# Patient Record
Sex: Female | Born: 1990 | Race: Black or African American | Hispanic: No | Marital: Single | Smoking: Never smoker
Health system: Southern US, Community
[De-identification: ages and names within clinical notes are randomized; demographics above are authoritative.]

## PROBLEM LIST (undated history)

## (undated) ENCOUNTER — Inpatient Hospital Stay (HOSPITAL_COMMUNITY): Payer: Self-pay

## (undated) DIAGNOSIS — D509 Iron deficiency anemia, unspecified: Secondary | ICD-10-CM

## (undated) DIAGNOSIS — R519 Headache, unspecified: Secondary | ICD-10-CM

## (undated) DIAGNOSIS — Z789 Other specified health status: Secondary | ICD-10-CM

## (undated) HISTORY — PX: NO PAST SURGERIES: SHX2092

---

## 2011-08-20 NOTE — L&D Delivery Note (Signed)
Delivery Note At 7:21 AM a viable and healthy female was delivered via Vaginal, Spontaneous Delivery (Presentation: ROA ).  APGAR: 9, 9; weight pending .   Placenta status: Intact, Spontaneous.  Cord:  with the following complications: .  Cord pH: n/a  Anesthesia: None  Episiotomy: None Lacerations: None Suture Repair: n/a Est. Blood Loss (mL): 250  Mom to postpartum.  Baby to nursery-stable.  [facility] 06/01/2012, 7:35 AM

## 2011-11-22 ENCOUNTER — Emergency Department (HOSPITAL_COMMUNITY)
Admission: EM | Admit: 2011-11-22 | Discharge: 2011-11-22 | Disposition: A | Discharge status: Home / Self Care | Payer: Medicaid Other | Attending: Emergency Medicine | Admitting: Emergency Medicine

## 2011-11-22 ENCOUNTER — Encounter (HOSPITAL_COMMUNITY): Payer: Self-pay | Admitting: *Deleted

## 2011-11-22 DIAGNOSIS — O2390 Unspecified genitourinary tract infection in pregnancy, unspecified trimester: Secondary | ICD-10-CM | POA: Insufficient documentation

## 2011-11-22 DIAGNOSIS — N390 Urinary tract infection, site not specified: Secondary | ICD-10-CM | POA: Insufficient documentation

## 2011-11-22 DIAGNOSIS — R112 Nausea with vomiting, unspecified: Secondary | ICD-10-CM | POA: Insufficient documentation

## 2011-11-22 LAB — URINE MICROSCOPIC-ADD ON

## 2011-11-22 LAB — URINALYSIS, ROUTINE W REFLEX MICROSCOPIC
Bilirubin Urine: NEGATIVE
Glucose, UA: NEGATIVE mg/dL
Hgb urine dipstick: NEGATIVE
Ketones, ur: NEGATIVE mg/dL
Nitrite: NEGATIVE
Protein, ur: NEGATIVE mg/dL
Specific Gravity, Urine: 1.014 (ref 1.005–1.030)
Urobilinogen, UA: 0.2 mg/dL (ref 0.0–1.0)
pH: 7.5 (ref 5.0–8.0)

## 2011-11-22 LAB — POCT PREGNANCY, URINE: Preg Test, Ur: POSITIVE — AB

## 2011-11-22 MED ORDER — NITROFURANTOIN MONOHYD MACRO 100 MG PO CAPS: 100.0000 mg | ORAL_CAPSULE | Freq: Two times a day (BID) | ORAL | Status: AC

## 2011-11-22 MED ORDER — ONDANSETRON 8 MG PO TBDP: 8.0000 mg | ORAL_TABLET | Freq: Three times a day (TID) | ORAL | Status: AC | PRN

## 2011-11-22 MED ORDER — SODIUM CHLORIDE 0.9 % IV SOLN
Freq: Once | INTRAVENOUS | Status: AC
  Administered 2011-11-22: 19:00:00 via INTRAVENOUS

## 2011-11-22 MED ORDER — ONDANSETRON HCL 4 MG/2ML IJ SOLN
4.0000 mg | Freq: Once | INTRAMUSCULAR | Status: AC
  Administered 2011-11-22: 4 mg via INTRAVENOUS
  Filled 2011-11-22: qty 2

## 2011-11-22 NOTE — ED Notes (Signed)
Nausea/vomiting since last night. No diarrhea

## 2011-11-22 NOTE — ED Notes (Signed)
Ns bolus started.

## 2011-11-22 NOTE — ED Notes (Signed)
Pt tolerated ginger ale with no nausea.  States she is ready to  Go home.

## 2011-11-22 NOTE — ED Provider Notes (Signed)
History     CSN: [id number]  Arrival date & time 11/22/11  1623   None     Chief Complaint  Patient presents with  . Emesis    (Consider location/radiation/quality/duration/timing/severity/associated sxs/prior treatment) HPI Comments: Patient reports that she is currently [redacted] weeks pregnant.  She comes in today with nausea and vomiting that began last evening. She denies diarrhea.  Denies any blood in her emesis or stool.  Her pregnancy has been uncomplicated.  She has an appointment scheduled with Gynecology on 11/25/11.  She is G2P1.  Her last pregnancy was uncomplicated.  She denies any abdominal pain, vaginal bleeding, leakage of fluid, or passage of vaginal tissue.  The history is provided by the patient.    Past Medical History  Diagnosis Date  . Pregnant state, incidental     History reviewed. No pertinent past surgical history.  History reviewed. No pertinent family history.  History  Substance Use Topics  . Smoking status: Never Smoker   . Smokeless tobacco: Not on file  . Alcohol Use: No    OB History    Grav Para Term Preterm Abortions TAB SAB Ect Mult Living                  Review of Systems  Constitutional: Negative for fever and chills.  Gastrointestinal: Positive for nausea and vomiting. Negative for abdominal pain, diarrhea and constipation.  Genitourinary: Negative for dysuria, frequency, hematuria, decreased urine volume, vaginal bleeding, vaginal discharge, vaginal pain and pelvic pain.    Allergies  Review of patient's allergies indicates no known allergies.  Home Medications   Current Outpatient Rx  Name Route Sig Dispense Refill  . PRENATAL VITAMIN PO Oral Take 1 tablet by mouth daily.      BP 129/72  Pulse 98  Temp(Src) 98.3 F (36.8 C) (Oral)  Resp 14  SpO2 100%  LMP 09/07/2011  Physical Exam  Nursing note and vitals reviewed. Constitutional: She appears well-developed and well-nourished. No distress.  HENT:  Head:  Normocephalic and atraumatic.  Cardiovascular: Normal rate, regular rhythm and normal heart sounds.   Pulmonary/Chest: Effort normal and breath sounds normal.  Abdominal: Soft. Bowel sounds are normal. She exhibits no distension and no mass. There is no tenderness. There is no rebound, no guarding and no CVA tenderness.  Neurological: She is alert.  Skin: Skin is warm and dry. She is not diaphoretic.    ED Course  Procedures (including critical care time)  Labs Reviewed  URINALYSIS, ROUTINE W REFLEX MICROSCOPIC - Abnormal; Notable for the following:    APPearance CLOUDY (*)    Leukocytes, UA LARGE (*)    All other components within normal limits  POCT PREGNANCY, URINE - Abnormal; Notable for the following:    Preg Test, Ur POSITIVE (*)    All other components within normal limits  URINE MICROSCOPIC-ADD ON - Abnormal; Notable for the following:    Squamous Epithelial / LPF MANY (*)    Bacteria, UA MANY (*)    All other components within normal limits   No results found.   No diagnosis found.  7:55 PM Patient able to tolerate po liquids after zofran administration.  MDM  Patient eleven weeks pregnant as evidenced by LMP comes in today with nausea and vomiting.  Patient able to tolerate po liquids after given Zofran.  No vaginal bleeding, vaginal discharge, abdominal pain, passage of tissue, or leakage of fluid. UA shows UTI.  Patient given Rx for antibiotics.   Patient afebrile.  No CVA tenderness.   Patient has an appt with OB/GYN scheduled in 3 days.          Pascal Lux [place], PA-C 11/23/11 [phone]

## 2011-11-25 NOTE — ED Provider Notes (Signed)
History/physical exam/procedure(s) were performed by non-physician practitioner and as supervising physician I was immediately available for consultation/collaboration. I have reviewed all notes and am in agreement with care and plan.   Hilario Quarry, MD 11/25/11 1325

## 2011-11-26 LAB — OB RESULTS CONSOLE HIV ANTIBODY (ROUTINE TESTING): HIV: NONREACTIVE

## 2011-11-26 LAB — OB RESULTS CONSOLE ABO/RH: RH Type: POSITIVE

## 2011-11-26 LAB — OB RESULTS CONSOLE ANTIBODY SCREEN: Antibody Screen: NEGATIVE

## 2011-11-26 LAB — OB RESULTS CONSOLE GC/CHLAMYDIA
Chlamydia: NEGATIVE
Gonorrhea: NEGATIVE

## 2011-11-26 LAB — OB RESULTS CONSOLE RPR: RPR: NONREACTIVE

## 2011-11-26 LAB — OB RESULTS CONSOLE RUBELLA ANTIBODY, IGM: Rubella: IMMUNE

## 2011-11-26 LAB — OB RESULTS CONSOLE HEPATITIS B SURFACE ANTIGEN: Hepatitis B Surface Ag: NEGATIVE

## 2012-01-21 LAB — OB RESULTS CONSOLE GC/CHLAMYDIA
Chlamydia: NEGATIVE
Gonorrhea: NEGATIVE

## 2012-02-04 ENCOUNTER — Encounter (HOSPITAL_COMMUNITY): Payer: Self-pay

## 2012-02-04 ENCOUNTER — Inpatient Hospital Stay (HOSPITAL_COMMUNITY): Payer: Medicaid Other

## 2012-02-04 ENCOUNTER — Inpatient Hospital Stay (HOSPITAL_COMMUNITY)
Admission: AD | Admit: 2012-02-04 | Discharge: 2012-02-04 | Disposition: A | Discharge status: Home / Self Care | Payer: Medicaid Other | Source: Ambulatory Visit | Attending: Obstetrics | Admitting: Obstetrics

## 2012-02-04 DIAGNOSIS — M545 Low back pain, unspecified: Secondary | ICD-10-CM | POA: Insufficient documentation

## 2012-02-04 DIAGNOSIS — N949 Unspecified condition associated with female genital organs and menstrual cycle: Secondary | ICD-10-CM

## 2012-02-04 DIAGNOSIS — O99891 Other specified diseases and conditions complicating pregnancy: Secondary | ICD-10-CM | POA: Insufficient documentation

## 2012-02-04 DIAGNOSIS — R109 Unspecified abdominal pain: Secondary | ICD-10-CM | POA: Insufficient documentation

## 2012-02-04 LAB — URINALYSIS, ROUTINE W REFLEX MICROSCOPIC
Bilirubin Urine: NEGATIVE
Glucose, UA: NEGATIVE mg/dL
Hgb urine dipstick: NEGATIVE
Ketones, ur: NEGATIVE mg/dL
Leukocytes, UA: NEGATIVE
Nitrite: NEGATIVE
Protein, ur: NEGATIVE mg/dL
Specific Gravity, Urine: 1.02 (ref 1.005–1.030)
Urobilinogen, UA: 0.2 mg/dL (ref 0.0–1.0)
pH: 7 (ref 5.0–8.0)

## 2012-02-04 MED ORDER — ACETAMINOPHEN 500 MG PO TABS
1000.0000 mg | ORAL_TABLET | Freq: Once | ORAL | Status: AC
  Administered 2012-02-04: 1000 mg via ORAL
  Filled 2012-02-04: qty 2

## 2012-02-04 NOTE — MAU Note (Signed)
Patient states she woke up with right lower abdominal cramping and low back pain. Denies any bleeding or discharge. Has felt fetal movement.

## 2012-02-04 NOTE — MAU Provider Note (Signed)
  History     CSN: [id number]  Arrival date and time: 02/04/12 1031   First Provider Initiated Contact with Patient 02/04/12 1129      Chief Complaint  Patient presents with  . Abdominal Pain  . Back Pain   HPI  Pt woke up this morning to a sharp pain in groin that radiates to lower back.  Denies vaginal bleeding, leaking of fluid, or contractions.  No nausea, vomiting, or diarrhea.  Pain is described as intermittent and rated 6/10.  Last appointment with Dr. Clearance Coots was on 01/21/2012.  Past Medical History  Diagnosis Date  . Pregnant state, incidental   . Herpes     History reviewed. No pertinent past surgical history.  History reviewed. No pertinent family history.  History  Substance Use Topics  . Smoking status: Never Smoker   . Smokeless tobacco: Never Used  . Alcohol Use: No    Allergies: No Known Allergies  Prescriptions prior to admission  Medication Sig Dispense Refill  . acetaminophen (TYLENOL) 325 MG tablet Take 650 mg by mouth daily as needed. For  pain      . cholecalciferol (VITAMIN D) 1000 UNITS tablet Take 1,000 Units by mouth daily.      . Prenatal Vit-Fe Fumarate-FA (PRENATAL MULTIVITAMIN) TABS Take 1 tablet by mouth daily.        Review of Systems  Constitutional: Negative for fever and chills.  Gastrointestinal: Positive for abdominal pain. Negative for nausea, vomiting, diarrhea and constipation.  Musculoskeletal: Positive for back pain.  All other systems reviewed and are negative.   Physical Exam   Blood pressure 106/62, pulse 83, temperature 98.5 F (36.9 C), temperature source Oral, resp. rate 16, height 5\' 3"  (1.6 m), weight 49.896 kg (110 lb), last menstrual period 09/07/2011, SpO2 100.00%.  Physical Exam  Constitutional: She is oriented to person, place, and time. She appears well-developed and well-nourished. No distress.  HENT:  Head: Normocephalic.  Neck: Normal range of motion. Neck supple.  Cardiovascular: Normal rate, regular  rhythm and normal heart sounds.   Respiratory: Effort normal and breath sounds normal.  GI: Soft. She exhibits no mass. There is tenderness (right lower outer quad). There is no rebound and no guarding.  Genitourinary: No bleeding around the vagina.       Cervix - closed  Neurological: She is alert and oriented to person, place, and time.  Skin: Skin is warm and dry.    MAU Course  Procedures    Results for orders placed during the hospital encounter of 02/04/12 (from the past 24 hour(s))  URINALYSIS, ROUTINE W REFLEX MICROSCOPIC     Status: Normal   Collection Time   02/04/12 11:00 AM      Component Value Range   Color, Urine YELLOW  YELLOW   APPearance CLEAR  CLEAR   Specific Gravity, Urine 1.020  1.005 - 1.030   pH 7.0  5.0 - 8.0   Glucose, UA NEGATIVE  NEGATIVE mg/dL   Hgb urine dipstick NEGATIVE  NEGATIVE   Bilirubin Urine NEGATIVE  NEGATIVE   Ketones, ur NEGATIVE  NEGATIVE mg/dL   Protein, ur NEGATIVE  NEGATIVE mg/dL   Urobilinogen, UA 0.2  0.0 - 1.0 mg/dL   Nitrite NEGATIVE  NEGATIVE   Leukocytes, UA NEGATIVE  NEGATIVE    Assessment and Plan  Round Ligament Pain  Plan: DC to home Reviewed labor precautions Call office for appt if symptoms return  [facility] 02/04/2012, 11:31 AM

## 2012-02-27 ENCOUNTER — Encounter (HOSPITAL_COMMUNITY): Payer: Self-pay

## 2012-02-27 ENCOUNTER — Inpatient Hospital Stay (HOSPITAL_COMMUNITY)
Admission: AD | Admit: 2012-02-27 | Discharge: 2012-02-27 | Disposition: A | Discharge status: Home / Self Care | Payer: Medicaid Other | Source: Ambulatory Visit | Attending: Obstetrics | Admitting: Obstetrics

## 2012-02-27 DIAGNOSIS — R109 Unspecified abdominal pain: Secondary | ICD-10-CM | POA: Insufficient documentation

## 2012-02-27 DIAGNOSIS — N949 Unspecified condition associated with female genital organs and menstrual cycle: Secondary | ICD-10-CM

## 2012-02-27 DIAGNOSIS — O99891 Other specified diseases and conditions complicating pregnancy: Secondary | ICD-10-CM | POA: Insufficient documentation

## 2012-02-27 LAB — URINALYSIS, ROUTINE W REFLEX MICROSCOPIC
Bilirubin Urine: NEGATIVE
Glucose, UA: NEGATIVE mg/dL
Hgb urine dipstick: NEGATIVE
Ketones, ur: NEGATIVE mg/dL
Nitrite: NEGATIVE
Protein, ur: NEGATIVE mg/dL
Specific Gravity, Urine: 1.015 (ref 1.005–1.030)
Urobilinogen, UA: 0.2 mg/dL (ref 0.0–1.0)
pH: 8 (ref 5.0–8.0)

## 2012-02-27 LAB — URINE MICROSCOPIC-ADD ON

## 2012-02-27 NOTE — MAU Note (Signed)
Pt states notes severe pain when she stands up to walk, and when walking or lying down. Denies pain when sitting upright. Denies uti s/s as well as bleeding or vaginal d/c changes.

## 2012-02-27 NOTE — Discharge Instructions (Signed)
Keep the scheduled appointment you have in the office.  Call your doctor if you have questions or concerns.  Return here as needed.  You may try warm baths and resting in a position of comfort and continue tylenol as needed.

## 2012-02-27 NOTE — MAU Provider Note (Signed)
History     CSN: [id number]  Arrival date and time: 02/27/12 5784   First Provider Initiated Contact with Patient 02/27/12 0818      Chief Complaint  Patient presents with  . Abdominal Pain   HPI  Nicole Howard 21 y.o. [redacted]w[redacted]d patient of Dr. Verdell Carmine presenting today for suprapubic abdominal pain onset last night.  States pain radiates from her groin and is worse with movement or lying down.  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  Took tylenol around 9-10 last night with minimal relief.  Did not call the office before coming in.     OB History    Grav Para Term Preterm Abortions TAB SAB Ect Mult Living   2 1 1       1       Past Medical History  Diagnosis Date  . Pregnant state, incidental   . Herpes     Past Surgical History  Procedure Date  . No past surgeries     Family History  Problem Relation Age of Onset  . Other Neg Hx     History  Substance Use Topics  . Smoking status: Never Smoker   . Smokeless tobacco: Never Used  . Alcohol Use: No    Allergies: No Known Allergies  Prescriptions prior to admission  Medication Sig Dispense Refill  . acetaminophen (TYLENOL) 325 MG tablet Take 650 mg by mouth daily as needed. For  pain      . cholecalciferol (VITAMIN D) 1000 UNITS tablet Take 1,000 Units by mouth daily.      . Prenatal Vit-Fe Fumarate-FA (PRENATAL MULTIVITAMIN) TABS Take 1 tablet by mouth daily.        Review of Systems  Constitutional: Negative.   HENT: Negative.   Eyes: Negative.   Respiratory: Negative.   Cardiovascular: Negative.   Gastrointestinal: Negative.   Genitourinary: Negative.        Denies bleeding, LOF, cramping or lower back pain.  No unusual vaginal discharge, odor or itching.  Musculoskeletal: Negative.   Skin: Negative.   Neurological: Negative.   Endo/Heme/Allergies: Negative.   Psychiatric/Behavioral: Negative.    Physical Exam   Blood pressure 107/59, pulse 80,  temperature 97.4 F (36.3 C), temperature source Oral, resp. rate 16, height 5\' 4"  (1.626 m), weight 51.891 kg (114 lb 6.4 oz), last menstrual period 09/07/2011.  Physical Exam  Constitutional: She is oriented to person, place, and time. She appears well-developed and well-nourished.  HENT:  Head: Normocephalic and atraumatic.  Neck: Normal range of motion. Neck supple.  Cardiovascular: Normal rate, regular rhythm, normal heart sounds and intact distal pulses.   Respiratory: Effort normal and breath sounds normal. No respiratory distress.  GI: Soft. Bowel sounds are normal.  Genitourinary: Vagina normal and uterus normal.       Fundus size appropriate for dates.  No abdominal rigidity or contractions noted.  Patient reports tenderness central over bladder with palpation.  Pain is better with bending of knees when recumbent.  Neg CVAT.    Musculoskeletal: Normal range of motion. She exhibits no edema and no tenderness.  Neurological: She is alert and oriented to person, place, and time.  Skin: Skin is warm and dry.  Psychiatric: She has a normal mood and affect. Her behavior is normal.    Bimanual exam: Cervix fingertip, but closed.   Uterus non tender, normal size Adnexa non tender, no masses bilaterally Chaperone present for exam.  MAU Course  Procedures  MDM  Results for orders placed during the hospital encounter of 02/27/12 (from the past 24 hour(s))  URINALYSIS, ROUTINE W REFLEX MICROSCOPIC     Status: Abnormal   Collection Time   02/27/12  7:35 AM      Component Value Range   Color, Urine YELLOW  YELLOW   APPearance CLOUDY (*) CLEAR   Specific Gravity, Urine 1.015  1.005 - 1.030   pH 8.0  5.0 - 8.0   Glucose, UA NEGATIVE  NEGATIVE mg/dL   Hgb urine dipstick NEGATIVE  NEGATIVE   Bilirubin Urine NEGATIVE  NEGATIVE   Ketones, ur NEGATIVE  NEGATIVE mg/dL   Protein, ur NEGATIVE  NEGATIVE mg/dL   Urobilinogen, UA 0.2  0.0 - 1.0 mg/dL   Nitrite NEGATIVE  NEGATIVE    Leukocytes, UA TRACE (*) NEGATIVE  URINE MICROSCOPIC-ADD ON     Status: Abnormal   Collection Time   02/27/12  7:35 AM      Component Value Range   Squamous Epithelial / LPF MANY (*) RARE   WBC, UA 3-6  <3 WBC/hpf   Bacteria, UA MANY (*) RARE    Discussed plan of care with Wynelle Bourgeois CNM.  Assessment and Plan   Assessment: Round Ligament Pain  Plan:  Continue tylenol as needed for discomfort. Can try warm baths for relief and rest as often as possible in a position of comfort.   Servando Salina 02/27/2012, 8:47 AM   Seen also by me. Agree with note.  Pt to followup with office as needed and as scheduled.

## 2012-02-27 NOTE — MAU Note (Signed)
Sharp pain in lower abd, started last night.  Happens when stands/walks or is laying down.  Had to sleep sitting up

## 2012-02-27 NOTE — MAU Note (Addendum)
Note entered on wrong pt.

## 2012-05-11 LAB — OB RESULTS CONSOLE GBS: GBS: NEGATIVE

## 2012-05-13 ENCOUNTER — Encounter (HOSPITAL_COMMUNITY): Payer: Self-pay | Admitting: *Deleted

## 2012-05-13 ENCOUNTER — Inpatient Hospital Stay (HOSPITAL_COMMUNITY)
Admission: AD | Admit: 2012-05-13 | Discharge: 2012-05-13 | Disposition: A | Discharge status: Home / Self Care | Payer: Medicaid Other | Source: Ambulatory Visit | Attending: Obstetrics & Gynecology | Admitting: Obstetrics & Gynecology

## 2012-05-13 DIAGNOSIS — O479 False labor, unspecified: Secondary | ICD-10-CM

## 2012-05-13 DIAGNOSIS — O4700 False labor before 37 completed weeks of gestation, unspecified trimester: Secondary | ICD-10-CM | POA: Insufficient documentation

## 2012-05-13 DIAGNOSIS — O26899 Other specified pregnancy related conditions, unspecified trimester: Secondary | ICD-10-CM

## 2012-05-13 DIAGNOSIS — R0602 Shortness of breath: Secondary | ICD-10-CM | POA: Insufficient documentation

## 2012-05-13 DIAGNOSIS — O99891 Other specified diseases and conditions complicating pregnancy: Secondary | ICD-10-CM | POA: Insufficient documentation

## 2012-05-13 LAB — URINALYSIS, ROUTINE W REFLEX MICROSCOPIC
Bilirubin Urine: NEGATIVE
Glucose, UA: 100 mg/dL — AB
Hgb urine dipstick: NEGATIVE
Ketones, ur: NEGATIVE mg/dL
Nitrite: NEGATIVE
Protein, ur: NEGATIVE mg/dL
Specific Gravity, Urine: 1.02 (ref 1.005–1.030)
Urobilinogen, UA: 0.2 mg/dL (ref 0.0–1.0)
pH: 6.5 (ref 5.0–8.0)

## 2012-05-13 LAB — URINE MICROSCOPIC-ADD ON

## 2012-05-13 NOTE — MAU Provider Note (Signed)
  History     CSN: [id number]  Arrival date and time: 05/13/12 1836   First Provider Initiated Contact with Patient 05/13/12 1946      Chief Complaint  Patient presents with  . Shortness of Breath  . Contractions   HPI Nicole Howard is 21 y.o. G2P1001 [redacted]w[redacted]d weeks presenting with shortness of breath that began at 2:30 when she got to work.  She works in home care.  Denies vaginal bleeding, discharge or loss of fluid.  Reports contractions that are irregular "around every 10 minutes".  Denies recent upper respiratory sxs or exposure to illness.  Denies hx of asthmaPatient appears comfortable, respirations are not labored.    Past Medical History  Diagnosis Date  . Pregnant state, incidental   . Herpes     Past Surgical History  Procedure Date  . No past surgeries     Family History  Problem Relation Age of Onset  . Other Neg Hx     History  Substance Use Topics  . Smoking status: Never Smoker   . Smokeless tobacco: Never Used  . Alcohol Use: No    Allergies: No Known Allergies  Prescriptions prior to admission  Medication Sig Dispense Refill  . acetaminophen (TYLENOL) 325 MG tablet Take 650 mg by mouth daily as needed. For  pain      . cholecalciferol (VITAMIN D) 1000 UNITS tablet Take 1,000 Units by mouth daily.      . Prenatal Vit-Fe Fumarate-FA (PRENATAL MULTIVITAMIN) TABS Take 1 tablet by mouth daily.        Review of Systems  Constitutional: Negative.  Negative for fever and chills.  HENT: Negative.   Respiratory: Positive for shortness of breath. Negative for cough, sputum production and wheezing.   Cardiovascular: Negative for chest pain.  Gastrointestinal: Positive for abdominal pain (contractions irregular). Negative for nausea and vomiting.  Genitourinary:       Neg for bleeding, loss of fluid or discharge.   Physical Exam   Blood pressure 110/64, pulse 79, temperature 98.3 F (36.8 C), temperature source Oral, resp. rate 22, height 5' 4.5"  (1.638 m), last menstrual period 09/07/2011.  Physical Exam  Constitutional: She is oriented to person, place, and time. She appears well-developed and well-nourished. No distress.  Cardiovascular: Normal rate.   Respiratory: Effort normal and breath sounds normal. No respiratory distress. She has no wheezes. She has no rales. She exhibits no tenderness.  GI: There is no tenderness.  Genitourinary:       Cervical exam by Leotis Shames, RN  -closed,50% and -2 station.  Neurological: She is alert and oriented to person, place, and time.  Skin: Skin is warm and dry.  Psychiatric: She has a normal mood and affect. Her behavior is normal.   FETAL MONITOR STRIP--  Baseline 130, moderate variables, reactive.  Contractions irregular 8-12 apart. MAU Course  Procedures  MDM  20:00  Reported HPI, MSE FMS findings and Cervical exam to Dr. Tamela Oddi.  May discharge to home.  Keep scheduled appointment Assessment and Plan  A:  Shortness of breath with normal exam and O2Sats       False labor      [redacted]w[redacted]d gestation  P:  Encouraged  Patient to keep scheduled appointment      Stay well hydrated.  Nicole Howard,EVE M 05/13/2012, 7:48 PM

## 2012-05-13 NOTE — Discharge Instructions (Signed)
Braxton Hicks Contractions Pregnancy is commonly associated with contractions of the uterus throughout the pregnancy. Towards the end of pregnancy (32 to 34 weeks), these contractions [facility]) can develop more often and may become more forceful. This is not true labor because these contractions do not result in opening (dilatation) and thinning of the cervix. They are sometimes difficult to tell apart from true labor because these contractions can be forceful and people have different pain tolerances. You should not feel embarrassed if you go to the hospital with false labor. Sometimes, the only way to tell if you are in true labor is for your caregiver to follow the changes in the cervix. How to tell the difference between true and false labor:  False labor.   The contractions of false labor are usually shorter, irregular and not as hard as those of true labor.   They are often felt in the front of the lower abdomen and in the groin.   They may leave with walking around or changing positions while lying down.   They get weaker and are shorter lasting as time goes on.   These contractions are usually irregular.   They do not usually become progressively stronger, regular and closer together as with true labor.   True labor.   Contractions in true labor last 30 to 70 seconds, become very regular, usually become more intense, and increase in frequency.   They do not go away with walking.   The discomfort is usually felt in the top of the uterus and spreads to the lower abdomen and low back.   True labor can be determined by your caregiver with an exam. This will show that the cervix is dilating and getting thinner.  If there are no prenatal problems or other health problems associated with the pregnancy, it is completely safe to be sent home with false labor and await the onset of true labor. HOME CARE INSTRUCTIONS   Keep up with your usual exercises and instructions.   Take  medications as directed.   Keep your regular prenatal appointment.   Eat and drink lightly if you think you are going into labor.   If BH contractions are making you uncomfortable:   Change your activity position from lying down or resting to walking/walking to resting.   Sit and rest in a tub of warm water.   Drink 2 to 3 glasses of water. Dehydration may cause B-H contractions.   Do slow and deep breathing several times an hour.  SEEK IMMEDIATE MEDICAL CARE IF:   Your contractions continue to become stronger, more regular, and closer together.   You have a gushing, burst or leaking of fluid from the vagina.   An oral temperature above 102 F (38.9 C) develops.   You have passage of blood-tinged mucus.   You develop vaginal bleeding.   You develop continuous belly (abdominal) pain.   You have low back pain that you never had before.   You feel the baby's head pushing down causing pelvic pressure.   The baby is not moving as much as it used to.  Document Released: 08/05/2005 Document Revised: 07/25/2011 Document Reviewed: 01/27/2009 [facility] Patient Information 2012 [place], [place].Braxton Hicks Contractions Pregnancy is commonly associated with contractions of the uterus throughout the pregnancy. Towards the end of pregnancy (32 to 34 weeks), these contractions [facility]) can develop more often and may become more forceful. This is not true labor because these contractions do not result in opening (dilatation)  and thinning of the cervix. They are sometimes difficult to tell apart from true labor because these contractions can be forceful and people have different pain tolerances. You should not feel embarrassed if you go to the hospital with false labor. Sometimes, the only way to tell if you are in true labor is for your caregiver to follow the changes in the cervix. How to tell the difference between true and false labor:  False labor.   The contractions of false  labor are usually shorter, irregular and not as hard as those of true labor.   They are often felt in the front of the lower abdomen and in the groin.   They may leave with walking around or changing positions while lying down.   They get weaker and are shorter lasting as time goes on.   These contractions are usually irregular.   They do not usually become progressively stronger, regular and closer together as with true labor.   True labor.   Contractions in true labor last 30 to 70 seconds, become very regular, usually become more intense, and increase in frequency.   They do not go away with walking.   The discomfort is usually felt in the top of the uterus and spreads to the lower abdomen and low back.   True labor can be determined by your caregiver with an exam. This will show that the cervix is dilating and getting thinner.  If there are no prenatal problems or other health problems associated with the pregnancy, it is completely safe to be sent home with false labor and await the onset of true labor. HOME CARE INSTRUCTIONS   Keep up with your usual exercises and instructions.   Take medications as directed.   Keep your regular prenatal appointment.   Eat and drink lightly if you think you are going into labor.   If BH contractions are making you uncomfortable:   Change your activity position from lying down or resting to walking/walking to resting.   Sit and rest in a tub of warm water.   Drink 2 to 3 glasses of water. Dehydration may cause B-H contractions.   Do slow and deep breathing several times an hour.  SEEK IMMEDIATE MEDICAL CARE IF:   Your contractions continue to become stronger, more regular, and closer together.   You have a gushing, burst or leaking of fluid from the vagina.   An oral temperature above 102 F (38.9 C) develops.   You have passage of blood-tinged mucus.   You develop vaginal bleeding.   You develop continuous belly (abdominal)  pain.   You have low back pain that you never had before.   You feel the baby's head pushing down causing pelvic pressure.   The baby is not moving as much as it used to.  Document Released: 08/05/2005 Document Revised: 07/25/2011 Document Reviewed: 01/27/2009 [facility] Patient Information 2012 [place], [place].Shortness of Breath Shortness of breath (dyspnea) is the feeling of uneasy breathing. Shortness of breath needs care right away. HOME CARE   Do not smoke.   Avoid being around chemicals that may bother your breathing (such as paint fumes or dust).   Rest as needed. Slowly begin your usual activities.   Only take medicine as told by your doctor. Inhaled medicines or oxygen might be part of your treatment.   Follow up with your doctor as told. Waiting to do so or failure to follow up could result in worsening your condition and possible disability  or death.   Understand what to do or who to call if your shortness of breath gets worse.  GET HELP RIGHT AWAY IF:   You get pain in your chest, shoulders, belly (abdomen), or jaw.   You cannot stop coughing or you start wheezing.   You cough up blood or thick mucus.   You can only speak with short words.   You have a fever.   You feel your heart racing or skipping beats.   You are not breathing better when you stop and rest.   Your condition does not improve in the time expected.   You have problems with medicines.  MAKE SURE YOU:  Understand these instructions.   Will watch your condition.   Will get help right away if you are not doing well or get worse.  Document Released: 01/22/2008 Document Revised: 07/25/2011 Document Reviewed: 06/21/2009 [facility] Patient Information 2012 [place], [place].

## 2012-05-13 NOTE — MAU Note (Signed)
Shortness of breath especially when moving and walking. Irregular contractions today. No vaginal bleeding.

## 2012-05-25 ENCOUNTER — Encounter (HOSPITAL_COMMUNITY): Payer: Self-pay | Admitting: *Deleted

## 2012-05-25 ENCOUNTER — Inpatient Hospital Stay (HOSPITAL_COMMUNITY)
Admission: AD | Admit: 2012-05-25 | Discharge: 2012-05-25 | Disposition: A | Discharge status: Home / Self Care | Payer: Medicaid Other | Source: Ambulatory Visit | Attending: Obstetrics | Admitting: Obstetrics

## 2012-05-25 DIAGNOSIS — O479 False labor, unspecified: Secondary | ICD-10-CM | POA: Insufficient documentation

## 2012-05-25 NOTE — MAU Note (Signed)
Pt G2 P1 at 37.2wks having contractions every 3-57min x 3 hrs.  Denies bleeding or leaking.

## 2012-06-01 ENCOUNTER — Inpatient Hospital Stay (HOSPITAL_COMMUNITY)
Admission: AD | Admit: 2012-06-01 | Discharge: 2012-06-03 | DRG: 775 | Disposition: A | Discharge status: Home / Self Care | Payer: Medicaid Other | Source: Ambulatory Visit | Attending: Obstetrics | Admitting: Obstetrics

## 2012-06-01 ENCOUNTER — Encounter (HOSPITAL_COMMUNITY): Payer: Self-pay | Admitting: *Deleted

## 2012-06-01 DIAGNOSIS — IMO0001 Reserved for inherently not codable concepts without codable children: Secondary | ICD-10-CM

## 2012-06-01 LAB — CBC
HCT: 33.2 % — ABNORMAL LOW (ref 36.0–46.0)
Hemoglobin: 10.9 g/dL — ABNORMAL LOW (ref 12.0–15.0)
MCH: 29.1 pg (ref 26.0–34.0)
MCHC: 32.8 g/dL (ref 30.0–36.0)
MCV: 88.8 fL (ref 78.0–100.0)
Platelets: 279 10*3/uL (ref 150–400)
RBC: 3.74 MIL/uL — ABNORMAL LOW (ref 3.87–5.11)
RDW: 13.5 % (ref 11.5–15.5)
WBC: 17.2 10*3/uL — ABNORMAL HIGH (ref 4.0–10.5)

## 2012-06-01 LAB — RPR: RPR Ser Ql: NONREACTIVE

## 2012-06-01 MED ORDER — SENNOSIDES-DOCUSATE SODIUM 8.6-50 MG PO TABS
2.0000 | ORAL_TABLET | Freq: Every day | ORAL | Status: DC
  Administered 2012-06-01 – 2012-06-02 (×2): 2 via ORAL

## 2012-06-01 MED ORDER — ZOLPIDEM TARTRATE 5 MG PO TABS: 5.0000 mg | ORAL_TABLET | Freq: Every evening | ORAL | Status: DC | PRN

## 2012-06-01 MED ORDER — LACTATED RINGERS IV SOLN
INTRAVENOUS | Status: DC
  Administered 2012-06-01: 07:00:00 via INTRAVENOUS

## 2012-06-01 MED ORDER — ACETAMINOPHEN 325 MG PO TABS: 650.0000 mg | ORAL_TABLET | ORAL | Status: DC | PRN

## 2012-06-01 MED ORDER — IBUPROFEN 600 MG PO TABS
600.0000 mg | ORAL_TABLET | Freq: Four times a day (QID) | ORAL | Status: DC
  Administered 2012-06-01 – 2012-06-03 (×9): 600 mg via ORAL
  Filled 2012-06-01 (×7): qty 1

## 2012-06-01 MED ORDER — OXYTOCIN 40 UNITS IN LACTATED RINGERS INFUSION - SIMPLE MED
INTRAVENOUS | Status: AC
  Administered 2012-06-01: 40 [IU] via INTRAVENOUS
  Filled 2012-06-01: qty 1000

## 2012-06-01 MED ORDER — OXYCODONE-ACETAMINOPHEN 5-325 MG PO TABS
1.0000 | ORAL_TABLET | ORAL | Status: DC | PRN
  Administered 2012-06-01: 1 via ORAL
  Filled 2012-06-01: qty 2

## 2012-06-01 MED ORDER — IBUPROFEN 600 MG PO TABS
600.0000 mg | ORAL_TABLET | Freq: Four times a day (QID) | ORAL | Status: DC | PRN
  Administered 2012-06-01: 600 mg via ORAL
  Filled 2012-06-01: qty 1

## 2012-06-01 MED ORDER — LANOLIN HYDROUS EX OINT: TOPICAL_OINTMENT | CUTANEOUS | Status: DC | PRN

## 2012-06-01 MED ORDER — LACTATED RINGERS IV SOLN: 500.0000 mL | INTRAVENOUS | Status: DC | PRN

## 2012-06-01 MED ORDER — FERROUS SULFATE 325 (65 FE) MG PO TABS
325.0000 mg | ORAL_TABLET | Freq: Two times a day (BID) | ORAL | Status: DC
  Administered 2012-06-01 – 2012-06-03 (×4): 325 mg via ORAL
  Filled 2012-06-01 (×4): qty 1

## 2012-06-01 MED ORDER — DIPHENHYDRAMINE HCL 25 MG PO CAPS: 25.0000 mg | ORAL_CAPSULE | Freq: Four times a day (QID) | ORAL | Status: DC | PRN

## 2012-06-01 MED ORDER — MAGNESIUM HYDROXIDE 400 MG/5ML PO SUSP: 30.0000 mL | ORAL | Status: DC | PRN

## 2012-06-01 MED ORDER — ONDANSETRON HCL 4 MG PO TABS: 4.0000 mg | ORAL_TABLET | ORAL | Status: DC | PRN

## 2012-06-01 MED ORDER — BENZOCAINE-MENTHOL 20-0.5 % EX AERO
1.0000 "application " | INHALATION_SPRAY | CUTANEOUS | Status: DC | PRN
  Filled 2012-06-01: qty 56

## 2012-06-01 MED ORDER — ONDANSETRON HCL 4 MG/2ML IJ SOLN: 4.0000 mg | Freq: Four times a day (QID) | INTRAMUSCULAR | Status: DC | PRN

## 2012-06-01 MED ORDER — BUTORPHANOL TARTRATE 1 MG/ML IJ SOLN: 1.0000 mg | INTRAMUSCULAR | Status: DC | PRN

## 2012-06-01 MED ORDER — CITRIC ACID-SODIUM CITRATE 334-500 MG/5ML PO SOLN: 30.0000 mL | ORAL | Status: DC | PRN

## 2012-06-01 MED ORDER — TETANUS-DIPHTH-ACELL PERTUSSIS 5-2.5-18.5 LF-MCG/0.5 IM SUSP
0.5000 mL | Freq: Once | INTRAMUSCULAR | Status: AC
  Administered 2012-06-02: 0.5 mL via INTRAMUSCULAR
  Filled 2012-06-01: qty 0.5

## 2012-06-01 MED ORDER — LIDOCAINE HCL (PF) 1 % IJ SOLN
INTRAMUSCULAR | Status: AC
  Filled 2012-06-01: qty 30

## 2012-06-01 MED ORDER — PRENATAL MULTIVITAMIN CH
1.0000 | ORAL_TABLET | Freq: Every day | ORAL | Status: DC
  Administered 2012-06-01 – 2012-06-03 (×3): 1 via ORAL
  Filled 2012-06-01 (×2): qty 1

## 2012-06-01 MED ORDER — DIBUCAINE 1 % RE OINT: 1.0000 "application " | TOPICAL_OINTMENT | RECTAL | Status: DC | PRN

## 2012-06-01 MED ORDER — ONDANSETRON HCL 4 MG/2ML IJ SOLN: 4.0000 mg | INTRAMUSCULAR | Status: DC | PRN

## 2012-06-01 MED ORDER — MEASLES, MUMPS & RUBELLA VAC ~~LOC~~ INJ: 0.5000 mL | INJECTION | Freq: Once | SUBCUTANEOUS | Status: DC

## 2012-06-01 MED ORDER — LIDOCAINE HCL (PF) 1 % IJ SOLN
30.0000 mL | INTRAMUSCULAR | Status: DC | PRN
  Filled 2012-06-01: qty 30

## 2012-06-01 MED ORDER — WITCH HAZEL-GLYCERIN EX PADS: 1.0000 "application " | MEDICATED_PAD | CUTANEOUS | Status: DC | PRN

## 2012-06-01 MED ORDER — OXYCODONE-ACETAMINOPHEN 5-325 MG PO TABS
1.0000 | ORAL_TABLET | ORAL | Status: DC | PRN
  Administered 2012-06-01 – 2012-06-02 (×4): 1 via ORAL
  Filled 2012-06-01 (×4): qty 1

## 2012-06-01 MED ORDER — OXYTOCIN 40 UNITS IN LACTATED RINGERS INFUSION - SIMPLE MED
62.5000 mL/h | Freq: Once | INTRAVENOUS | Status: AC
  Administered 2012-06-01: 40 [IU] via INTRAVENOUS

## 2012-06-01 MED ORDER — OXYTOCIN BOLUS FROM INFUSION
500.0000 mL | Freq: Once | INTRAVENOUS | Status: DC
  Filled 2012-06-01: qty 500

## 2012-06-01 NOTE — Progress Notes (Signed)
UR chart review completed.  

## 2012-06-01 NOTE — H&P (Signed)
Milaun Beams is a 21 y.o. female presenting for contractions. Maternal Medical History:  Reason for admission: Reason for admission: contractions.  Contractions: Frequency: regular.   Perceived severity is strong.    Fetal activity: Perceived fetal activity is normal.    Prenatal complications: no prenatal complications   OB History    Grav Para Term Preterm Abortions TAB SAB Ect Mult Living   2 1 1       1      Past Medical History  Diagnosis Date  . Pregnant state, incidental   . Herpes    Past Surgical History  Procedure Date  . No past surgeries    Family History: family history is negative for Other. Social History:  reports that she has never smoked. She has never used smokeless tobacco. She reports that she does not drink alcohol or use illicit drugs.    Review of Systems  Constitutional: Negative for fever.  Eyes: Negative for blurred vision.  Respiratory: Negative for shortness of breath.   Gastrointestinal: Negative for vomiting.  Skin: Negative for rash.  Neurological: Negative for headaches.    Dilation: 6 Effacement (%): 80 Station: -1 Exam by:: B Mosca Blood pressure 119/71, pulse 70, temperature 97.5 F (36.4 C), temperature source Oral, resp. rate 16, height 5\' 4"  (1.626 m), weight 57.335 kg (126 lb 6.4 oz), last menstrual period 09/07/2011. Maternal Exam:  Uterine Assessment: Contraction frequency is regular.   Abdomen: Fetal presentation: vertex  Introitus: not evaluated.   Cervix: Cervix evaluated by digital exam.     Fetal Exam Fetal Monitor Review: Variability: moderate (6-25 bpm).   Pattern: no decelerations.    Fetal State Assessment: Category I - tracings are normal.     Physical Exam  Constitutional: She appears well-developed.  HENT:  Head: Normocephalic.  Neck: Neck supple. No thyromegaly present.  Cardiovascular: Normal rate and regular rhythm.   Respiratory: Breath sounds normal.  GI: Soft. Bowel sounds are normal.    Skin: No rash noted.    Prenatal labs: ABO, Rh: O/Positive/-- (04/09 0000) Antibody: Negative (04/09 0000) Rubella: Immune (04/09 0000) RPR: Nonreactive (04/09 0000)  HBsAg: Negative (04/09 0000)  HIV: Non-reactive (04/09 0000)  GBS: Negative (09/23 0000)   Assessment/Plan: Multipara at term, active labor, Category 1 FHT Admit, anticipate an NSVD   JACKSON-MOORE,Nastasia Kage A 06/01/2012, 7:57 AM

## 2012-06-02 NOTE — Progress Notes (Signed)
Post Partum Day 1 Subjective: no complaints  Objective: Blood pressure 99/65, pulse 73, temperature 98.2 F (36.8 C), temperature source Oral, resp. rate 18, height 5\' 4"  (1.626 m), weight 57.335 kg (126 lb 6.4 oz), last menstrual period 09/07/2011, SpO2 97.00%, unknown if currently breastfeeding.  Physical Exam:  General: alert and no distress Lochia: appropriate Uterine Fundus: firm Incision: none DVT Evaluation: No evidence of DVT seen on physical exam.   Basename 06/01/12 0704  HGB 10.9*  HCT 33.2*    Assessment/Plan: Plan for discharge tomorrow   LOS: 1 day   Lyan Moyano A 06/02/2012, 8:32 AM

## 2012-06-03 MED ORDER — IBUPROFEN 600 MG PO TABS: 600.0000 mg | ORAL_TABLET | Freq: Four times a day (QID) | ORAL | Status: DC

## 2012-06-03 MED ORDER — OXYCODONE-ACETAMINOPHEN 5-325 MG PO TABS: 1.0000 | ORAL_TABLET | ORAL | Status: DC | PRN

## 2012-06-03 NOTE — Discharge Summary (Signed)
Obstetric Discharge Summary Reason for Admission: onset of labor Prenatal Procedures: ultrasound Intrapartum Procedures: spontaneous vaginal delivery Postpartum Procedures: none Complications-Operative and Postpartum: none Hemoglobin  Date Value Range Status  06/01/2012 10.9* 12.0 - 15.0 g/dL Final     HCT  Date Value Range Status  06/01/2012 33.2* 36.0 - 46.0 % Final    Physical Exam:  General: alert and no distress Lochia: appropriate Uterine Fundus: firm Incision: none DVT Evaluation: No evidence of DVT seen on physical exam.  Discharge Diagnoses: Term Pregnancy-delivered  Discharge Information: Date: 06/03/2012 Activity: pelvic rest Diet: routine Medications: PNV, Ibuprofen, Colace and Percocet Condition: stable Instructions: refer to practice specific booklet Discharge to: home Follow-up Information    Follow up with HARPER,CHARLES A, MD. Schedule an appointment as soon as possible for a visit in 6 weeks. ([facility] list calling)    Contact information:   [address] [place] [address] [phone]          Newborn Data: Live born female  Birth Weight: 6 lb 4 oz (2835 g) APGAR: 9, 9  Home with mother.  HARPER,CHARLES A 06/03/2012, 6:23 AM

## 2012-06-03 NOTE — Progress Notes (Signed)
Post Partum Day 2 Subjective: no complaints  Objective: Blood pressure 104/65, pulse 106, temperature 97.3 F (36.3 C), temperature source Oral, resp. rate 20, height 5\' 4"  (1.626 m), weight 57.335 kg (126 lb 6.4 oz), last menstrual period 09/07/2011, SpO2 97.00%, unknown if currently breastfeeding.  Physical Exam:  General: alert and no distress Lochia: appropriate Uterine Fundus: firm Incision: none DVT Evaluation: No evidence of DVT seen on physical exam.   Basename 06/01/12 0704  HGB 10.9*  HCT 33.2*    Assessment/Plan: Discharge home   LOS: 2 days   Kayra Crowell A 06/03/2012, 6:18 AM

## 2013-05-11 ENCOUNTER — Emergency Department (HOSPITAL_COMMUNITY)
Admission: EM | Admit: 2013-05-11 | Discharge: 2013-05-11 | Disposition: A | Discharge status: Home / Self Care | Payer: Managed Care, Other (non HMO) | Attending: Emergency Medicine | Admitting: Emergency Medicine

## 2013-05-11 ENCOUNTER — Encounter (HOSPITAL_COMMUNITY): Payer: Self-pay | Admitting: *Deleted

## 2013-05-11 DIAGNOSIS — J020 Streptococcal pharyngitis: Secondary | ICD-10-CM

## 2013-05-11 DIAGNOSIS — H9209 Otalgia, unspecified ear: Secondary | ICD-10-CM | POA: Insufficient documentation

## 2013-05-11 DIAGNOSIS — Z8619 Personal history of other infectious and parasitic diseases: Secondary | ICD-10-CM | POA: Insufficient documentation

## 2013-05-11 LAB — RAPID STREP SCREEN (MED CTR MEBANE ONLY): Streptococcus, Group A Screen (Direct): POSITIVE — AB

## 2013-05-11 MED ORDER — PENICILLIN G BENZATHINE 1200000 UNIT/2ML IM SUSP
1.2000 10*6.[IU] | Freq: Once | INTRAMUSCULAR | Status: AC
  Administered 2013-05-11: 1.2 10*6.[IU] via INTRAMUSCULAR
  Filled 2013-05-11: qty 2

## 2013-05-11 NOTE — ED Provider Notes (Signed)
CSN: [id number]     Arrival date & time 05/11/13  1653 History  This chart was scribed for Felicie Morn, NP, working with Juliet Rude. Rubin Payor, MD, by Allene Dillon, ED Scribe. This patient was seen in room TR09C/TR09C and the patient's care was started at 5:55 PM.    Chief Complaint  Patient presents with  . Cough  . Sore Throat  . Otalgia    Patient is a 22 y.o. female presenting with cough and pharyngitis. The history is provided by the patient. No language interpreter was used.  Cough Cough characteristics:  Unable to specify Severity:  Moderate Onset quality:  Gradual Duration:  2 days Timing:  Constant Progression:  Unchanged Chronicity:  New Smoker: no   Relieved by:  Nothing Worsened by:  Nothing tried Associated symptoms: ear pain and sore throat   Associated symptoms: no chills   Sore Throat This is a new problem. The current episode started 2 days ago. The problem occurs constantly. The problem has been gradually worsening. Nothing aggravates the symptoms. Nothing relieves the symptoms. She has tried acetaminophen for the symptoms. The treatment provided no relief.   HPI Comments: Nicole Howard is a 22 y.o. female who presents to the Emergency Department complaining of constant, gradually worsening sore throat which began 2 days ago. Pt has associated cough.  Pt has taken tylenol with no relief. Pt denies nausea, emesis, abdominal pain or any other symptoms.   PCP- None.  Insurance- None  Past Medical History  Diagnosis Date  . Pregnant state, incidental   . Herpes    Past Surgical History  Procedure Laterality Date  . No past surgeries     Family History  Problem Relation Age of Onset  . Other Neg Hx    History  Substance Use Topics  . Smoking status: Never Smoker   . Smokeless tobacco: Never Used  . Alcohol Use: No   OB History   Grav Para Term Preterm Abortions TAB SAB Ect Mult Living   2 2 2       2      Review of Systems  Constitutional:  Negative for chills.  HENT: Positive for ear pain and sore throat.   Respiratory: Positive for cough.   Gastrointestinal: Negative for nausea and vomiting.  All other systems reviewed and are negative.    Allergies  Review of patient's allergies indicates no known allergies.  Home Medications   Current Outpatient Rx  Name  Route  Sig  Dispense  Refill  . acetaminophen (TYLENOL) 500 MG tablet   Oral   Take 1,000 mg by mouth every 6 (six) hours as needed for pain.          Triage Vitals: BP 106/52  Pulse 118  Temp(Src) 99.2 F (37.3 C) (Oral)  Resp 18  SpO2 100%  LMP 04/24/2013 Physical Exam  Nursing note and vitals reviewed. Constitutional: She is oriented to person, place, and time. She appears well-developed and well-nourished.  HENT:  Head: Normocephalic.  Right Ear: Tympanic membrane normal.  Left Ear: Tympanic membrane normal.  Pharyngeal erythema and edema.  Tonsillar exudate.  Eyes: EOM are normal.  Neck: Normal range of motion.  Cardiovascular: Normal rate, regular rhythm and normal heart sounds.   Pulmonary/Chest: Effort normal and breath sounds normal.  Abdominal: Soft. She exhibits no distension.  Musculoskeletal: Normal range of motion.  Lymphadenopathy:    She has cervical adenopathy.  Neurological: She is alert and oriented to person, place, and time.  Skin:  Skin is warm and dry.  Psychiatric: She has a normal mood and affect.    ED Course  Procedures (including critical care time)  DIAGNOSTIC STUDIES: Oxygen Saturation is 100% on RA, normal by my interpretation.    COORDINATION OF CARE: 5:59 PM- Discussed positive strep findings. Pt given options for oral antibiotics or IM injection of penicillin. Patient chooses IM medication. Pt advised of plan for treatment and pt agrees.  Medications  penicillin g benzathine (BICILLIN LA) 1200000 UNIT/2ML injection 1.2 Million Units (not administered)     Labs Review Labs Reviewed  RAPID STREP SCREEN  - Abnormal; Notable for the following:    Streptococcus, Group A Screen (Direct) POSITIVE (*)    All other components within normal limits   Imaging Review No results found.  MDM   1. Strep pharyngitis    I personally performed the services described in this documentation, which was scribed in my presence. The recorded information has been reviewed and is accurate.       Jimmye Norman, NP 05/11/13 [phone]

## 2013-05-11 NOTE — Discharge Instructions (Signed)
Strep Throat  Strep throat is an infection of the throat caused by a bacteria named Streptococcus pyogenes. Your caregiver may call the infection streptococcal "tonsillitis" or "pharyngitis" depending on whether there are signs of inflammation in the tonsils or back of the throat. Strep throat is most common in children aged 24 15 years during the cold months of the year, but it can occur in people of any age during any season. This infection is spread from person to person (contagious) through coughing, sneezing, or other close contact.  SYMPTOMS   · Fever or chills.  · Painful, swollen, red tonsils or throat.  · Pain or difficulty when swallowing.  · White or yellow spots on the tonsils or throat.  · Swollen, tender lymph nodes or "glands" of the neck or under the jaw.  · Red rash all over the body (rare).  DIAGNOSIS   Many different infections can cause the same symptoms. A test must be done to confirm the diagnosis so the right treatment can be given. A "rapid strep test" can help your caregiver make the diagnosis in a few minutes. If this test is not available, a light swab of the infected area can be used for a throat culture test. If a throat culture test is done, results are usually available in a day or two.  TREATMENT   Strep throat is treated with antibiotic medicine.  HOME CARE INSTRUCTIONS   · Gargle with 1 tsp of salt in 1 cup of warm water, 3 4 times per day or as needed for comfort.  · Family members who also have a sore throat or fever should be tested for strep throat and treated with antibiotics if they have the strep infection.  · Make sure everyone in your household washes their hands well.  · Do not share food, drinking cups, or personal items that could cause the infection to spread to others.  · You may need to eat a soft food diet until your sore throat gets better.  · Drink enough water and fluids to keep your urine clear or pale yellow. This will help prevent dehydration.  · Get plenty of  rest.  · Stay home from school, daycare, or work until you have been on antibiotics for 24 hours.  · Only take over-the-counter or prescription medicines for pain, discomfort, or fever as directed by your caregiver.  · If antibiotics are prescribed, take them as directed. Finish them even if you start to feel better.  SEEK MEDICAL CARE IF:   · The glands in your neck continue to enlarge.  · You develop a rash, cough, or earache.  · You cough up green, yellow-brown, or bloody sputum.  · You have pain or discomfort not controlled by medicines.  · Your problems seem to be getting worse rather than better.  SEEK IMMEDIATE MEDICAL CARE IF:   · You develop any new symptoms such as vomiting, severe headache, stiff or painful neck, chest pain, shortness of breath, or trouble swallowing.  · You develop severe throat pain, drooling, or changes in your voice.  · You develop swelling of the neck, or the skin on the neck becomes red and tender.  · You have a fever.  · You develop signs of dehydration, such as fatigue, dry mouth, and decreased urination.  · You become increasingly sleepy, or you cannot wake up completely.  Document Released: 08/02/2000 Document Revised: 07/22/2012 Document Reviewed: 10/04/2010  [employer]® Patient Information ©2014 [employer].

## 2013-05-11 NOTE — ED Notes (Signed)
Pt is here with cough, sore throat, and right ear pain since yesterday.  No fever

## 2013-05-11 NOTE — ED Notes (Signed)
Pt c/o sore throat, back and bilateral leg pain starting last night. Pt also reports her Right ear feels "stopped up" after washing her hair on Sunday

## 2013-05-12 NOTE — ED Provider Notes (Signed)
Medical screening examination/treatment/procedure(s) were performed by non-physician practitioner and as supervising physician I was immediately available for consultation/collaboration.  Eliannah Hinde R. Ibtisam Benge, MD 05/12/13 0017 

## 2013-08-05 ENCOUNTER — Emergency Department (HOSPITAL_COMMUNITY)
Admission: EM | Admit: 2013-08-05 | Discharge: 2013-08-05 | Disposition: A | Discharge status: Home / Self Care | Payer: Managed Care, Other (non HMO) | Attending: Emergency Medicine | Admitting: Emergency Medicine

## 2013-08-05 ENCOUNTER — Encounter (HOSPITAL_COMMUNITY): Payer: Self-pay | Admitting: Emergency Medicine

## 2013-08-05 DIAGNOSIS — Z8619 Personal history of other infectious and parasitic diseases: Secondary | ICD-10-CM | POA: Insufficient documentation

## 2013-08-05 DIAGNOSIS — R52 Pain, unspecified: Secondary | ICD-10-CM | POA: Insufficient documentation

## 2013-08-05 DIAGNOSIS — J020 Streptococcal pharyngitis: Secondary | ICD-10-CM

## 2013-08-05 LAB — RAPID STREP SCREEN (MED CTR MEBANE ONLY): Streptococcus, Group A Screen (Direct): POSITIVE — AB

## 2013-08-05 MED ORDER — AMOXICILLIN 500 MG PO CAPS: 500.0000 mg | ORAL_CAPSULE | Freq: Three times a day (TID) | ORAL | Status: DC

## 2013-08-05 MED ORDER — PENICILLIN G BENZATHINE 1200000 UNIT/2ML IM SUSP
1.2000 10*6.[IU] | Freq: Once | INTRAMUSCULAR | Status: AC
  Administered 2013-08-05: 1.2 10*6.[IU] via INTRAMUSCULAR
  Filled 2013-08-05 (×2): qty 2

## 2013-08-05 MED ORDER — HYDROCODONE-ACETAMINOPHEN 7.5-325 MG/15ML PO SOLN: 15.0000 mL | Freq: Four times a day (QID) | ORAL | Status: DC | PRN

## 2013-08-05 NOTE — ED Notes (Signed)
Pt is c/o sore throat, body aches, coughing, and difficulty swallowing  Pt states sxs started yesterday morning

## 2013-08-05 NOTE — ED Provider Notes (Signed)
CSN: [id number]     Arrival date & time 08/05/13  4098 History   First MD Initiated Contact with Patient 08/05/13 0449     Chief Complaint  Patient presents with  . Sore Throat  . Generalized Body Aches   (Consider location/radiation/quality/duration/timing/severity/associated sxs/prior Treatment) Patient is a 22 y.o. female presenting with pharyngitis. The history is provided by the patient. No language interpreter was used.  Sore Throat This is a new problem. The current episode started yesterday. The problem occurs constantly. The problem has been gradually worsening. Associated symptoms include coughing, myalgias, a sore throat and swollen glands. Pertinent negatives include no congestion, fever, nausea, neck pain, rash or vomiting. Associated symptoms comments: No drooling, inability to swallow, shortness of breath, neck pain/stiffness, or wheezing.. The symptoms are aggravated by swallowing, drinking and eating. Treatments tried: OTC "sore throat medicine" The treatment provided no relief.    Past Medical History  Diagnosis Date  . Pregnant state, incidental   . Herpes    Past Surgical History  Procedure Laterality Date  . No past surgeries     Family History  Problem Relation Age of Onset  . Adopted: Yes  . Other Neg Hx    History  Substance Use Topics  . Smoking status: Never Smoker   . Smokeless tobacco: Never Used  . Alcohol Use: No   OB History   Grav Para Term Preterm Abortions TAB SAB Ect Mult Living   2 2 2       2      Review of Systems  Constitutional: Negative for fever.  HENT: Positive for sore throat. Negative for congestion and drooling.   Respiratory: Positive for cough. Negative for shortness of breath.   Gastrointestinal: Negative for nausea and vomiting.  Musculoskeletal: Positive for myalgias. Negative for neck pain.  Skin: Negative for rash.  All other systems reviewed and are negative.    Allergies  Review of patient's allergies indicates  no known allergies.  Home Medications   Current Outpatient Rx  Name  Route  Sig  Dispense  Refill  . etonogestrel (IMPLANON) 68 MG IMPL implant   Subcutaneous   Inject 1 each into the skin once.         Marland Kitchen amoxicillin (AMOXIL) 500 MG capsule   Oral   Take 1 capsule (500 mg total) by mouth 3 (three) times daily. Start on 08/08/13 if no improvement in symptoms   30 capsule   0   . HYDROcodone-acetaminophen (HYCET) 7.5-325 mg/15 ml solution   Oral   Take 15 mLs by mouth 4 (four) times daily as needed for moderate pain.   120 mL   0    BP 127/77  Pulse 71  Temp(Src) 98.3 F (36.8 C) (Oral)  Resp 14  SpO2 100%  LMP 07/18/2013  Physical Exam  Nursing note and vitals reviewed. Constitutional: She is oriented to person, place, and time. She appears well-developed and well-nourished. No distress.  HENT:  Head: Normocephalic and atraumatic.  Right Ear: External ear normal.  Left Ear: External ear normal.  Mouth/Throat: Uvula is midline and mucous membranes are normal. No oral lesions. No trismus in the jaw. No uvula swelling. Posterior oropharyngeal erythema present. No oropharyngeal exudate, posterior oropharyngeal edema or tonsillar abscesses.  Positive bilateral tonsillar enlargement and erythema. No exudates appreciated. Airway patent and patient tolerating secretions without difficulty or drooling.  Eyes: Conjunctivae and EOM are normal. Pupils are equal, round, and reactive to light. No scleral icterus.  Neck: Normal range  of motion. Neck supple.  Patient moves neck with ease; no nuchal rigidity or meningismus.  Cardiovascular: Normal rate, regular rhythm and normal heart sounds.   Pulmonary/Chest: Effort normal. No stridor. No respiratory distress. She has no wheezes. She has no rales.  Abdominal: Soft. She exhibits no distension. There is no tenderness. There is no rebound.  Musculoskeletal: Normal range of motion.  Lymphadenopathy:    She has cervical adenopathy.   Neurological: She is alert and oriented to person, place, and time.  Skin: Skin is warm and dry. No rash noted. She is not diaphoretic. No erythema. No pallor.  Psychiatric: She has a normal mood and affect. Her behavior is normal.    ED Course  Procedures (including critical care time) Labs Review Labs Reviewed  RAPID STREP SCREEN - Abnormal; Notable for the following:    Streptococcus, Group A Screen (Direct) POSITIVE (*)    All other components within normal limits   Imaging Review No results found.  EKG Interpretation   None       MDM   1. Strep pharyngitis    Uncomplicated strep pharyngitis. Patient well and nontoxic appearing, hemodynamically stable, and afebrile. No nuchal rigidity or meningeal signs. Uvula midline without evidence of peritonsillar abscess. Patient tolerating secretions without difficulty. Rapid strep screen today positive. Patient treated in ED with IM Bicillin for strep pharyngitis. She is stable for discharge with prescription for high set as well as amoxicillin should symptoms not begin to improve within 48 hours. Return precautions discussed and patient agreeable to plan with no unaddressed concerns.   Filed Vitals:   08/05/13 0343 08/05/13 0554  BP: 127/77 117/59  Pulse: 71 66  Temp: 98.3 F (36.8 C) 98.8 F (37.1 C)  TempSrc: Oral Oral  Resp: 14 16  SpO2: 100% 99%     Antony Madura, PA-C 08/07/13 2242

## 2013-08-05 NOTE — Discharge Instructions (Signed)
Recommend salt water gargles and tylenol/ibuprofen for pain control. You may use Hycet as needed for sore throat. Start taking Amoxicillin as prescribed on 08/08/13 if no improvement in symptoms. Return if symptoms worsen.  Strep Throat Strep throat is an infection of the throat caused by a bacteria named Streptococcus pyogenes. Your caregiver may call the infection streptococcal "tonsillitis" or "pharyngitis" depending on whether there are signs of inflammation in the tonsils or back of the throat. Strep throat is most common in children aged 5 15 years during the cold months of the year, but it can occur in people of any age during any season. This infection is spread from person to person (contagious) through coughing, sneezing, or other close contact. SYMPTOMS   Fever or chills.  Painful, swollen, red tonsils or throat.  Pain or difficulty when swallowing.  White or yellow spots on the tonsils or throat.  Swollen, tender lymph nodes or "glands" of the neck or under the jaw.  Red rash all over the body (rare). DIAGNOSIS  Many different infections can cause the same symptoms. A test must be done to confirm the diagnosis so the right treatment can be given. A "rapid strep test" can help your caregiver make the diagnosis in a few minutes. If this test is not available, a light swab of the infected area can be used for a throat culture test. If a throat culture test is done, results are usually available in a day or two. TREATMENT  Strep throat is treated with antibiotic medicine. HOME CARE INSTRUCTIONS   Gargle with 1 tsp of salt in 1 cup of warm water, 3 4 times per day or as needed for comfort.  Family members who also have a sore throat or fever should be tested for strep throat and treated with antibiotics if they have the strep infection.  Make sure everyone in your household washes their hands well.  Do not share food, drinking cups, or personal items that could cause the infection  to spread to others.  You may need to eat a soft food diet until your sore throat gets better.  Drink enough water and fluids to keep your urine clear or pale yellow. This will help prevent dehydration.  Get plenty of rest.  Stay home from school, daycare, or work until you have been on antibiotics for 24 hours.  Only take over-the-counter or prescription medicines for pain, discomfort, or fever as directed by your caregiver.  If antibiotics are prescribed, take them as directed. Finish them even if you start to feel better. SEEK MEDICAL CARE IF:   The glands in your neck continue to enlarge.  You develop a rash, cough, or earache.  You cough up green, yellow-brown, or bloody sputum.  You have pain or discomfort not controlled by medicines.  Your problems seem to be getting worse rather than better. SEEK IMMEDIATE MEDICAL CARE IF:   You develop any new symptoms such as vomiting, severe headache, stiff or painful neck, chest pain, shortness of breath, or trouble swallowing.  You develop severe throat pain, drooling, or changes in your voice.  You develop swelling of the neck, or the skin on the neck becomes red and tender.  You have a fever.  You develop signs of dehydration, such as fatigue, dry mouth, and decreased urination.  You become increasingly sleepy, or you cannot wake up completely. Document Released: 08/02/2000 Document Revised: 07/22/2012 Document Reviewed: 10/04/2010 [facility] Patient Information 2014 [place], [place]. Salt Water Gargle This solution will  help make your mouth and throat feel better. HOME CARE INSTRUCTIONS   Mix 1 teaspoon of salt in 8 ounces of warm water.  Gargle with this solution as much or often as you need or as directed. Swish and gargle gently if you have any sores or wounds in your mouth.  Do not swallow this mixture. Document Released: 05/09/2004 Document Revised: 10/28/2011 Document Reviewed: 09/30/2008 [facility] Patient  Information 2014 [place], [place].

## 2013-08-05 NOTE — ED Notes (Signed)
Pt is A&O x3.  C/o sore throat 7/10 reports that is hurts to swallow.

## 2013-08-06 NOTE — ED Provider Notes (Signed)
Medical screening examination/treatment/procedure(s) were performed by non-physician practitioner and as supervising physician I was immediately available for consultation/collaboration.  Hurman Horn, MD 08/06/13 2028

## 2013-08-09 NOTE — ED Provider Notes (Signed)
Medical screening examination/treatment/procedure(s) were performed by non-physician practitioner and as supervising physician I was immediately available for consultation/collaboration.  Hurman Horn, MD 08/09/13 [phone]

## 2013-10-24 ENCOUNTER — Encounter (HOSPITAL_COMMUNITY): Payer: Self-pay | Admitting: Emergency Medicine

## 2013-10-24 ENCOUNTER — Emergency Department (HOSPITAL_COMMUNITY)
Admission: EM | Admit: 2013-10-24 | Discharge: 2013-10-24 | Disposition: A | Discharge status: Home / Self Care | Payer: Managed Care, Other (non HMO) | Attending: Emergency Medicine | Admitting: Emergency Medicine

## 2013-10-24 ENCOUNTER — Emergency Department (HOSPITAL_COMMUNITY): Payer: Managed Care, Other (non HMO)

## 2013-10-24 DIAGNOSIS — Z8619 Personal history of other infectious and parasitic diseases: Secondary | ICD-10-CM | POA: Insufficient documentation

## 2013-10-24 DIAGNOSIS — J3489 Other specified disorders of nose and nasal sinuses: Secondary | ICD-10-CM | POA: Insufficient documentation

## 2013-10-24 DIAGNOSIS — R5381 Other malaise: Secondary | ICD-10-CM | POA: Insufficient documentation

## 2013-10-24 DIAGNOSIS — B349 Viral infection, unspecified: Secondary | ICD-10-CM

## 2013-10-24 DIAGNOSIS — B9789 Other viral agents as the cause of diseases classified elsewhere: Secondary | ICD-10-CM | POA: Insufficient documentation

## 2013-10-24 DIAGNOSIS — R5383 Other fatigue: Secondary | ICD-10-CM

## 2013-10-24 DIAGNOSIS — J45909 Unspecified asthma, uncomplicated: Secondary | ICD-10-CM | POA: Insufficient documentation

## 2013-10-24 NOTE — ED Provider Notes (Signed)
CSN: [id number]     Arrival date & time 10/24/13  1941 History  This chart was scribed for non-physician practitioner Arman FilterGail K Avrum Kimball, NP, working with Doug SouSam Jacubowitz, MD, by Yevette EdwardsAngela Bracken, ED Scribe. This patient was seen in room WTR7/WTR7 and the patient's care was started at 8:18 PM.   First MD Initiated Contact with Patient 10/24/13 1954     Chief Complaint  Patient presents with  . Cough    The history is provided by the patient. No language interpreter was used.   HPI Comments: Nicole Howard is a 23 y.o. female who presents to the Emergency Department complaining of a cough productive of yellow mucus which has persisted for a week. As associated symptoms, she has also experienced fatigue which began yesterday as well as rhinorrhea which recently resolved. The pt has treated her symptoms with Mucinex. She denies a fever, sore throat, and ear pain. The pt has a h/o asthma; she has not used her inhaler in years. She denies a h/o pneumonia. Her last menstrual cycle was last week; she denies possibility of pregnancy.   Past Medical History  Diagnosis Date  . Pregnant state, incidental   . Herpes    Past Surgical History  Procedure Laterality Date  . No past surgeries     Family History  Problem Relation Age of Onset  . Adopted: Yes  . Other Neg Hx    History  Substance Use Topics  . Smoking status: Never Smoker   . Smokeless tobacco: Never Used  . Alcohol Use: No   OB History   Grav Para Term Preterm Abortions TAB SAB Ect Mult Living   2 2 2       2      Review of Systems  Constitutional: Positive for fatigue. Negative for fever.  HENT: Positive for rhinorrhea. Negative for ear pain and sore throat.   Respiratory: Positive for cough.   Gastrointestinal: Negative for nausea and abdominal pain.  Genitourinary: Negative for dysuria.  Neurological: Negative for headaches.  All other systems reviewed and are negative.   Allergies  Review of patient's allergies indicates no  known allergies.  Home Medications   Current Outpatient Rx  Name  Route  Sig  Dispense  Refill  . amoxicillin (AMOXIL) 500 MG capsule   Oral   Take 1 capsule (500 mg total) by mouth 3 (three) times daily. Start on 08/08/13 if no improvement in symptoms   30 capsule   0   . etonogestrel (IMPLANON) 68 MG IMPL implant   Subcutaneous   Inject 1 each into the skin once.         Marland Kitchen. HYDROcodone-acetaminophen (HYCET) 7.5-325 mg/15 ml solution   Oral   Take 15 mLs by mouth 4 (four) times daily as needed for moderate pain.   120 mL   0    Triage Vitals: BP 115/74  Pulse 82  Temp(Src) 98.5 F (36.9 C) (Oral)  Resp 18  Ht 5\' 6"  (1.676 m)  Wt 98 lb (44.453 kg)  BMI 15.83 kg/m2  SpO2 100%  LMP 10/17/2013  Physical Exam  Nursing note and vitals reviewed. Constitutional: She is oriented to person, place, and time. She appears well-developed and well-nourished. No distress.  HENT:  Head: Normocephalic and atraumatic.  Right Ear: External ear normal.  Left Ear: External ear normal.  Mouth/Throat: Oropharynx is clear and moist. No oropharyngeal exudate.  Eyes: EOM are normal.  Neck: Neck supple.  Cardiovascular: Normal rate.   Pulmonary/Chest: Effort normal  and breath sounds normal. No respiratory distress. She has no wheezes. She has no rales. She exhibits no tenderness.  Musculoskeletal: Normal range of motion.  Lymphadenopathy:    She has no cervical adenopathy.  Neurological: She is alert and oriented to person, place, and time.  Skin: Skin is warm and dry.  Psychiatric: She has a normal mood and affect. Her behavior is normal.    ED Course  Procedures (including critical care time)  DIAGNOSTIC STUDIES: Oxygen Saturation is 100% on room air, normal by my interpretation.    COORDINATION OF CARE:  8:22 PM- Discussed treatment plan with patient, which includes a chest x-ray, and the patient agreed to the plan.   Labs Review Labs Reviewed - No data to display Imaging  Review Dg Chest 2 View  10/24/2013   CLINICAL DATA:  Cough for 1 week.  EXAM: CHEST  2 VIEW  COMPARISON:  None.  FINDINGS: The heart size and mediastinal contours are within normal limits. Both lungs are clear. The visualized skeletal structures are unremarkable.  IMPRESSION: No active cardiopulmonary disease.   Electronically Signed   By: Amie Portland M.D.   On: 10/24/2013 21:22     EKG Interpretation None      MDM   Final diagnoses:  None       I personally performed the services described in this documentation, which was scribed in my presence. The recorded information has been reviewed and is accurate.  Arman Filter, NP 10/24/13 2152

## 2013-10-24 NOTE — ED Notes (Signed)
Pt states for the past week she's had a cough, yellow mucus, states yesterday felt like she had no energy and was tired, states has been getting plenty of sleep though.

## 2013-10-24 NOTE — Discharge Instructions (Signed)
Your xray is normal

## 2013-10-24 NOTE — ED Provider Notes (Signed)
Medical screening examination/treatment/procedure(s) were performed by non-physician practitioner and as supervising physician I was immediately available for consultation/collaboration.   EKG Interpretation None       Dasani Thurlow, MD 10/24/13 2308 

## 2013-12-01 ENCOUNTER — Encounter (HOSPITAL_COMMUNITY): Payer: Self-pay | Admitting: Emergency Medicine

## 2013-12-01 ENCOUNTER — Emergency Department (HOSPITAL_COMMUNITY)
Admission: EM | Admit: 2013-12-01 | Discharge: 2013-12-01 | Disposition: A | Discharge status: Home / Self Care | Payer: Managed Care, Other (non HMO) | Attending: Emergency Medicine | Admitting: Emergency Medicine

## 2013-12-01 ENCOUNTER — Emergency Department (HOSPITAL_COMMUNITY): Payer: Managed Care, Other (non HMO)

## 2013-12-01 DIAGNOSIS — R6889 Other general symptoms and signs: Secondary | ICD-10-CM

## 2013-12-01 DIAGNOSIS — R52 Pain, unspecified: Secondary | ICD-10-CM | POA: Insufficient documentation

## 2013-12-01 DIAGNOSIS — Z8619 Personal history of other infectious and parasitic diseases: Secondary | ICD-10-CM | POA: Insufficient documentation

## 2013-12-01 DIAGNOSIS — J029 Acute pharyngitis, unspecified: Secondary | ICD-10-CM | POA: Insufficient documentation

## 2013-12-01 LAB — RAPID STREP SCREEN (MED CTR MEBANE ONLY): Streptococcus, Group A Screen (Direct): NEGATIVE

## 2013-12-01 NOTE — Discharge Instructions (Signed)
You may alternate between 1000 mg of Tylenol every 6 hours and 600 mg of ibuprofen every 6 hours as needed for fever and pain. Please continue to drink plenty of fluids.   Possible Influenza, Adult Influenza ("the flu") is a viral infection of the respiratory tract. It occurs more often in winter months because people spend more time in close contact with one another. Influenza can make you feel very sick. Influenza easily spreads from person to person (contagious). CAUSES  Influenza is caused by a virus that infects the respiratory tract. You can catch the virus by breathing in droplets from an infected person's cough or sneeze. You can also catch the virus by touching something that was recently contaminated with the virus and then touching your mouth, nose, or eyes. SYMPTOMS  Symptoms typically last 4 to 10 days and may include:  Fever.  Chills.  Headache, body aches, and muscle aches.  Sore throat.  Chest discomfort and cough.  Poor appetite.  Weakness or feeling tired.  Dizziness.  Nausea or vomiting. DIAGNOSIS  Diagnosis of influenza is often made based on your history and a physical exam. A nose or throat swab test can be done to confirm the diagnosis. RISKS AND COMPLICATIONS You may be at risk for a more severe case of influenza if you smoke cigarettes, have diabetes, have chronic heart disease (such as heart failure) or lung disease (such as asthma), or if you have a weakened immune system. Elderly people and pregnant women are also at risk for more serious infections. The most common complication of influenza is a lung infection (pneumonia). Sometimes, this complication can require emergency medical care and may be life-threatening. PREVENTION  An annual influenza vaccination (flu shot) is the best way to avoid getting influenza. An annual flu shot is now routinely recommended for all adults in the [place] TREATMENT  In mild cases, influenza goes away on its own. Treatment is  directed at relieving symptoms. For more severe cases, your caregiver may prescribe antiviral medicines to shorten the sickness. Antibiotic medicines are not effective, because the infection is caused by a virus, not by bacteria. HOME CARE INSTRUCTIONS  Only take over-the-counter or prescription medicines for pain, discomfort, or fever as directed by your caregiver.  Use a cool mist humidifier to make breathing easier.  Get plenty of rest until your temperature returns to normal. This usually takes 3 to 4 days.  Drink enough fluids to keep your urine clear or pale yellow.  Cover your mouth and nose when coughing or sneezing, and wash your hands well to avoid spreading the virus.  Stay home from work or school until your fever has been gone for at least 1 full day. SEEK MEDICAL CARE IF:   You have chest pain or a deep cough that worsens or produces more mucus.  You have nausea, vomiting, or diarrhea. SEEK IMMEDIATE MEDICAL CARE IF:   You have difficulty breathing, shortness of breath, or your skin or nails turn bluish.  You have severe neck pain or stiffness.  You have a severe headache, facial pain, or earache.  You have a worsening or recurring fever.  You have nausea or vomiting that cannot be controlled. MAKE SURE YOU:  Understand these instructions.  Will watch your condition.  Will get help right away if you are not doing well or get worse. Document Released: 08/02/2000 Document Revised: 02/04/2012 Document Reviewed: 11/04/2011 [facility] Patient Information 2014 [place], [place].   Viral Infections A viral infection can be caused  by different types of viruses.Most viral infections are not serious and resolve on their own. However, some infections may cause severe symptoms and may lead to further complications. SYMPTOMS Viruses can frequently cause:  Minor sore throat.  Aches and pains.  Headaches.  Runny nose.  Different types of rashes.  Watery  eyes.  Tiredness.  Cough.  Loss of appetite.  Gastrointestinal infections, resulting in nausea, vomiting, and diarrhea. These symptoms do not respond to antibiotics because the infection is not caused by bacteria. However, you might catch a bacterial infection following the viral infection. This is sometimes called a "superinfection." Symptoms of such a bacterial infection may include:  Worsening sore throat with pus and difficulty swallowing.  Swollen neck glands.  Chills and a high or persistent fever.  Severe headache.  Tenderness over the sinuses.  Persistent overall ill feeling (malaise), muscle aches, and tiredness (fatigue).  Persistent cough.  Yellow, green, or brown mucus production with coughing. HOME CARE INSTRUCTIONS   Only take over-the-counter or prescription medicines for pain, discomfort, diarrhea, or fever as directed by your caregiver.  Drink enough water and fluids to keep your urine clear or pale yellow. Sports drinks can provide valuable electrolytes, sugars, and hydration.  Get plenty of rest and maintain proper nutrition. Soups and broths with crackers or rice are fine. SEEK IMMEDIATE MEDICAL CARE IF:   You have severe headaches, shortness of breath, chest pain, neck pain, or an unusual rash.  You have uncontrolled vomiting, diarrhea, or you are unable to keep down fluids.  You or your child has an oral temperature above 102 F (38.9 C), not controlled by medicine.  Your baby is older than 3 months with a rectal temperature of 102 F (38.9 C) or higher.  Your baby is 393 months old or younger with a rectal temperature of 100.4 F (38 C) or higher. MAKE SURE YOU:   Understand these instructions.  Will watch your condition.  Will get help right away if you are not doing well or get worse. Document Released: 05/15/2005 Document Revised: 10/28/2011 Document Reviewed: 12/10/2010 [facility] Patient Information 2014 [place], [place].

## 2013-12-01 NOTE — ED Notes (Signed)
Pt states she has had a productive cough with clear sputum x 2 days and generalized body aches. Alert and oriented.

## 2013-12-01 NOTE — ED Provider Notes (Signed)
TIME SEEN: 3:00 AM  CHIEF COMPLAINT: Cough, congestion, sore throat, body aches  HPI: Patient is a 23 y.o. female with no significant past medical history presents emergency department with 2 days of cough, congestion, sore throat and body aches. 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 No recent travel. No fever, headache or neck pain, vomiting or diarrhea, chest pain or abdominal pain, shortness of breath. No rash.  ROS: See HPI Constitutional: no fever  Eyes: no drainage  ENT: runny nose   Cardiovascular:  no chest pain  Resp: no SOB  GI: no vomiting GU: no dysuria Integumentary: no rash  Allergy: no hives  Musculoskeletal: no leg swelling  Neurological: no slurred speech ROS otherwise negative  PAST MEDICAL HISTORY/PAST SURGICAL HISTORY:  Past Medical History  Diagnosis Date  . Pregnant state, incidental   . Herpes     MEDICATIONS:  Prior to Admission medications   Medication Sig Start Date End Date Taking? Authorizing Provider  etonogestrel (IMPLANON) 68 MG IMPL implant Inject 1 each into the skin once. December 2014    Historical Provider, MD    ALLERGIES:  No Known Allergies  SOCIAL HISTORY:  History  Substance Use Topics  . Smoking status: Never Smoker   . Smokeless tobacco: Never Used  . Alcohol Use: No    FAMILY HISTORY: Family History  Problem Relation Age of Onset  . Adopted: Yes  . Other Neg Hx     EXAM: BP 124/70  Pulse 87  Temp(Src) 98.1 F (36.7 C) (Oral)  SpO2 100%  LMP 10/24/2013 CONSTITUTIONAL: Alert and oriented and responds appropriately to questions. Well-appearing; well-nourished HEAD: Normocephalic EYES: Conjunctivae clear, PERRL ENT: normal nose; no rhinorrhea; moist mucous membranes; oropharynx is slightly erythematous with mild bilateral tonsillar hypertrophy with exudate, no uvular deviation or trismus or drooling, no muffled voice, patient does have a hoarse voice NECK: Supple, no meningismus,  bilateral anterior cervical lymphadenopathy CARD: RRR; S1 and S2 appreciated; no murmurs, no clicks, no rubs, no gallops RESP: Normal chest excursion without splinting or tachypnea; breath sounds clear and equal bilaterally; no wheezes, no rhonchi, no rales,  ABD/GI: Normal bowel sounds; non-distended; soft, non-tender, no rebound, no guarding BACK:  The back appears normal and is non-tender to palpation, there is no CVA tenderness EXT: Normal ROM in all joints; non-tender to palpation; no edema; normal capillary refill; no cyanosis    SKIN: Normal color for age and race; warm NEURO: Moves all extremities equally PSYCH: The patient's mood and manner are appropriate. Grooming and personal hygiene are appropriate.  MEDICAL DECISION MAKING: Patient here with flulike symptoms. Will obtain strep swab, chest x-ray. Patient denies wanting a Tylenol or Motrin at this time. She is otherwise well-appearing, well-hydrated, nontoxic and hemodynamically stable. No meningismus on exam. Anticipate discharge home.  ED PROGRESS: Patient's strep swab and chest x-ray negative. More likely viral illness. Have discussed supportive care instructions with patient. Given return precautions. She verbalizes understanding is comfortable plan.     Layla MawKristen N Lakely Elmendorf, DO 12/01/13 [phone]

## 2013-12-03 LAB — CULTURE, GROUP A STREP

## 2013-12-19 ENCOUNTER — Encounter (HOSPITAL_COMMUNITY): Payer: Self-pay | Admitting: Emergency Medicine

## 2013-12-19 ENCOUNTER — Emergency Department (HOSPITAL_COMMUNITY)
Admission: EM | Admit: 2013-12-19 | Discharge: 2013-12-19 | Discharge status: Against Medical Advice | Payer: Managed Care, Other (non HMO) | Attending: Emergency Medicine | Admitting: Emergency Medicine

## 2013-12-19 DIAGNOSIS — M542 Cervicalgia: Secondary | ICD-10-CM | POA: Insufficient documentation

## 2013-12-19 DIAGNOSIS — R51 Headache: Secondary | ICD-10-CM | POA: Insufficient documentation

## 2013-12-19 NOTE — ED Notes (Signed)
Pt states that she has had a headache for 3 days, radiating into her neck. Pt able to touch her chin to her chest.

## 2013-12-19 NOTE — ED Notes (Signed)
Pt still has not returned to be seen.

## 2013-12-19 NOTE — ED Notes (Signed)
Pt called to room with no response x 2

## 2014-01-16 ENCOUNTER — Encounter (HOSPITAL_COMMUNITY): Payer: Self-pay | Admitting: Emergency Medicine

## 2014-01-16 ENCOUNTER — Emergency Department (HOSPITAL_COMMUNITY)
Admission: EM | Admit: 2014-01-16 | Discharge: 2014-01-16 | Disposition: A | Discharge status: Home / Self Care | Payer: Managed Care, Other (non HMO) | Attending: Emergency Medicine | Admitting: Emergency Medicine

## 2014-01-16 DIAGNOSIS — Z8619 Personal history of other infectious and parasitic diseases: Secondary | ICD-10-CM | POA: Insufficient documentation

## 2014-01-16 DIAGNOSIS — M79609 Pain in unspecified limb: Secondary | ICD-10-CM | POA: Insufficient documentation

## 2014-01-16 DIAGNOSIS — R509 Fever, unspecified: Secondary | ICD-10-CM

## 2014-01-16 DIAGNOSIS — R52 Pain, unspecified: Secondary | ICD-10-CM

## 2014-01-16 DIAGNOSIS — J039 Acute tonsillitis, unspecified: Secondary | ICD-10-CM | POA: Insufficient documentation

## 2014-01-16 LAB — RAPID STREP SCREEN (MED CTR MEBANE ONLY): Streptococcus, Group A Screen (Direct): NEGATIVE

## 2014-01-16 MED ORDER — PENICILLIN V POTASSIUM 500 MG PO TABS: 500.0000 mg | ORAL_TABLET | Freq: Four times a day (QID) | ORAL | Status: DC

## 2014-01-16 MED ORDER — ACETAMINOPHEN 500 MG PO TABS
1000.0000 mg | ORAL_TABLET | Freq: Once | ORAL | Status: AC
  Administered 2014-01-16: 1000 mg via ORAL
  Filled 2014-01-16: qty 2

## 2014-01-16 NOTE — ED Notes (Signed)
Pt c/o sore throat pain with painful swallowing since yesterday.  Pt states she also has bilateral leg pain which started today. Pt alert, no acute distress. Skin warm and dry.

## 2014-01-16 NOTE — ED Provider Notes (Signed)
Medical screening examination/treatment/procedure(s) were performed by non-physician practitioner and as supervising physician I was immediately available for consultation/collaboration.     Aleksander Edmiston E Khristi Schiller, MD 01/16/14 2350 

## 2014-01-16 NOTE — ED Provider Notes (Signed)
CSN: [id number]     Arrival date & time 01/16/14  1745 History  This chart was scribed for Junius Finner, PA-C, non-physician practitioner working with Suzi Roots, MD by Nicholos Johns, ED scribe. This patient was seen in room WTR8/WTR8 and the patient's care was started at 6:49 PM.   Chief Complaint  Patient presents with  . Sore Throat  . Leg Pain   The history is provided by the patient. No language interpreter was used.  HPI Comments: Nicole Howard is a 23 y.o. female w/ hx of strep throat presents to the Emergency Department complaining of sore throat, onset yesterday. Able to swallow but with pain. States temperature before arriving at the ED was 102. Also presents with HA and bilateral leg pain. Has taken Advil at home for pain and fever with minimal relief. Last positive strep throat was a couple of months ago. No allergies to report. No hx of tonsillar abscess. Denies any sick contacts. Denies nausea, vomiting, and abdominal pain.  Past Medical History  Diagnosis Date  . Pregnant state, incidental   . Herpes    Past Surgical History  Procedure Laterality Date  . No past surgeries     Family History  Problem Relation Age of Onset  . Adopted: Yes  . Other Neg Hx    History  Substance Use Topics  . Smoking status: Never Smoker   . Smokeless tobacco: Never Used  . Alcohol Use: No   OB History   Grav Para Term Preterm Abortions TAB SAB Ect Mult Living   2 2 2       2      Review of Systems  Constitutional: Positive for fever.  HENT: Positive for sore throat. Negative for voice change.   Gastrointestinal: Negative for nausea, vomiting and abdominal pain.  Musculoskeletal: Positive for myalgias.  Neurological: Positive for headaches.  All other systems reviewed and are negative.  Allergies  Review of patient's allergies indicates no known allergies.  Home Medications   Prior to Admission medications   Medication Sig Start Date End Date Taking? Authorizing  Provider  etonogestrel (IMPLANON) 68 MG IMPL implant Inject 1 each into the skin once. December 2014   Yes Historical Provider, MD  ibuprofen (ADVIL,MOTRIN) 200 MG tablet Take 400 mg by mouth every 6 (six) hours as needed for moderate pain.   Yes Historical Provider, MD  penicillin v potassium (VEETID) 500 MG tablet Take 1 tablet (500 mg total) by mouth 4 (four) times daily. 01/16/14   Junius Finner, PA-C   Triage Vitals: BP 122/72  Pulse 113  Temp(Src) 102 F (38.9 C) (Oral)  Resp 20  SpO2 100%  Physical Exam  Nursing note and vitals reviewed. Constitutional: She is oriented to person, place, and time. She appears well-developed and well-nourished.  HENT:  Head: Normocephalic and atraumatic.  Mouth/Throat: Uvula is midline and mucous membranes are normal. No trismus in the jaw. No uvula swelling. Oropharyngeal exudate, posterior oropharyngeal edema and posterior oropharyngeal erythema present.  Eyes: EOM are normal.  Neck: Normal range of motion. Neck supple. No JVD present. No tracheal deviation present. No thyromegaly present.  Cardiovascular: Normal rate, regular rhythm and normal heart sounds.   No murmur heard. Pulmonary/Chest: Effort normal and breath sounds normal. No stridor. No respiratory distress. She has no wheezes. She has no rales. She exhibits no tenderness.  Abdominal: Soft. Bowel sounds are normal. She exhibits no distension and no mass. There is no tenderness. There is no rebound and no  guarding.  Musculoskeletal: Normal range of motion.  Lymphadenopathy:    She has cervical adenopathy.  Neurological: She is alert and oriented to person, place, and time.  Skin: Skin is warm and dry.  Psychiatric: She has a normal mood and affect. Her behavior is normal.   ED Course  Procedures (including critical care time) DIAGNOSTIC STUDIES: Oxygen Saturation is 100% on room air, normal by my interpretation.    COORDINATION OF CARE: At 6:53 PM: Discussed treatment plan with  patient which includes antibiotic and prednisone. Patient agrees.   Labs Review Results for orders placed during the hospital encounter of 01/16/14  RAPID STREP SCREEN      Result Value Ref Range   Streptococcus, Group A Screen (Direct) NEGATIVE  NEGATIVE   Imaging Review No results found.   EKG Interpretation None      MDM   Final diagnoses:  Tonsillitis with exudate  Body aches  Fever   Pt with hx of recurrent strep throat c/o sore throat, HA and body aches. Pt has temp of 102 in triage. On exam-bilateral tonsillar erythema, edema, and exudate.  Rapid strep negative, however pt was seen just last month for same.  Will tx with PCN.  Strep culture pending.  Advised pt to use acetaminophen and ibuprofen as needed for fever and pain. Encouraged rest and fluids. Return precautions provided. Pt verbalized understanding and agreement with tx plan.  I personally performed the services described in this documentation, which was scribed in my presence. The recorded information has been reviewed and is accurate.     Junius Finnerrin O'Malley, PA-C 01/16/14 1909  Junius FinnerErin O'Malley, PA-C 01/16/14 2000

## 2014-01-16 NOTE — Discharge Instructions (Signed)
Take Ibuprofen (Motrin) every 6-8 hours for fever and pain  °Alternate with Tylenol  °Take Tylenol every 4-6 hours as needed for fever and pain  °Follow-up with your primary care provider next week for recheck of symptoms if not improving.  °Be sure to drink plenty of fluids and rest, at least 8hrs of sleep a night, preferably more while you are sick. °Return to the ED if you cannot keep down fluids/signs of dehydration, fever not reducing with Tylenol, difficulty breathing/wheezing, stiff neck, worsening condition, or other concerns (see below)  ° ° ° °

## 2014-01-18 LAB — CULTURE, GROUP A STREP

## 2014-06-20 ENCOUNTER — Encounter (HOSPITAL_COMMUNITY): Payer: Self-pay | Admitting: Emergency Medicine

## 2015-02-17 ENCOUNTER — Emergency Department (HOSPITAL_COMMUNITY): Payer: Managed Care, Other (non HMO)

## 2015-02-17 ENCOUNTER — Emergency Department (HOSPITAL_COMMUNITY)
Admission: EM | Admit: 2015-02-17 | Discharge: 2015-02-17 | Disposition: A | Discharge status: Home / Self Care | Payer: Managed Care, Other (non HMO) | Attending: Emergency Medicine | Admitting: Emergency Medicine

## 2015-02-17 ENCOUNTER — Encounter (HOSPITAL_COMMUNITY): Payer: Self-pay

## 2015-02-17 DIAGNOSIS — Z3202 Encounter for pregnancy test, result negative: Secondary | ICD-10-CM | POA: Insufficient documentation

## 2015-02-17 DIAGNOSIS — R102 Pelvic and perineal pain: Secondary | ICD-10-CM

## 2015-02-17 DIAGNOSIS — N898 Other specified noninflammatory disorders of vagina: Secondary | ICD-10-CM | POA: Insufficient documentation

## 2015-02-17 DIAGNOSIS — Z8619 Personal history of other infectious and parasitic diseases: Secondary | ICD-10-CM | POA: Insufficient documentation

## 2015-02-17 DIAGNOSIS — Z792 Long term (current) use of antibiotics: Secondary | ICD-10-CM | POA: Insufficient documentation

## 2015-02-17 LAB — COMPREHENSIVE METABOLIC PANEL
ALT: 12 U/L — ABNORMAL LOW (ref 14–54)
AST: 17 U/L (ref 15–41)
Albumin: 4.3 g/dL (ref 3.5–5.0)
Alkaline Phosphatase: 50 U/L (ref 38–126)
Anion gap: 8 (ref 5–15)
BUN: 13 mg/dL (ref 6–20)
CO2: 26 mmol/L (ref 22–32)
Calcium: 9 mg/dL (ref 8.9–10.3)
Chloride: 106 mmol/L (ref 101–111)
Creatinine, Ser: 0.73 mg/dL (ref 0.44–1.00)
GFR calc Af Amer: >60 mL/min (ref >60)
GFR calc non Af Amer: >60 mL/min (ref >60)
Glucose, Bld: 96 mg/dL (ref 65–99)
Potassium: 3.5 mmol/L (ref 3.5–5.1)
Sodium: 140 mmol/L (ref 135–145)
Total Bilirubin: 0.8 mg/dL (ref 0.3–1.2)
Total Protein: 7 g/dL (ref 6.5–8.1)

## 2015-02-17 LAB — CBC WITH DIFFERENTIAL/PLATELET
Basophils Absolute: 0 10*3/uL (ref 0.0–0.1)
Basophils Relative: 1 % (ref 0–1)
Eosinophils Absolute: 0.1 10*3/uL (ref 0.0–0.7)
Eosinophils Relative: 2 % (ref 0–5)
HCT: 34.5 % — ABNORMAL LOW (ref 36.0–46.0)
Hemoglobin: 11.1 g/dL — ABNORMAL LOW (ref 12.0–15.0)
Lymphocytes Relative: 38 % (ref 12–46)
Lymphs Abs: 2.6 10*3/uL (ref 0.7–4.0)
MCH: 28.5 pg (ref 26.0–34.0)
MCHC: 32.2 g/dL (ref 30.0–36.0)
MCV: 88.7 fL (ref 78.0–100.0)
Monocytes Absolute: 0.8 10*3/uL (ref 0.1–1.0)
Monocytes Relative: 12 % (ref 3–12)
Neutro Abs: 3.4 10*3/uL (ref 1.7–7.7)
Neutrophils Relative %: 49 % (ref 43–77)
Platelets: 214 10*3/uL (ref 150–400)
RBC: 3.89 MIL/uL (ref 3.87–5.11)
RDW: 12.8 % (ref 11.5–15.5)
WBC: 6.9 10*3/uL (ref 4.0–10.5)

## 2015-02-17 LAB — POC URINE PREG, ED: Preg Test, Ur: NEGATIVE

## 2015-02-17 LAB — URINALYSIS, ROUTINE W REFLEX MICROSCOPIC
Bilirubin Urine: NEGATIVE
Glucose, UA: NEGATIVE mg/dL
Hgb urine dipstick: NEGATIVE
Ketones, ur: NEGATIVE mg/dL
Leukocytes, UA: NEGATIVE
Nitrite: NEGATIVE
Protein, ur: NEGATIVE mg/dL
Specific Gravity, Urine: 1.034 — ABNORMAL HIGH (ref 1.005–1.030)
Urobilinogen, UA: 1 mg/dL (ref 0.0–1.0)
pH: 6 (ref 5.0–8.0)

## 2015-02-17 LAB — WET PREP, GENITAL
Trich, Wet Prep: NONE SEEN
Yeast Wet Prep HPF POC: NONE SEEN

## 2015-02-17 LAB — HIV ANTIBODY (ROUTINE TESTING W REFLEX): HIV Screen 4th Generation wRfx: NONREACTIVE

## 2015-02-17 LAB — GC/CHLAMYDIA PROBE AMP (~~LOC~~) NOT AT ARMC
Chlamydia: NEGATIVE
Neisseria Gonorrhea: NEGATIVE

## 2015-02-17 MED ORDER — IBUPROFEN 800 MG PO TABS: 800.0000 mg | ORAL_TABLET | Freq: Three times a day (TID) | ORAL | Status: DC

## 2015-02-17 NOTE — Discharge Instructions (Signed)
Pelvic Pain Female pelvic pain can be caused by many different things and start from a variety of places. Pelvic pain refers to pain that is located in the lower half of the abdomen and between your hips. The pain may occur over a short period of time (acute) or may be reoccurring (chronic). The cause of pelvic pain may be related to disorders affecting the female reproductive organs (gynecologic), but it may also be related to the bladder, kidney stones, an intestinal complication, or muscle or skeletal problems. Getting help right away for pelvic pain is important, especially if there has been severe, sharp, or a sudden onset of unusual pain. It is also important to get help right away because some types of pelvic pain can be life threatening.  CAUSES  Below are only some of the causes of pelvic pain. The causes of pelvic pain can be in one of several categories.   Gynecologic.  Pelvic inflammatory disease.  Sexually transmitted infection.  Ovarian cyst or a twisted ovarian ligament (ovarian torsion).  Uterine lining that grows outside the uterus (endometriosis).  Fibroids, cysts, or tumors.  Ovulation.  Pregnancy.  Pregnancy that occurs outside the uterus (ectopic pregnancy).  Miscarriage.  Labor.  Abruption of the placenta or ruptured uterus.  Infection.  Uterine infection (endometritis).  Bladder infection.  Diverticulitis.  Miscarriage related to a uterine infection (septic abortion).  Bladder.  Inflammation of the bladder (cystitis).  Kidney stone(s).  Gastrointestinal.  Constipation.  Diverticulitis.  Neurologic.  Trauma.  Feeling pelvic pain because of mental or emotional causes (psychosomatic).  Cancers of the bowel or pelvis. EVALUATION  Your caregiver will want to take a careful history of your concerns. This includes recent changes in your health, a careful gynecologic history of your periods (menses), and a sexual history. Obtaining your family  history and medical history is also important. Your caregiver may suggest a pelvic exam. A pelvic exam will help identify the location and severity of the pain. It also helps in the evaluation of which organ system may be involved. In order to identify the cause of the pelvic pain and be properly treated, your caregiver may order tests. These tests may include:   A pregnancy test.  Pelvic ultrasonography.  An X-ray exam of the abdomen.  A urinalysis or evaluation of vaginal discharge.  Blood tests. HOME CARE INSTRUCTIONS   Only take over-the-counter or prescription medicines for pain, discomfort, or fever as directed by your caregiver.   Rest as directed by your caregiver.   Eat a balanced diet.   Drink enough fluids to make your urine clear or pale yellow, or as directed.   Avoid sexual intercourse if it causes pain.   Apply warm or cold compresses to the lower abdomen depending on which one helps the pain.   Avoid stressful situations.   Keep a journal of your pelvic pain. Write down when it started, where the pain is located, and if there are things that seem to be associated with the pain, such as food or your menstrual cycle.  Follow up with your caregiver as directed.  SEEK MEDICAL CARE IF:  Your medicine does not help your pain.  You have abnormal vaginal discharge. SEEK IMMEDIATE MEDICAL CARE IF:   You have heavy bleeding from the vagina.   Your pelvic pain increases.   You feel light-headed or faint.   You have chills.   You have pain with urination or blood in your urine.   You have uncontrolled diarrhea   or vomiting.   You have a fever or persistent symptoms for more than 3 days.  You have a fever and your symptoms suddenly get worse.   You are being physically or sexually abused.  MAKE SURE YOU:  Understand these instructions.  Will watch your condition.  Will get help if you are not doing well or get worse. Document Released:  07/02/2004 Document Revised: 12/20/2013 Document Reviewed: 11/25/2011 Exit[employer] Patient Information 2015 [employer]. This information is not intended to replace advice given to you by your health care provider. Make sure you discuss any questions you have with your health care provider.  

## 2015-02-17 NOTE — ED Provider Notes (Signed)
Medical screening examination/treatment/procedure(s) were conducted as a shared visit with non-physician practitioner(s) and myself.  I personally evaluated the patient during the encounter.   EKG Interpretation None       Pt is a 24 y.o. F with this scenario past history who presents the emergency department with a week of right lower abdominal pain. No other associated symptoms. When distracted abdominal exam is benign. Workup in the ED has been unremarkable. No leukocytosis. No sign of urinary tract infection. Transvaginal ultrasound is normal with normal right ovary with normal flow. I do not think that she has appendicitis. I feel she is safe to be discharged home.  Layla MawKristen N Ward, DO 02/17/15 [phone]

## 2015-02-17 NOTE — ED Provider Notes (Signed)
CSN: [id number]     Arrival date & time 02/17/15  0207 History   First MD Initiated Contact with Patient 02/17/15 [phone]     Chief Complaint  Patient presents with  . Abdominal Pain     (Consider location/radiation/quality/duration/timing/severity/associated sxs/prior Treatment) Patient is a 24 y.o. female presenting with abdominal pain. The history is provided by the patient. No language interpreter was used.  Abdominal Pain Associated symptoms: vaginal discharge   Associated symptoms: no chills, no constipation, no diarrhea, no dysuria, no fever, no nausea, no vaginal bleeding and no vomiting   Associated symptoms comment:  Presents with complaint of right lower abdominal/pelvic and suprapubic pain for the past one week, worse tonight. She has a vaginal discharge but denies dysuria, irregular vaginal bleeding, change in bowel movements. She admits to unprotected intercourse with one partner. No fever, nausea or vomiting.    Past Medical History  Diagnosis Date  . Pregnant state, incidental   . Herpes    Past Surgical History  Procedure Laterality Date  . No past surgeries     Family History  Problem Relation Age of Onset  . Adopted: Yes  . Other Neg Hx    History  Substance Use Topics  . Smoking status: Never Smoker   . Smokeless tobacco: Never Used  . Alcohol Use: No   OB History    Gravida Para Term Preterm AB TAB SAB Ectopic Multiple Living   2 2 2       2      Review of Systems  Constitutional: Negative for fever and chills.  HENT: Negative.   Gastrointestinal: Positive for abdominal pain. Negative for nausea, vomiting, diarrhea and constipation.  Genitourinary: Positive for vaginal discharge and pelvic pain. Negative for dysuria and vaginal bleeding.  Musculoskeletal: Negative.  Negative for myalgias.  Skin: Negative.   Neurological: Negative.       Allergies  Review of patient's allergies indicates no known allergies.  Home Medications   Prior to  Admission medications   Medication Sig Start Date End Date Taking? Authorizing Provider  etonogestrel (IMPLANON) 68 MG IMPL implant Inject 1 each into the skin once. December 2014    Historical Provider, MD  ibuprofen (ADVIL,MOTRIN) 200 MG tablet Take 400 mg by mouth every 6 (six) hours as needed for moderate pain.    Historical Provider, MD  penicillin v potassium (VEETID) 500 MG tablet Take 1 tablet (500 mg total) by mouth 4 (four) times daily. 01/16/14   Junius FinnerErin O'Malley, PA-C   BP 120/71 mmHg  Pulse 80  Temp(Src) 98.5 F (36.9 C) (Oral)  Resp 17  SpO2 100% Physical Exam  Constitutional: She is oriented to person, place, and time. She appears well-developed and well-nourished. No distress.  Neck: Normal range of motion.  Pulmonary/Chest: Effort normal.  Abdominal: There is tenderness.  Lower abdominal tenderness. No guarding, or peritoneal signs.   Genitourinary:  No CMT or left adnexal tenderness or mass. There is tenderness localized to right adnexal without palpable mass or fullness. No cervical discharge, redness or friability.  Neurological: She is alert and oriented to person, place, and time.  Skin: Skin is warm and dry.  Psychiatric: She has a normal mood and affect.    ED Course  Procedures (including critical care time) Labs Review Labs Reviewed  WET PREP, GENITAL  CBC WITH DIFFERENTIAL/PLATELET  COMPREHENSIVE METABOLIC PANEL  URINALYSIS, ROUTINE W REFLEX MICROSCOPIC (NOT AT [facility])  HIV ANTIBODY (ROUTINE TESTING)  POC URINE PREG, ED  GC/CHLAMYDIA PROBE AMP (  [place]) NOT AT [facility]   Results for orders placed or performed during the hospital encounter of 02/17/15  Wet prep, genital  Result Value Ref Range   Yeast Wet Prep HPF POC NONE SEEN NONE SEEN   Trich, Wet Prep NONE SEEN NONE SEEN   Clue Cells Wet Prep HPF POC MODERATE (A) NONE SEEN   WBC, Wet Prep HPF POC FEW (A) NONE SEEN  CBC with Differential  Result Value Ref Range   WBC 6.9 4.0 - 10.5 K/uL   RBC 3.89  3.87 - 5.11 MIL/uL   Hemoglobin 11.1 (L) 12.0 - 15.0 g/dL   HCT 16.1 (L) 09.6 - 04.5 %   MCV 88.7 78.0 - 100.0 fL   MCH 28.5 26.0 - 34.0 pg   MCHC 32.2 30.0 - 36.0 g/dL   RDW 40.9 81.1 - 91.4 %   Platelets 214 150 - 400 K/uL   Neutrophils Relative % 49 43 - 77 %   Neutro Abs 3.4 1.7 - 7.7 K/uL   Lymphocytes Relative 38 12 - 46 %   Lymphs Abs 2.6 0.7 - 4.0 K/uL   Monocytes Relative 12 3 - 12 %   Monocytes Absolute 0.8 0.1 - 1.0 K/uL   Eosinophils Relative 2 0 - 5 %   Eosinophils Absolute 0.1 0.0 - 0.7 K/uL   Basophils Relative 1 0 - 1 %   Basophils Absolute 0.0 0.0 - 0.1 K/uL  Comprehensive metabolic panel  Result Value Ref Range   Sodium 140 135 - 145 mmol/L   Potassium 3.5 3.5 - 5.1 mmol/L   Chloride 106 101 - 111 mmol/L   CO2 26 22 - 32 mmol/L   Glucose, Bld 96 65 - 99 mg/dL   BUN 13 6 - 20 mg/dL   Creatinine, Ser 7.82 0.44 - 1.00 mg/dL   Calcium 9.0 8.9 - 95.6 mg/dL   Total Protein 7.0 6.5 - 8.1 g/dL   Albumin 4.3 3.5 - 5.0 g/dL   AST 17 15 - 41 U/L   ALT 12 (L) 14 - 54 U/L   Alkaline Phosphatase 50 38 - 126 U/L   Total Bilirubin 0.8 0.3 - 1.2 mg/dL   GFR calc non Af Amer >60 >60 mL/min   GFR calc Af Amer >60 >60 mL/min   Anion gap 8 5 - 15  Urinalysis, Routine w reflex microscopic (not at [facility])  Result Value Ref Range   Color, Urine YELLOW YELLOW   APPearance CLOUDY (A) CLEAR   Specific Gravity, Urine 1.034 (H) 1.005 - 1.030   pH 6.0 5.0 - 8.0   Glucose, UA NEGATIVE NEGATIVE mg/dL   Hgb urine dipstick NEGATIVE NEGATIVE   Bilirubin Urine NEGATIVE NEGATIVE   Ketones, ur NEGATIVE NEGATIVE mg/dL   Protein, ur NEGATIVE NEGATIVE mg/dL   Urobilinogen, UA 1.0 0.0 - 1.0 mg/dL   Nitrite NEGATIVE NEGATIVE   Leukocytes, UA NEGATIVE NEGATIVE  HIV antibody  Result Value Ref Range   HIV Screen 4th Generation wRfx Non Reactive Non Reactive  POC Urine Pregnancy, ED  (If Pre-menopausal female)  not at [facility]  Result Value Ref Range   Preg Test, Ur NEGATIVE NEGATIVE   GC/Chlamydia probe amp ([place])not at [facility]  Result Value Ref Range   Chlamydia Negative    Neisseria gonorrhea Negative    US Transvaginal Non-ob  02/17/2015   CLINICAL DATA:  Right lower quadrant pain for 3 days.  EXAM: TRANSABDOMINAL AND TRANSVAGINAL ULTRASOUND OF PELVIS  DOPPLER ULTRASOUND OF OVARIES  TECHNIQUE:  Both transabdominal and transvaginal ultrasound examinations of the pelvis were performed. Transabdominal technique was performed for global imaging of the pelvis including uterus, ovaries, adnexal regions, and pelvic cul-de-sac.  It was necessary to proceed with endovaginal exam following the transabdominal exam to visualize the right over. Color and duplex Doppler ultrasound was utilized to evaluate blood flow to the ovaries.  COMPARISON:  None.  FINDINGS: Uterus  Measurements: 9.8 x 4.2 x 5.2 cm. No fibroids or other mass visualized.  Endometrium  Thickness: 3 mm.  No focal abnormality visualized.  Right ovary  Measurements: 3.2 x 1.8 x 2.1 cm. Normal appearance/no adnexal mass.  Left ovary  Measurements: 2.4 x 1.6 x 2.4 cm. There is a 1.9 cm simple unilocular left ovarian cyst. Otherwise normal left ovary.  Pulsed Doppler evaluation of both ovaries demonstrates normal low-resistance arterial and venous waveforms.  Other findings  No free fluid.  IMPRESSION: Normal   Electronically Signed   By: Ellery Plunk M.D.   On: 02/17/2015 04:36   US Pelvis Complete  02/17/2015   CLINICAL DATA:  Right lower quadrant pain for 3 days.  EXAM: TRANSABDOMINAL AND TRANSVAGINAL ULTRASOUND OF PELVIS  DOPPLER ULTRASOUND OF OVARIES  TECHNIQUE: Both transabdominal and transvaginal ultrasound examinations of the pelvis were performed. Transabdominal technique was performed for global imaging of the pelvis including uterus, ovaries, adnexal regions, and pelvic cul-de-sac.  It was necessary to proceed with endovaginal exam following the transabdominal exam to visualize the right over. Color and duplex  Doppler ultrasound was utilized to evaluate blood flow to the ovaries.  COMPARISON:  None.  FINDINGS: Uterus  Measurements: 9.8 x 4.2 x 5.2 cm. No fibroids or other mass visualized.  Endometrium  Thickness: 3 mm.  No focal abnormality visualized.  Right ovary  Measurements: 3.2 x 1.8 x 2.1 cm. Normal appearance/no adnexal mass.  Left ovary  Measurements: 2.4 x 1.6 x 2.4 cm. There is a 1.9 cm simple unilocular left ovarian cyst. Otherwise normal left ovary.  Pulsed Doppler evaluation of both ovaries demonstrates normal low-resistance arterial and venous waveforms.  Other findings  No free fluid.  IMPRESSION: Normal   Electronically Signed   By: Ellery Plunk M.D.   On: 02/17/2015 04:36   [place] Art/ven Flow Abd Pelv Doppler  02/17/2015   CLINICAL DATA:  Right lower quadrant pain for 3 days.  EXAM: TRANSABDOMINAL AND TRANSVAGINAL ULTRASOUND OF PELVIS  DOPPLER ULTRASOUND OF OVARIES  TECHNIQUE: Both transabdominal and transvaginal ultrasound examinations of the pelvis were performed. Transabdominal technique was performed for global imaging of the pelvis including uterus, ovaries, adnexal regions, and pelvic cul-de-sac.  It was necessary to proceed with endovaginal exam following the transabdominal exam to visualize the right over. Color and duplex Doppler ultrasound was utilized to evaluate blood flow to the ovaries.  COMPARISON:  None.  FINDINGS: Uterus  Measurements: 9.8 x 4.2 x 5.2 cm. No fibroids or other mass visualized.  Endometrium  Thickness: 3 mm.  No focal abnormality visualized.  Right ovary  Measurements: 3.2 x 1.8 x 2.1 cm. Normal appearance/no adnexal mass.  Left ovary  Measurements: 2.4 x 1.6 x 2.4 cm. There is a 1.9 cm simple unilocular left ovarian cyst. Otherwise normal left ovary.  Pulsed Doppler evaluation of both ovaries demonstrates normal low-resistance arterial and venous waveforms.  Other findings  No free fluid.  IMPRESSION: Normal   Electronically Signed   By: Ellery Plunk M.D.   On:  02/17/2015 04:36    Imaging Review No results found.  EKG Interpretation None      MDM   Final diagnoses:  None    1. Pelvic pain  [place] negative. No discharge, or concerning lab studies indicative of infection. Doubt PID, no torsion.  No vomiting, fever, diarrhea or anorexia. No RLQ abdominal tenderness worrisome for for appendicitis. She is examined by Dr. Elesa Massed and found appropriate for discharge. Return precautions discussed.     Elpidio Anis, PA-C 02/17/15 2057  Layla Maw Ward, DO 02/17/15 2327

## 2015-02-17 NOTE — ED Notes (Signed)
Pt presents with c/o abdominal pain that started on Monday of this week. Pt denies any N/V/D. Pt reports the pain is right lower quadrant of her abdomen. Pt reports she still has her appendix.

## 2015-05-08 ENCOUNTER — Other Ambulatory Visit: Payer: Self-pay | Admitting: Obstetrics and Gynecology

## 2015-05-09 LAB — CYTOLOGY - PAP

## 2015-05-23 ENCOUNTER — Other Ambulatory Visit (HOSPITAL_COMMUNITY): Payer: Self-pay | Admitting: Obstetrics and Gynecology

## 2015-05-23 DIAGNOSIS — Z3046 Encounter for surveillance of implantable subdermal contraceptive: Secondary | ICD-10-CM

## 2015-05-25 ENCOUNTER — Ambulatory Visit (HOSPITAL_COMMUNITY)
Admission: RE | Admit: 2015-05-25 | Discharge: 2015-05-25 | Disposition: A | Discharge status: Home / Self Care | Payer: BLUE CROSS/BLUE SHIELD | Source: Ambulatory Visit | Attending: Obstetrics and Gynecology | Admitting: Obstetrics and Gynecology

## 2015-05-25 ENCOUNTER — Ambulatory Visit (HOSPITAL_COMMUNITY): Payer: Managed Care, Other (non HMO)

## 2015-05-25 DIAGNOSIS — Z3046 Encounter for surveillance of implantable subdermal contraceptive: Secondary | ICD-10-CM

## 2015-07-28 ENCOUNTER — Encounter (HOSPITAL_COMMUNITY): Payer: Self-pay

## 2015-07-28 ENCOUNTER — Emergency Department (HOSPITAL_COMMUNITY)
Admission: EM | Admit: 2015-07-28 | Discharge: 2015-07-28 | Disposition: A | Discharge status: Home / Self Care | Payer: BLUE CROSS/BLUE SHIELD | Attending: Emergency Medicine | Admitting: Emergency Medicine

## 2015-07-28 DIAGNOSIS — Z793 Long term (current) use of hormonal contraceptives: Secondary | ICD-10-CM | POA: Diagnosis not present

## 2015-07-28 DIAGNOSIS — Z8619 Personal history of other infectious and parasitic diseases: Secondary | ICD-10-CM | POA: Diagnosis not present

## 2015-07-28 DIAGNOSIS — J029 Acute pharyngitis, unspecified: Secondary | ICD-10-CM | POA: Diagnosis present

## 2015-07-28 DIAGNOSIS — J020 Streptococcal pharyngitis: Secondary | ICD-10-CM | POA: Diagnosis not present

## 2015-07-28 LAB — CBC WITH DIFFERENTIAL/PLATELET
Basophils Absolute: 0 10*3/uL (ref 0.0–0.1)
Basophils Relative: 0 %
Eosinophils Absolute: 0 10*3/uL (ref 0.0–0.7)
Eosinophils Relative: 0 %
HCT: 35.1 % — ABNORMAL LOW (ref 36.0–46.0)
Hemoglobin: 11.5 g/dL — ABNORMAL LOW (ref 12.0–15.0)
Lymphocytes Relative: 7 %
Lymphs Abs: 1.4 10*3/uL (ref 0.7–4.0)
MCH: 29.3 pg (ref 26.0–34.0)
MCHC: 32.8 g/dL (ref 30.0–36.0)
MCV: 89.5 fL (ref 78.0–100.0)
Monocytes Absolute: 1.3 10*3/uL — ABNORMAL HIGH (ref 0.1–1.0)
Monocytes Relative: 7 %
Neutro Abs: 16.1 10*3/uL — ABNORMAL HIGH (ref 1.7–7.7)
Neutrophils Relative %: 86 %
Platelets: 287 10*3/uL (ref 150–400)
RBC: 3.92 MIL/uL (ref 3.87–5.11)
RDW: 12.7 % (ref 11.5–15.5)
WBC: 18.9 10*3/uL — ABNORMAL HIGH (ref 4.0–10.5)

## 2015-07-28 LAB — I-STAT CHEM 8, ED
BUN: 10 mg/dL (ref 6–20)
Calcium, Ion: 1.15 mmol/L (ref 1.12–1.23)
Chloride: 104 mmol/L (ref 101–111)
Creatinine, Ser: 0.7 mg/dL (ref 0.44–1.00)
Glucose, Bld: 89 mg/dL (ref 65–99)
HCT: 40 % (ref 36.0–46.0)
Hemoglobin: 13.6 g/dL (ref 12.0–15.0)
Potassium: 3.3 mmol/L — ABNORMAL LOW (ref 3.5–5.1)
Sodium: 140 mmol/L (ref 135–145)
TCO2: 25 mmol/L (ref 0–100)

## 2015-07-28 LAB — URINE MICROSCOPIC-ADD ON

## 2015-07-28 LAB — URINALYSIS, ROUTINE W REFLEX MICROSCOPIC
Bilirubin Urine: NEGATIVE
Glucose, UA: NEGATIVE mg/dL
Ketones, ur: 40 mg/dL — AB
Leukocytes, UA: NEGATIVE
Nitrite: NEGATIVE
Protein, ur: NEGATIVE mg/dL
Specific Gravity, Urine: 1.027 (ref 1.005–1.030)
pH: 6 (ref 5.0–8.0)

## 2015-07-28 LAB — RAPID STREP SCREEN (MED CTR MEBANE ONLY): Streptococcus, Group A Screen (Direct): POSITIVE — AB

## 2015-07-28 LAB — MONONUCLEOSIS SCREEN: Mono Screen: NEGATIVE

## 2015-07-28 MED ORDER — HYDROCODONE-ACETAMINOPHEN 5-325 MG PO TABS: 2.0000 | ORAL_TABLET | ORAL | Status: DC | PRN

## 2015-07-28 MED ORDER — KETOROLAC TROMETHAMINE 30 MG/ML IJ SOLN
30.0000 mg | Freq: Once | INTRAMUSCULAR | Status: AC
  Administered 2015-07-28: 30 mg via INTRAVENOUS
  Filled 2015-07-28: qty 1

## 2015-07-28 MED ORDER — CEFUROXIME AXETIL 500 MG PO TABS: 500.0000 mg | ORAL_TABLET | Freq: Two times a day (BID) | ORAL | Status: DC

## 2015-07-28 MED ORDER — SODIUM CHLORIDE 0.9 % IV BOLUS (SEPSIS)
1000.0000 mL | Freq: Once | INTRAVENOUS | Status: AC
  Administered 2015-07-28: 1000 mL via INTRAVENOUS

## 2015-07-28 MED ORDER — CEFUROXIME AXETIL 500 MG PO TABS
500.0000 mg | ORAL_TABLET | Freq: Two times a day (BID) | ORAL | Status: DC
  Administered 2015-07-28: 500 mg via ORAL
  Filled 2015-07-28 (×3): qty 1

## 2015-07-28 NOTE — Discharge Instructions (Signed)
You still have positive strep test  You have been given another antibiotic to take for 10 day  Also a referral to the ENT specialist

## 2015-07-28 NOTE — ED Notes (Signed)
Pt dx with strep two weeks ago, completed antibiotics and doesn't feel any better

## 2015-07-28 NOTE — ED Provider Notes (Addendum)
CSN: [id number]     Arrival date & time 07/28/15  0150 History   First MD Initiated Contact with Patient 07/28/15 0210     Chief Complaint  Patient presents with  . Sore Throat     (Consider location/radiation/quality/duration/timing/severity/associated sxs/prior Treatment) HPI Comments: This is a 24 year old who was treated for strep throat 2 weeks ago with antibiotics.  She states she has not felt well since, has persistent low-grade temperatures, sore throat, myalgias  Patient is a 24 y.o. female presenting with pharyngitis. The history is provided by the patient.  Sore Throat This is a new problem. The current episode started in the past 7 days. The problem occurs constantly. The problem has been gradually worsening. Associated symptoms include fatigue, a fever, myalgias, a sore throat and swollen glands. Pertinent negatives include no abdominal pain, chest pain, congestion, coughing, nausea, rash or vomiting. The symptoms are aggravated by swallowing. She has tried nothing for the symptoms. The treatment provided no relief.    Past Medical History  Diagnosis Date  . Pregnant state, incidental   . Herpes    Past Surgical History  Procedure Laterality Date  . No past surgeries     Family History  Problem Relation Age of Onset  . Adopted: Yes  . Other Neg Hx    Social History  Substance Use Topics  . Smoking status: Never Smoker   . Smokeless tobacco: Never Used  . Alcohol Use: No   OB History    Gravida Para Term Preterm AB TAB SAB Ectopic Multiple Living   2 2 2       2      Review of Systems  Constitutional: Positive for fever and fatigue.  HENT: Positive for sore throat. Negative for congestion.   Respiratory: Negative for cough and shortness of breath.   Cardiovascular: Negative for chest pain.  Gastrointestinal: Negative for nausea, vomiting and abdominal pain.  Genitourinary: Negative for dysuria.  Musculoskeletal: Positive for myalgias.  Skin: Negative for  rash.      Allergies  Review of patient's allergies indicates no known allergies.  Home Medications   Prior to Admission medications   Medication Sig Start Date End Date Taking? Authorizing Provider  acetaminophen (TYLENOL) 500 MG tablet Take 1,000 mg by mouth every 6 (six) hours as needed for mild pain.   Yes Historical Provider, MD  etonogestrel (IMPLANON) 68 MG IMPL implant Inject 1 each into the skin once. December 2014   Yes Historical Provider, MD  cefUROXime (CEFTIN) 500 MG tablet Take 1 tablet (500 mg total) by mouth 2 (two) times daily with a meal. 07/28/15   Earley FavorGail Jamia Hoban, NP  HYDROcodone-acetaminophen (NORCO/VICODIN) 5-325 MG tablet Take 2 tablets by mouth every 4 (four) hours as needed. 07/28/15   Earley FavorGail Amiree No, NP  ibuprofen (ADVIL,MOTRIN) 800 MG tablet Take 1 tablet (800 mg total) by mouth 3 (three) times daily. Patient not taking: Reported on 07/28/2015 02/17/15   Elpidio AnisShari Upstill, PA-C  penicillin v potassium (VEETID) 500 MG tablet Take 1 tablet (500 mg total) by mouth 4 (four) times daily. Patient not taking: Reported on 02/17/2015 01/16/14   Junius FinnerErin O'Malley, PA-C   BP 109/96 mmHg  Pulse 102  Temp(Src) 99.4 F (37.4 C) (Oral)  Resp 18  Ht 5\' 4"  (1.626 m)  Wt 46.267 kg  BMI 17.50 kg/m2  SpO2 100%  LMP 07/05/2015 Physical Exam  Constitutional: She appears well-developed and well-nourished.  HENT:  Head: Normocephalic.  Right Ear: External ear normal.  Left Ear: External  ear normal.  Mouth/Throat: No oropharyngeal exudate.  Eyes: Pupils are equal, round, and reactive to light.  Cardiovascular: Regular rhythm.   Pulmonary/Chest: Effort normal.  Musculoskeletal: Normal range of motion.  Lymphadenopathy:    She has no cervical adenopathy.  Neurological: She is alert.  Skin: Skin is warm. No rash noted.  Nursing note and vitals reviewed.   ED Course  Procedures (including critical care time) Labs Review Labs Reviewed  RAPID STREP SCREEN (NOT AT [facility]) - Abnormal; Notable  for the following:    Streptococcus, Group A Screen (Direct) POSITIVE (*)    All other components within normal limits  CBC WITH DIFFERENTIAL/PLATELET - Abnormal; Notable for the following:    WBC 18.9 (*)    Hemoglobin 11.5 (*)    HCT 35.1 (*)    Neutro Abs 16.1 (*)    Monocytes Absolute 1.3 (*)    All other components within normal limits  URINALYSIS, ROUTINE W REFLEX MICROSCOPIC (NOT AT [facility]) - Abnormal; Notable for the following:    Hgb urine dipstick SMALL (*)    Ketones, ur 40 (*)    All other components within normal limits  URINE MICROSCOPIC-ADD ON - Abnormal; Notable for the following:    Squamous Epithelial / LPF 6-30 (*)    Bacteria, UA RARE (*)    All other components within normal limits  I-STAT CHEM 8, ED - Abnormal; Notable for the following:    Potassium 3.3 (*)    All other components within normal limits  MONONUCLEOSIS SCREEN    Imaging Review No results found. I have personally reviewed and evaluated these images and lab results as part of my medical decision-making.   EKG Interpretation None    resistant strep will treat with Ceftin for 10 days referral to ENT  MDM   Final diagnoses:  Strep throat         Earley Favor, NP 07/28/15 0102  Dione Booze, MD 07/28/15 7253  Earley Favor, NP 08/21/15 2003  Dione Booze, MD 08/24/15 1435

## 2015-09-07 ENCOUNTER — Other Ambulatory Visit (HOSPITAL_COMMUNITY): Payer: Self-pay | Admitting: Surgery

## 2015-09-07 DIAGNOSIS — Z975 Presence of (intrauterine) contraceptive device: Secondary | ICD-10-CM

## 2015-09-07 IMAGING — CR DG CHEST 2V
2 series · 2 of 2 positions shown · non-contrast
Comparison: None.

CLINICAL DATA: Cough, congestion

EXAM:
CHEST  2 VIEW

[w chest pa]
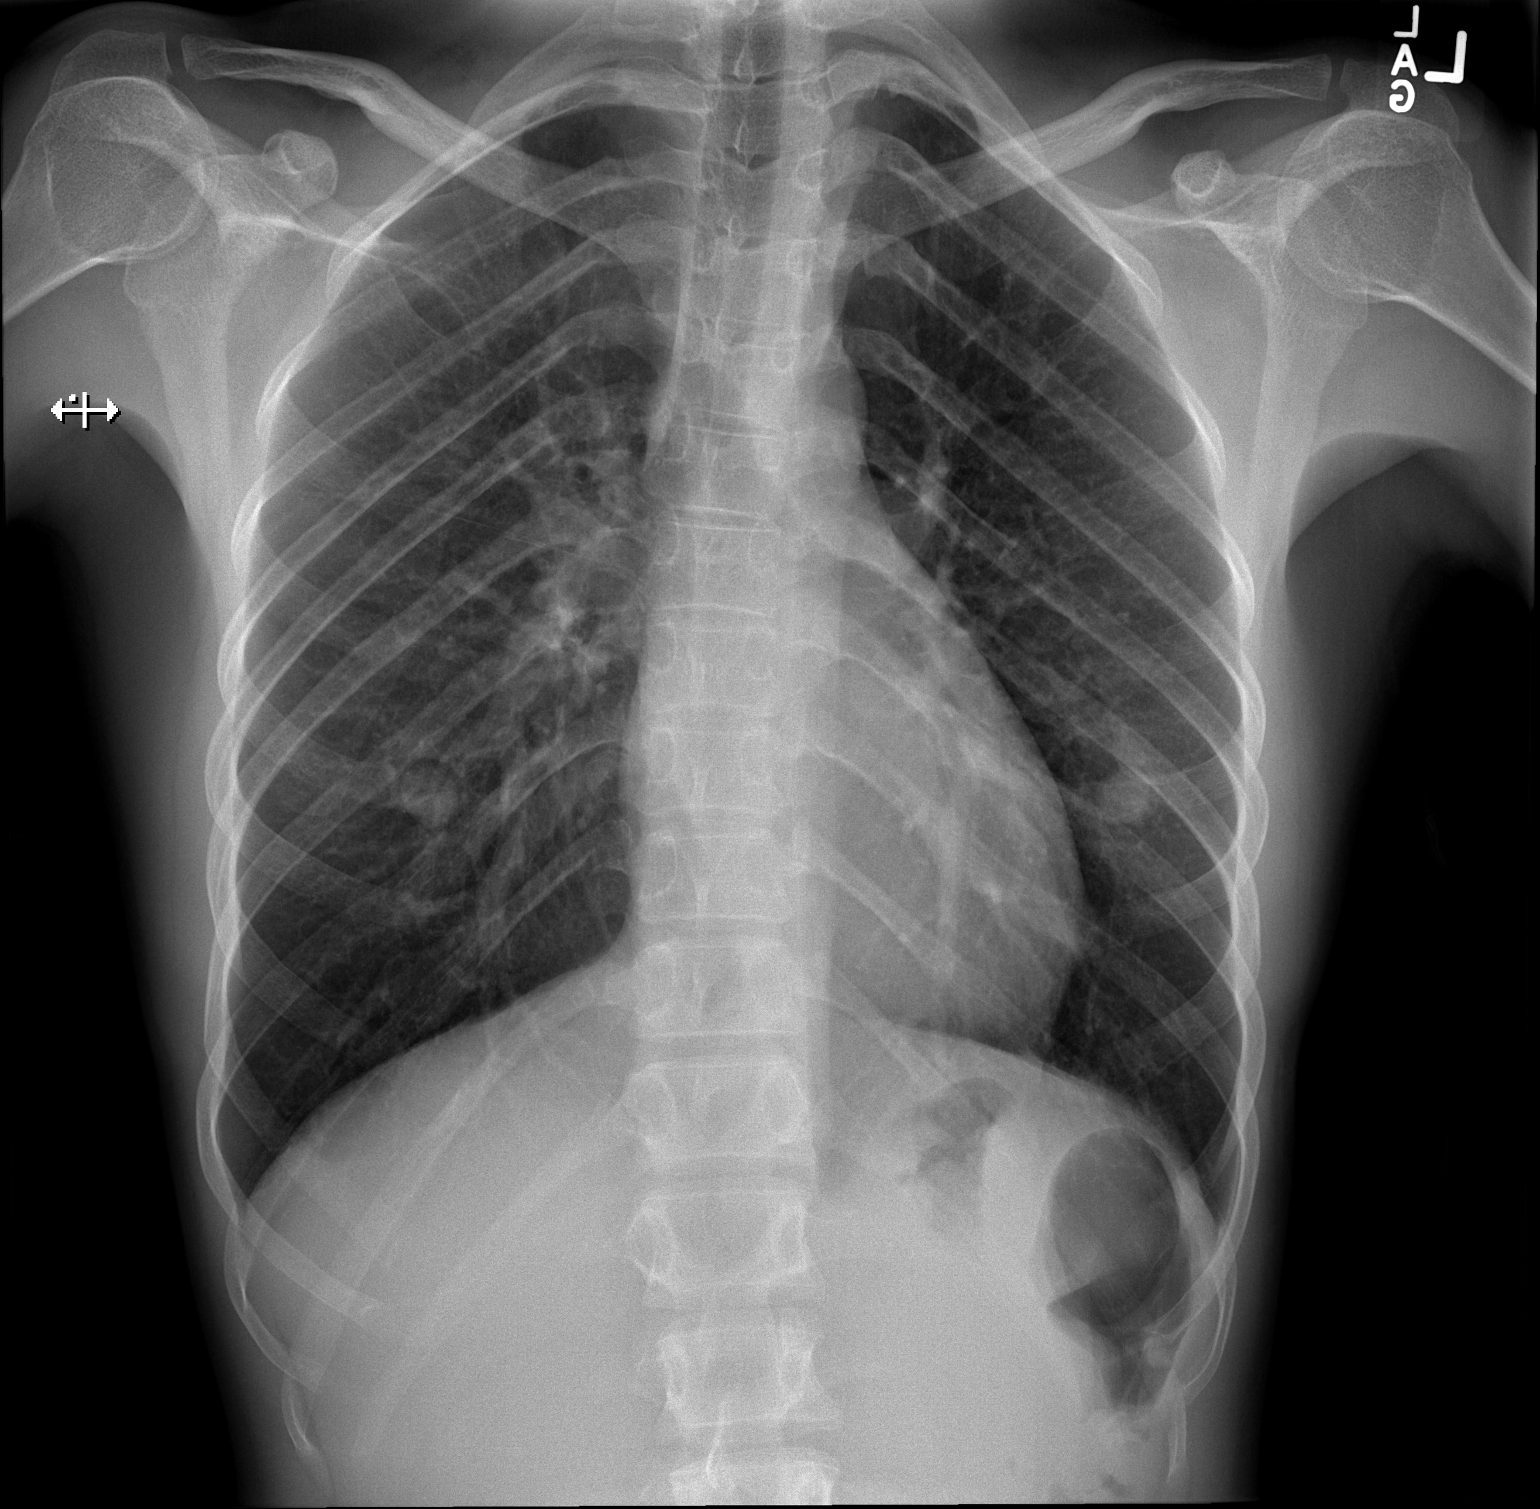

[w chest lat]
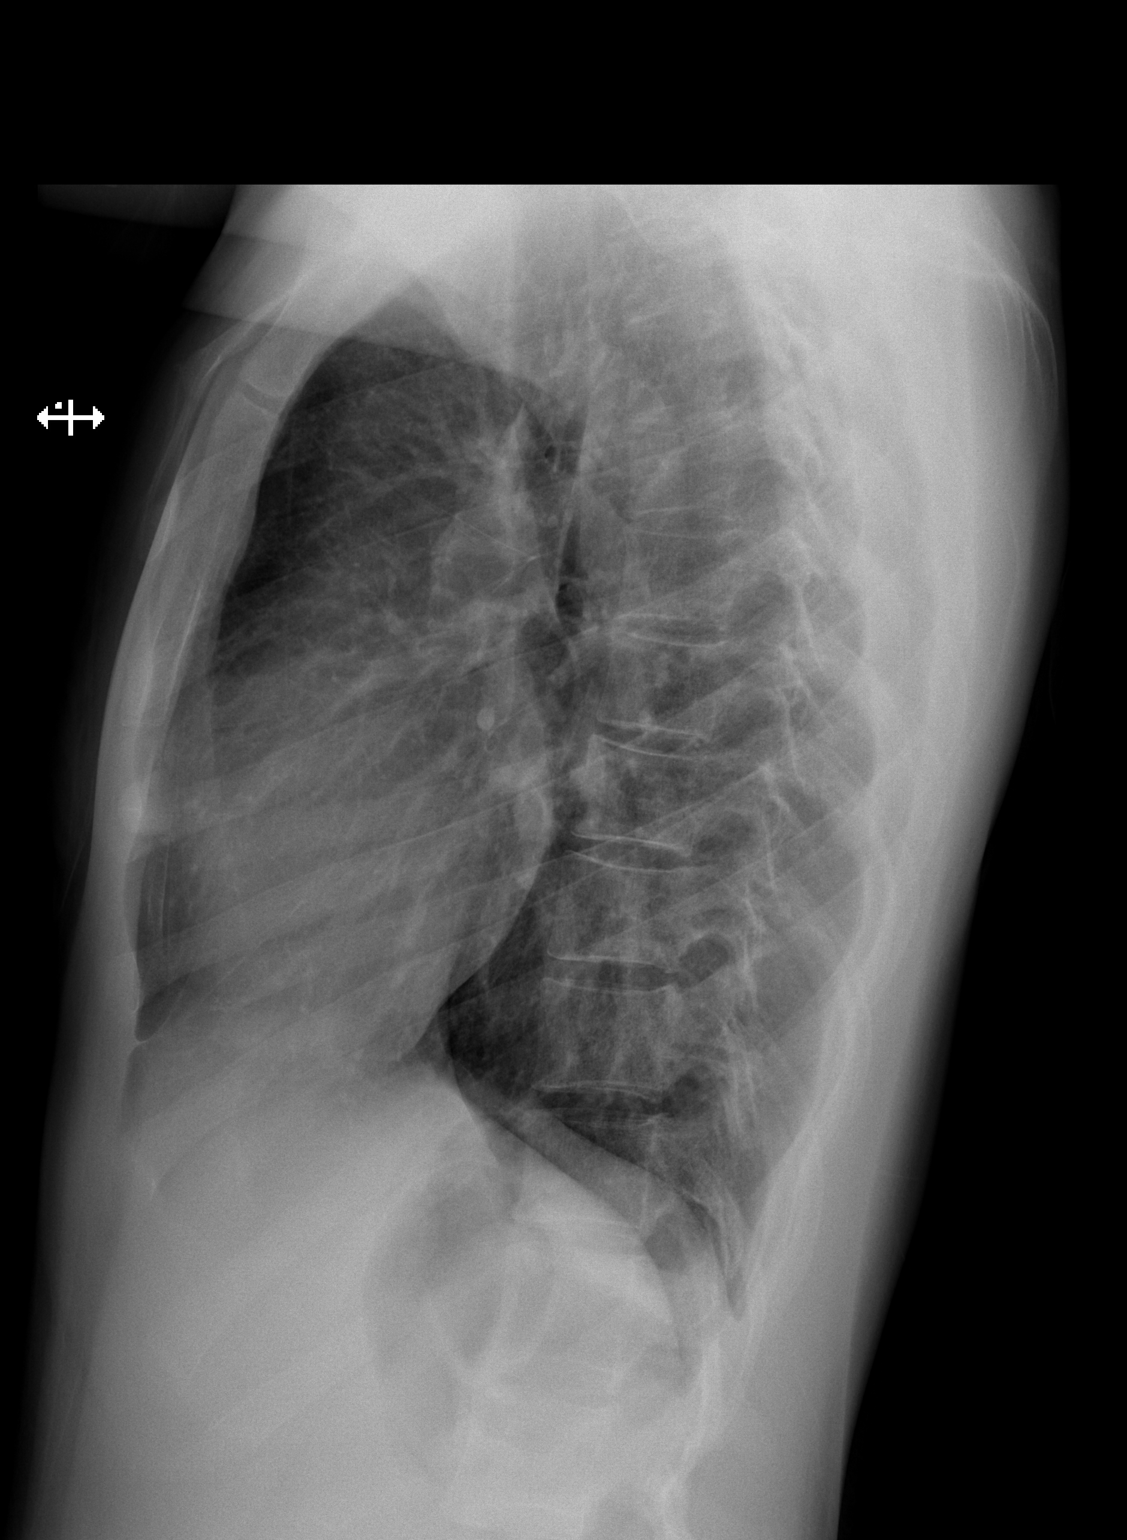

[2 of 2 positions shown; findings below may reference images not displayed]

FINDINGS: The heart size and mediastinal contours are within normal limits.
Both lungs are clear. The visualized skeletal structures are
unremarkable.
IMPRESSION: No active cardiopulmonary disease.

## 2015-09-11 ENCOUNTER — Encounter (HOSPITAL_BASED_OUTPATIENT_CLINIC_OR_DEPARTMENT_OTHER): Payer: Self-pay | Admitting: *Deleted

## 2015-09-15 ENCOUNTER — Ambulatory Visit: Payer: Self-pay | Admitting: Surgery

## 2015-09-18 ENCOUNTER — Ambulatory Visit (HOSPITAL_BASED_OUTPATIENT_CLINIC_OR_DEPARTMENT_OTHER): Payer: BLUE CROSS/BLUE SHIELD | Admitting: Anesthesiology

## 2015-09-18 ENCOUNTER — Ambulatory Visit (HOSPITAL_BASED_OUTPATIENT_CLINIC_OR_DEPARTMENT_OTHER)
Admission: RE | Admit: 2015-09-18 | Discharge: 2015-09-18 | Disposition: A | Discharge status: Home / Self Care | Payer: BLUE CROSS/BLUE SHIELD | Source: Ambulatory Visit | Attending: Surgery | Admitting: Surgery

## 2015-09-18 ENCOUNTER — Ambulatory Visit (HOSPITAL_COMMUNITY)
Admission: RE | Admit: 2015-09-18 | Discharge: 2015-09-18 | Disposition: A | Discharge status: Home / Self Care | Payer: BLUE CROSS/BLUE SHIELD | Source: Ambulatory Visit | Attending: Surgery | Admitting: Surgery

## 2015-09-18 ENCOUNTER — Encounter (HOSPITAL_BASED_OUTPATIENT_CLINIC_OR_DEPARTMENT_OTHER)
Admission: RE | Disposition: A | Discharge status: Home / Self Care | Payer: Self-pay | Source: Ambulatory Visit | Attending: Surgery

## 2015-09-18 ENCOUNTER — Encounter (HOSPITAL_BASED_OUTPATIENT_CLINIC_OR_DEPARTMENT_OTHER): Payer: Self-pay | Admitting: *Deleted

## 2015-09-18 DIAGNOSIS — Z4589 Encounter for adjustment and management of other implanted devices: Secondary | ICD-10-CM | POA: Diagnosis not present

## 2015-09-18 DIAGNOSIS — Z975 Presence of (intrauterine) contraceptive device: Secondary | ICD-10-CM

## 2015-09-18 HISTORY — PX: FOREIGN BODY REMOVAL: SHX962

## 2015-09-18 HISTORY — DX: Other specified health status: Z78.9

## 2015-09-18 SURGERY — REMOVAL FOREIGN BODY EXTREMITY
Anesthesia: Monitor Anesthesia Care | Site: Arm Upper | Laterality: Left

## 2015-09-18 MED ORDER — LIDOCAINE HCL (CARDIAC) 20 MG/ML IV SOLN
INTRAVENOUS | Status: AC
  Filled 2015-09-18: qty 5

## 2015-09-18 MED ORDER — HYDROCODONE-ACETAMINOPHEN 7.5-325 MG PO TABS: 1.0000 | ORAL_TABLET | Freq: Once | ORAL | Status: DC | PRN

## 2015-09-18 MED ORDER — CHLORHEXIDINE GLUCONATE 4 % EX LIQD: 1.0000 "application " | Freq: Once | CUTANEOUS | Status: DC

## 2015-09-18 MED ORDER — LACTATED RINGERS IV SOLN
INTRAVENOUS | Status: DC
  Administered 2015-09-18: 10 mL/h via INTRAVENOUS

## 2015-09-18 MED ORDER — LIDOCAINE HCL (PF) 1 % IJ SOLN
INTRAMUSCULAR | Status: AC
  Filled 2015-09-18: qty 30

## 2015-09-18 MED ORDER — ONDANSETRON HCL 4 MG/2ML IJ SOLN
INTRAMUSCULAR | Status: AC
  Filled 2015-09-18: qty 2

## 2015-09-18 MED ORDER — FENTANYL CITRATE (PF) 100 MCG/2ML IJ SOLN
50.0000 ug | INTRAMUSCULAR | Status: DC | PRN
  Administered 2015-09-18: 100 ug via INTRAVENOUS

## 2015-09-18 MED ORDER — PROMETHAZINE HCL 25 MG/ML IJ SOLN: 6.2500 mg | INTRAMUSCULAR | Status: DC | PRN

## 2015-09-18 MED ORDER — PROPOFOL 10 MG/ML IV BOLUS
INTRAVENOUS | Status: DC | PRN
  Administered 2015-09-18: 10 mg via INTRAVENOUS
  Administered 2015-09-18 (×5): 20 mg via INTRAVENOUS
  Administered 2015-09-18 (×2): 10 mg via INTRAVENOUS
  Administered 2015-09-18: 20 mg via INTRAVENOUS

## 2015-09-18 MED ORDER — BUPIVACAINE HCL (PF) 0.5 % IJ SOLN
INTRAMUSCULAR | Status: DC | PRN
  Administered 2015-09-18: 2 mL

## 2015-09-18 MED ORDER — HYDROCODONE-ACETAMINOPHEN 5-325 MG PO TABS: 1.0000 | ORAL_TABLET | ORAL | Status: DC | PRN

## 2015-09-18 MED ORDER — KETOROLAC TROMETHAMINE 30 MG/ML IJ SOLN: 23.0000 mg | Freq: Once | INTRAMUSCULAR | Status: DC

## 2015-09-18 MED ORDER — FENTANYL CITRATE (PF) 100 MCG/2ML IJ SOLN: 25.0000 ug | INTRAMUSCULAR | Status: DC | PRN

## 2015-09-18 MED ORDER — PROPOFOL 10 MG/ML IV BOLUS
INTRAVENOUS | Status: AC
  Filled 2015-09-18: qty 20

## 2015-09-18 MED ORDER — LIDOCAINE HCL (CARDIAC) 20 MG/ML IV SOLN
INTRAVENOUS | Status: DC | PRN
  Administered 2015-09-18: 50 mg via INTRAVENOUS

## 2015-09-18 MED ORDER — LIDOCAINE-EPINEPHRINE (PF) 1 %-1:200000 IJ SOLN
INTRAMUSCULAR | Status: DC | PRN
  Administered 2015-09-18: 2 mL

## 2015-09-18 MED ORDER — BUPIVACAINE HCL (PF) 0.5 % IJ SOLN
INTRAMUSCULAR | Status: AC
  Filled 2015-09-18: qty 30

## 2015-09-18 MED ORDER — DEXAMETHASONE SODIUM PHOSPHATE 10 MG/ML IJ SOLN
INTRAMUSCULAR | Status: AC
  Filled 2015-09-18: qty 1

## 2015-09-18 MED ORDER — GLYCOPYRROLATE 0.2 MG/ML IJ SOLN: 0.2000 mg | Freq: Once | INTRAMUSCULAR | Status: DC | PRN

## 2015-09-18 MED ORDER — MIDAZOLAM HCL 2 MG/2ML IJ SOLN
INTRAMUSCULAR | Status: AC
  Filled 2015-09-18: qty 2

## 2015-09-18 MED ORDER — MIDAZOLAM HCL 2 MG/2ML IJ SOLN
1.0000 mg | INTRAMUSCULAR | Status: DC | PRN
  Administered 2015-09-18: 2 mg via INTRAVENOUS

## 2015-09-18 MED ORDER — SCOPOLAMINE 1 MG/3DAYS TD PT72: 1.0000 | MEDICATED_PATCH | Freq: Once | TRANSDERMAL | Status: DC | PRN

## 2015-09-18 MED ORDER — FENTANYL CITRATE (PF) 100 MCG/2ML IJ SOLN
INTRAMUSCULAR | Status: AC
  Filled 2015-09-18: qty 2

## 2015-09-18 MED ORDER — LIDOCAINE-EPINEPHRINE (PF) 1 %-1:200000 IJ SOLN
INTRAMUSCULAR | Status: AC
  Filled 2015-09-18: qty 30

## 2015-09-18 SURGICAL SUPPLY — 47 items
BENZOIN TINCTURE PRP APPL 2/3 (GAUZE/BANDAGES/DRESSINGS) IMPLANT
BLADE CLIPPER SURG (BLADE) IMPLANT
BLADE SURG 15 STRL LF DISP TIS (BLADE) ×2 IMPLANT
BLADE SURG 15 STRL SS (BLADE) ×3
CANISTER SUCT 1200ML W/VALVE (MISCELLANEOUS) IMPLANT
CLEANER CAUTERY TIP 5X5 PAD (MISCELLANEOUS) ×2 IMPLANT
COVER BACK TABLE 60X90IN (DRAPES) ×3 IMPLANT
COVER MAYO STAND STRL (DRAPES) ×3 IMPLANT
DECANTER SPIKE VIAL GLASS SM (MISCELLANEOUS) IMPLANT
DRAPE LAPAROTOMY 100X72 PEDS (DRAPES) ×3 IMPLANT
DRAPE U-SHAPE 76X120 STRL (DRAPES) IMPLANT
DRSG TEGADERM 2-3/8X2-3/4 SM (GAUZE/BANDAGES/DRESSINGS) IMPLANT
ELECT REM PT RETURN 9FT ADLT (ELECTROSURGICAL) ×3
ELECTRODE REM PT RTRN 9FT ADLT (ELECTROSURGICAL) ×2 IMPLANT
GLOVE BIO SURGEON STRL SZ8 (GLOVE) ×3 IMPLANT
GLOVE BIOGEL M 7.0 STRL (GLOVE) ×3 IMPLANT
GLOVE BIOGEL PI IND STRL 7.5 (GLOVE) ×2 IMPLANT
GLOVE BIOGEL PI INDICATOR 7.5 (GLOVE) ×1
GLOVE SURG SS PI 7.0 STRL IVOR (GLOVE) ×3 IMPLANT
GOWN STRL REUS W/ TWL LRG LVL3 (GOWN DISPOSABLE) ×2 IMPLANT
GOWN STRL REUS W/ TWL XL LVL3 (GOWN DISPOSABLE) ×2 IMPLANT
GOWN STRL REUS W/TWL LRG LVL3 (GOWN DISPOSABLE) ×3
GOWN STRL REUS W/TWL XL LVL3 (GOWN DISPOSABLE) ×3
LIQUID BAND (GAUZE/BANDAGES/DRESSINGS) ×3 IMPLANT
NEEDLE HYPO 25X1 1.5 SAFETY (NEEDLE) ×3 IMPLANT
NEEDLE PRECISIONGLIDE 27X1.5 (NEEDLE) IMPLANT
NS IRRIG 1000ML POUR BTL (IV SOLUTION) IMPLANT
PACK BASIN DAY SURGERY FS (CUSTOM PROCEDURE TRAY) ×3 IMPLANT
PAD CLEANER CAUTERY TIP 5X5 (MISCELLANEOUS) ×3
PENCIL BUTTON HOLSTER BLD 10FT (ELECTRODE) ×3 IMPLANT
SPONGE GAUZE 2X2 8PLY STRL LF (GAUZE/BANDAGES/DRESSINGS) IMPLANT
SPONGE GAUZE 4X4 12PLY STER LF (GAUZE/BANDAGES/DRESSINGS) IMPLANT
STRIP CLOSURE SKIN 1/2X4 (GAUZE/BANDAGES/DRESSINGS) IMPLANT
SUT ETHILON 3 0 FSL (SUTURE) IMPLANT
SUT ETHILON 5 0 PS 2 18 (SUTURE) IMPLANT
SUT VIC AB 3-0 SH 27 (SUTURE)
SUT VIC AB 3-0 SH 27X BRD (SUTURE) IMPLANT
SUT VIC AB 4-0 SH 18 (SUTURE) ×3 IMPLANT
SUT VIC AB 5-0 PS2 18 (SUTURE) IMPLANT
SUT VICRYL 3-0 CR8 SH (SUTURE) IMPLANT
SYR BULB 3OZ (MISCELLANEOUS) ×3 IMPLANT
SYR CONTROL 10ML LL (SYRINGE) ×3 IMPLANT
TOWEL OR 17X24 6PK STRL BLUE (TOWEL DISPOSABLE) ×3 IMPLANT
TRAY DSU PREP LF (CUSTOM PROCEDURE TRAY) ×3 IMPLANT
TUBE CONNECTING 20X1/4 (TUBING) IMPLANT
UNDERPAD 30X30 (UNDERPADS AND DIAPERS) IMPLANT
YANKAUER SUCT BULB TIP NO VENT (SUCTIONS) IMPLANT

## 2015-09-18 NOTE — H&P (Signed)
Chief Complaint:  Retained Implanon in left arm  History of Present Illness:  Nicole Howard is an 25 y.o. female who had a birth control device, Implanon, placed and was unable to be removed.  MRI localization requested but not performed.  Crude measurements will be followed and patient aware that this may be unsuccessful.  Will proceed  Past Medical History  Diagnosis Date  . Medical history non-contributory     Past Surgical History  Procedure Laterality Date  . No past surgeries      Current Facility-Administered Medications  Medication Dose Route Frequency Provider Last Rate Last Dose  . chlorhexidine (HIBICLENS) 4 % liquid 1 application  1 application Topical Once Luretha Murphy, MD      . Melene Muller ON 09/19/2015] chlorhexidine (HIBICLENS) 4 % liquid 1 application  1 application Topical Once Luretha Murphy, MD      . fentaNYL (SUBLIMAZE) injection 50-100 mcg  50-100 mcg Intravenous PRN Sheldon Silvan, MD      . glycopyrrolate (ROBINUL) injection 0.2 mg  0.2 mg Intravenous Once PRN Sheldon Silvan, MD      . lactated ringers infusion   Intravenous Continuous Sheldon Silvan, MD 10 mL/hr at 09/18/15 1123 10 mL/hr at 09/18/15 1123  . midazolam (VERSED) injection 1-2 mg  1-2 mg Intravenous PRN Sheldon Silvan, MD      . scopolamine (TRANSDERM-SCOP) 1 MG/3DAYS 1.5 mg  1 patch Transdermal Once PRN Sheldon Silvan, MD       Review of patient's allergies indicates no known allergies. Family History  Problem Relation Age of Onset  . Adopted: Yes  . Other Neg Hx    Social History:   reports that she has never smoked. She has never used smokeless tobacco. She reports that she does not drink alcohol or use illicit drugs.   REVIEW OF SYSTEMS : Negative except for see problem list  Physical Exam:   Blood pressure 126/73, pulse 60, temperature 99.1 F (37.3 C), temperature source Oral, resp. rate 18, height  (1.626 m), weight 46.267 kg (102 lb), last menstrual period 09/11/2015, SpO2 100 %. Body mass  index is 17.5 kg/(m^2).  Gen:  WDWN AAF NAD  Neurological: Alert and oriented to person, place, and time. Motor and sensory function is grossly intact  Head: Normocephalic and atraumatic.  Eyes: Conjunctivae are normal. Pupils are equal, round, and reactive to light. No scleral icterus.  Neck: Normal range of motion. Neck supple. No tracheal deviation or thyromegaly present.  Cardiovascular:  SR without murmurs or gallops.  No carotid bruits Breast:  Not examined Respiratory: Effort normal.  No respiratory distress. No chest wall tenderness. Breath sounds normal.  No wheezes, rales or rhonchi.  Abdomen:  Not examined GU:  Not examined Musculoskeletal: Normal range of motion. Extremities are nontender. No cyanosis, edema or clubbing noted--there are at least 3 cuts on the medial aspect of her left inner arm.   Lymphadenopathy: No cervical, preauricular, postauricular or axillary adenopathy is present Skin: Skin is warm and dry. No rash noted. No diaphoresis. No erythema. No pallor. Pscyh: Normal mood and affect. Behavior is normal. Judgment and thought content normal.   LABORATORY RESULTS: No results found for this or any previous visit (from the past 48 hour(s)).   RADIOLOGY RESULTS: Mr Humerus Left Wo Contrast  09/18/2015  CLINICAL DATA:  Patient with Implanon device in the left upper arm. The device is to be removed. Localization study. Initial encounter. EXAM: MRI OF THE LEFT HUMERUS WITHOUT CONTRAST TECHNIQUE: Multiplanar, multisequence  MR imaging was performed. No intravenous contrast was administered. COMPARISON:  None. FINDINGS: The patient's Implanon device identified in the medial soft tissues of the upper arm of immediately adjacent to the anterior aspect of the medial margin of the basilic vein. The device is centered 14 cm above the medial epicondyle of the humerus. See axial images 30 to 38 of series 9 and coronal image 8 of series 6. All imaged muscles appear intact and normal.  Bone marrow signal is normal throughout. IMPRESSION: Implanon device is centered 14 cm above the medial epicondyle of the humerus and is immediately adjacent to the anterior aspect of the medial margin of the basilic vein. The device contacts the wall of the basilic vein. Electronically Signed   By: Drusilla Kanner M.D.   On: 09/18/2015 10:43    Problem List: Patient Active Problem List   Diagnosis Date Noted  . Active labor 06/01/2012  . Normal delivery 06/01/2012    Assessment & Plan: Implanon retained. Will attempt exploration.     Matt B. Daphine Deutscher, MD, [facility] [phone] beeper [phone]  09/18/2015 12:34 PM

## 2015-09-18 NOTE — Anesthesia Postprocedure Evaluation (Signed)
Anesthesia Post Note  Patient: Nicole Howard  Procedure(s) Performed: Procedure(s) (LRB): FOREIGN BODY REMOVAL OF BIRTH CONTROL DEVICE IN LEFT ARM ADULT (Left)  Patient location during evaluation: PACU Anesthesia Type: MAC Level of consciousness: awake and alert Pain management: pain level controlled Vital Signs Assessment: post-procedure vital signs reviewed and stable Respiratory status: spontaneous breathing Cardiovascular status: blood pressure returned to baseline Anesthetic complications: no    Last Vitals:  Filed Vitals:   09/18/15 1345 09/18/15 1400  BP: 110/73 115/83  Pulse: 55 55  Temp:    Resp: 15 11    Last Pain:  Filed Vitals:   09/18/15 1409  PainSc: 0-No pain                 Kennieth Rad

## 2015-09-18 NOTE — Transfer of Care (Signed)
Immediate Anesthesia Transfer of Care Note  Patient: Nicole Howard  Procedure(s) Performed: Procedure(s): FOREIGN BODY REMOVAL OF BIRTH CONTROL DEVICE IN LEFT ARM ADULT (Left)  Patient Location: PACU  Anesthesia Type:MAC  Level of Consciousness: awake, alert  and oriented  Airway & Oxygen Therapy: Patient Spontanous Breathing and Patient connected to face mask oxygen  Post-op Assessment: Report given to RN and Post -op Vital signs reviewed and stable  Post vital signs: Reviewed and stable  Last Vitals:  Filed Vitals:   09/18/15 1123  BP: 126/73  Pulse: 60  Temp: 37.3 C  Resp: 18    Complications: No apparent anesthesia complications

## 2015-09-18 NOTE — Discharge Instructions (Signed)
May shower The Implanon is out Return to see Dr. Daphine Deutscher if needed   Post Anesthesia Home Care Instructions  Activity: Get plenty of rest for the remainder of the day. A responsible adult should stay with you for 24 hours following the procedure.  For the next 24 hours, DO NOT: -Drive a car -Advertising copywriter -Drink alcoholic beverages -Take any medication unless instructed by your physician -Make any legal decisions or sign important papers.  Meals: Start with liquid foods such as gelatin or soup. Progress to regular foods as tolerated. Avoid greasy, spicy, heavy foods. If nausea and/or vomiting occur, drink only clear liquids until the nausea and/or vomiting subsides. Call your physician if vomiting continues.  Special Instructions/Symptoms: Your throat may feel dry or sore from the anesthesia or the breathing tube placed in your throat during surgery. If this causes discomfort, gargle with warm salt water. The discomfort should disappear within 24 hours.  If you had a scopolamine patch placed behind your ear for the management of post- operative nausea and/or vomiting:  1. The medication in the patch is effective for 72 hours, after which it should be removed.  Wrap patch in a tissue and discard in the trash. Wash hands thoroughly with soap and water. 2. You may remove the patch earlier than 72 hours if you experience unpleasant side effects which may include dry mouth, dizziness or visual disturbances. 3. Avoid touching the patch. Wash your hands with soap and water after contact with the patch.   Call your surgeon if you experience:   1.  Fever over 101.0. 2.  Inability to urinate. 3.  Nausea and/or vomiting. 4.  Extreme swelling or bruising at the surgical site. 5.  Continued bleeding from the incision. 6.  Increased pain, redness or drainage from the incision. 7.  Problems related to your pain medication. 8. Any change in color, movement and/or sensation 9. Any problems  and/or concerns

## 2015-09-18 NOTE — Op Note (Signed)
Surgeon: Wenda Low, MD, FACS  Asst:  none  Anes:  Local MAC  Procedure: Explantation of Implanon birth control device  Diagnosis: Retained Implanon  Complications: none  EBL:   minimal cc  Drains: none  Description of Procedure:  The patient was taken to OR 5 at [facility].  I first marked mapped the arm with ultrasound to corroborate my measurement.  After anesthesia was administered and the patient was prepped a timeout was performed.  I had marked a spot 14.1 cm from the medial epicondyle which corresponded to the MR scan from this am.  The area was infiltrated with marcaine and lidocaine and with her under MAC she was comfortable.  A transverse incision was made and I dissected down to the device which was deeper than expected and it was explanted in toto.  The wound was closed with 4-0 vicryl and Dermabond.      The patient tolerated the procedure well and was taken to the PACU in stable condition.     Matt B. Daphine Deutscher, MD, [facility], [place] [phone]

## 2015-09-18 NOTE — Anesthesia Preprocedure Evaluation (Addendum)
Anesthesia Evaluation  Patient identified by MRN, date of birth, ID band Patient awake    Reviewed: Allergy & Precautions, NPO status , Patient's Chart, lab work & pertinent test results  Airway Mallampati: I  TM Distance: >3 FB Neck ROM: Full    Dental   Pulmonary neg pulmonary ROS,    breath sounds clear to auscultation       Cardiovascular negative cardio ROS   Rhythm:Regular Rate:Normal     Neuro/Psych negative neurological ROS     GI/Hepatic negative GI ROS, Neg liver ROS,   Endo/Other  negative endocrine ROS  Renal/GU negative Renal ROS     Musculoskeletal   Abdominal   Peds  Hematology negative hematology ROS (+)   Anesthesia Other Findings   Reproductive/Obstetrics                            Anesthesia Physical Anesthesia Plan  ASA: I  Anesthesia Plan: MAC   Post-op Pain Management:    Induction: Intravenous  Airway Management Planned: Natural Airway and Simple Face Mask  Additional Equipment:   Intra-op Plan:   Post-operative Plan:   Informed Consent: I have reviewed the patients History and Physical, chart, labs and discussed the procedure including the risks, benefits and alternatives for the proposed anesthesia with the patient or authorized representative who has indicated his/her understanding and acceptance.     Plan Discussed with: CRNA  Anesthesia Plan Comments:         Anesthesia Quick Evaluation

## 2015-09-19 ENCOUNTER — Encounter (HOSPITAL_BASED_OUTPATIENT_CLINIC_OR_DEPARTMENT_OTHER): Payer: Self-pay | Admitting: Surgery

## 2015-09-21 ENCOUNTER — Encounter (HOSPITAL_BASED_OUTPATIENT_CLINIC_OR_DEPARTMENT_OTHER): Payer: Self-pay | Admitting: Surgery

## 2015-09-24 ENCOUNTER — Emergency Department (HOSPITAL_COMMUNITY)
Admission: EM | Admit: 2015-09-24 | Discharge: 2015-09-24 | Disposition: A | Discharge status: Home / Self Care | Payer: BLUE CROSS/BLUE SHIELD | Attending: Emergency Medicine | Admitting: Emergency Medicine

## 2015-09-24 ENCOUNTER — Encounter (HOSPITAL_COMMUNITY): Payer: Self-pay | Admitting: Emergency Medicine

## 2015-09-24 DIAGNOSIS — L7632 Postprocedural hematoma of skin and subcutaneous tissue following other procedure: Secondary | ICD-10-CM | POA: Insufficient documentation

## 2015-09-24 DIAGNOSIS — M79602 Pain in left arm: Secondary | ICD-10-CM | POA: Diagnosis present

## 2015-09-24 DIAGNOSIS — T148XXA Other injury of unspecified body region, initial encounter: Secondary | ICD-10-CM

## 2015-09-24 DIAGNOSIS — Y658 Other specified misadventures during surgical and medical care: Secondary | ICD-10-CM | POA: Diagnosis not present

## 2015-09-24 DIAGNOSIS — G8918 Other acute postprocedural pain: Secondary | ICD-10-CM

## 2015-09-24 MED ORDER — HYDROCODONE-ACETAMINOPHEN 5-325 MG PO TABS
1.0000 | ORAL_TABLET | Freq: Once | ORAL | Status: AC
  Administered 2015-09-24: 1 via ORAL
  Filled 2015-09-24: qty 1

## 2015-09-24 NOTE — Discharge Instructions (Signed)
Please follow-up with your surgeon for a repeat evaluation of ER postop wound. It does not appear infected today. Return without fail for worsening symptoms, including fever, increased swelling/redness/hard and skin/pus drainage from around her wound. Keep compression dressing around your arm, ice at rest, and continue to take Motrin and Tylenol as needed for pain control.

## 2015-09-24 NOTE — ED Notes (Signed)
Patient c/o right arm pain, had surgery to remove Implanon implant from left anterior upper arm. Area is ecchymotic, edematous, with firm nodule under surgical site.

## 2015-09-24 NOTE — ED Provider Notes (Signed)
CSN: [id number]     Arrival date & time 09/24/15  1124 History   First MD Initiated Contact with Patient 09/24/15 1226     Chief Complaint  Patient presents with  . Arm Pain     (Consider location/radiation/quality/duration/timing/severity/associated sxs/prior Treatment) HPI 25 year old female who presents with left arm pain. Had Implanon implant removed by Dr. Daphine Deutscher on 09/18/15. States she had bruising, pain and swelling that has been persistent since surgery over site of Implanon removal. No fever, chills, overlying skin redness, drainage, nausea or vomiting, chest pain or shortness of breath.     Past Medical History  Diagnosis Date  . Medical history non-contributory    Past Surgical History  Procedure Laterality Date  . No past surgeries    . Foreign body removal Left 09/18/2015    Procedure: FOREIGN BODY REMOVAL OF BIRTH CONTROL DEVICE IN LEFT ARM ADULT;  Surgeon: Luretha Murphy, MD;  Location: [facility];  Service: General;  Laterality: Left;   Family History  Problem Relation Age of Onset  . Adopted: Yes  . Other Neg Hx    Social History  Substance Use Topics  . Smoking status: Never Smoker   . Smokeless tobacco: Never Used  . Alcohol Use: No   OB History    Gravida Para Term Preterm AB TAB SAB Ectopic Multiple Living   Review of Systems 10/14 systems reviewed and are negative other than those stated in the HPI  Allergies  Review of patient's allergies indicates no known allergies.  Home Medications   Prior to Admission medications   Medication Sig Start Date End Date Taking? Authorizing Provider  HYDROcodone-acetaminophen (NORCO) 5-325 MG tablet Take 1 tablet by mouth every 4 (four) hours as needed for moderate pain. Patient not taking: Reported on 09/24/2015 09/18/15   Luretha Murphy, MD   BP 129/70 mmHg  Pulse 77  Temp(Src) 98.1 F (36.7 C) (Oral)  Resp 17  SpO2 100%  LMP 09/11/2015 Physical Exam Physical Exam   Nursing note and vitals reviewed. Constitutional: Well developed, well nourished, non-toxic, and in no acute distress Head: Normocephalic and atraumatic.  Mouth/Throat: Oropharynx is clear and moist.  Neck: Normal range of motion. Neck supple.  Cardiovascular: Normal rate and regular rhythm.   Pulmonary/Chest: Effort normal and breath sounds normal.  Abdominal: Soft. There is no tenderness. There is no rebound and no guarding.  Musculoskeletal: No deformity  Neurological: Alert, no facial droop, fluent speech, in tact innervation involving axillary, radial, ulnar, and median nerves of the left upper extremity Skin: Skin is warm and dry. over medial aspect of the left upper arm, there is area of bruising, 2 x 2 cm palpable area of swelling, no overlying erythema or drainage of pus Psychiatric: Cooperative  ED Course  Procedures (including critical care time) Labs Review Labs Reviewed - No data to display  Imaging Review No results found. I have personally reviewed and evaluated these images and lab results as part of my medical decision-making.   EKG Interpretation None      MDM   Final diagnoses:  Bruising  Post-operative pain  Hematoma   25 year old with persistent arm bruising and pain after Implanon removal one week ago. VS stable and she is well appearing. Extremity neurovascularly in tact. With bruising of the medial aspect of left arm with surgical wound clean dry in tact. Under wound, there is 2x2 cm  palpable area of swelling. Bedside US does reveal small pocket of fluid right underneath the surgical wound, appearing mildly heterogenous. This is consistent with hematoma. I do not feel this is consistent with abscess. No overlying skin changes or erythema. No systemic symptoms of infection. Discussed icing and supportive care. She will follow-up with her surgeon for wound re-evaluation this week. Discussed strict return and follow-up instructions.    Lavera Guise,  MD 09/24/15 [phone]

## 2015-09-24 NOTE — ED Notes (Signed)
Pt provided with ice pack per order

## 2015-11-20 ENCOUNTER — Emergency Department (HOSPITAL_COMMUNITY): Payer: Self-pay

## 2015-11-20 ENCOUNTER — Emergency Department (HOSPITAL_COMMUNITY)
Admission: EM | Admit: 2015-11-20 | Discharge: 2015-11-20 | Disposition: A | Discharge status: Home / Self Care | Payer: Self-pay | Attending: Emergency Medicine | Admitting: Emergency Medicine

## 2015-11-20 ENCOUNTER — Encounter (HOSPITAL_COMMUNITY): Payer: Self-pay | Admitting: Emergency Medicine

## 2015-11-20 DIAGNOSIS — J111 Influenza due to unidentified influenza virus with other respiratory manifestations: Secondary | ICD-10-CM | POA: Insufficient documentation

## 2015-11-20 DIAGNOSIS — R69 Illness, unspecified: Secondary | ICD-10-CM

## 2015-11-20 DIAGNOSIS — Z3202 Encounter for pregnancy test, result negative: Secondary | ICD-10-CM | POA: Insufficient documentation

## 2015-11-20 LAB — COMPREHENSIVE METABOLIC PANEL
ALT: 16 U/L (ref 14–54)
AST: 18 U/L (ref 15–41)
Albumin: 4.4 g/dL (ref 3.5–5.0)
Alkaline Phosphatase: 68 U/L (ref 38–126)
Anion gap: 9 (ref 5–15)
BUN: 8 mg/dL (ref 6–20)
CO2: 25 mmol/L (ref 22–32)
Calcium: 9 mg/dL (ref 8.9–10.3)
Chloride: 100 mmol/L — ABNORMAL LOW (ref 101–111)
Creatinine, Ser: 0.82 mg/dL (ref 0.44–1.00)
GFR calc Af Amer: >60 mL/min (ref >60)
GFR calc non Af Amer: >60 mL/min (ref >60)
Glucose, Bld: 108 mg/dL — ABNORMAL HIGH (ref 65–99)
Potassium: 3.5 mmol/L (ref 3.5–5.1)
Sodium: 134 mmol/L — ABNORMAL LOW (ref 135–145)
Total Bilirubin: 0.8 mg/dL (ref 0.3–1.2)
Total Protein: 8.3 g/dL — ABNORMAL HIGH (ref 6.5–8.1)

## 2015-11-20 LAB — I-STAT BETA HCG BLOOD, ED (MC, WL, AP ONLY): I-stat hCG, quantitative: <5 m[IU]/mL (ref <5)

## 2015-11-20 LAB — URINALYSIS, ROUTINE W REFLEX MICROSCOPIC
Bilirubin Urine: NEGATIVE
Glucose, UA: NEGATIVE mg/dL
Hgb urine dipstick: NEGATIVE
Ketones, ur: NEGATIVE mg/dL
Leukocytes, UA: NEGATIVE
Nitrite: NEGATIVE
Protein, ur: NEGATIVE mg/dL
Specific Gravity, Urine: 1.027 (ref 1.005–1.030)
pH: 7 (ref 5.0–8.0)

## 2015-11-20 LAB — CBC WITH DIFFERENTIAL/PLATELET
Basophils Absolute: 0 10*3/uL (ref 0.0–0.1)
Basophils Relative: 0 %
Eosinophils Absolute: 0 10*3/uL (ref 0.0–0.7)
Eosinophils Relative: 0 %
HCT: 39.9 % (ref 36.0–46.0)
Hemoglobin: 13.3 g/dL (ref 12.0–15.0)
Lymphocytes Relative: 9 %
Lymphs Abs: 1.9 10*3/uL (ref 0.7–4.0)
MCH: 29.1 pg (ref 26.0–34.0)
MCHC: 33.3 g/dL (ref 30.0–36.0)
MCV: 87.3 fL (ref 78.0–100.0)
Monocytes Absolute: 1.9 10*3/uL — ABNORMAL HIGH (ref 0.1–1.0)
Monocytes Relative: 9 %
Neutro Abs: 17.3 10*3/uL — ABNORMAL HIGH (ref 1.7–7.7)
Neutrophils Relative %: 82 %
Platelets: 266 10*3/uL (ref 150–400)
RBC: 4.57 MIL/uL (ref 3.87–5.11)
RDW: 13.3 % (ref 11.5–15.5)
WBC: 21.1 10*3/uL — ABNORMAL HIGH (ref 4.0–10.5)

## 2015-11-20 LAB — RAPID STREP SCREEN (MED CTR MEBANE ONLY): Streptococcus, Group A Screen (Direct): NEGATIVE

## 2015-11-20 LAB — I-STAT CG4 LACTIC ACID, ED
Lactic Acid, Venous: 1.22 mmol/L (ref 0.5–2.0)
Lactic Acid, Venous: 0.47 mmol/L — ABNORMAL LOW (ref 0.5–2.0)

## 2015-11-20 MED ORDER — ACETAMINOPHEN 325 MG PO TABS
650.0000 mg | ORAL_TABLET | Freq: Once | ORAL | Status: AC | PRN
  Administered 2015-11-20: 650 mg via ORAL
  Filled 2015-11-20: qty 2

## 2015-11-20 MED ORDER — SODIUM CHLORIDE 0.9 % IV BOLUS (SEPSIS)
1000.0000 mL | Freq: Once | INTRAVENOUS | Status: AC
  Administered 2015-11-20: 1000 mL via INTRAVENOUS

## 2015-11-20 MED ORDER — KETOROLAC TROMETHAMINE 30 MG/ML IJ SOLN
30.0000 mg | Freq: Once | INTRAMUSCULAR | Status: AC
  Administered 2015-11-20: 30 mg via INTRAVENOUS
  Filled 2015-11-20: qty 1

## 2015-11-20 MED ORDER — OSELTAMIVIR PHOSPHATE 75 MG PO CAPS: 75.0000 mg | ORAL_CAPSULE | Freq: Two times a day (BID) | ORAL | Status: DC

## 2015-11-20 NOTE — ED Provider Notes (Signed)
CSN: [id number]     Arrival date & time 11/20/15  1638 History   First MD Initiated Contact with Patient 11/20/15 1722     Chief Complaint  Patient presents with  . Fever  . Tachycardia     (Consider location/radiation/quality/duration/timing/severity/associated sxs/prior Treatment) HPI 25 year old female who presents with fever and chills. States one day of subjective fevers, body aches, myalgias, sore throat, cough, and chills. No known sick contacts. No chest pain or shortness of breath. No abdominal pain, nausea or vomiting or diarrhea. No abnormal vaginal discharge or bleeding. No dysuria, urinary frequency, or flank pain.   Past Medical History  Diagnosis Date  . Medical history non-contributory    Past Surgical History  Procedure Laterality Date  . No past surgeries    . Foreign body removal Left 09/18/2015    Procedure: FOREIGN BODY REMOVAL OF BIRTH CONTROL DEVICE IN LEFT ARM ADULT;  Surgeon: Luretha Murphy, MD;  Location: [facility];  Service: General;  Laterality: Left;   Family History  Problem Relation Age of Onset  . Adopted: Yes  . Other Neg Hx    Social History  Substance Use Topics  . Smoking status: Never Smoker   . Smokeless tobacco: Never Used  . Alcohol Use: No   OB History    Gravida Para Term Preterm AB TAB SAB Ectopic Multiple Living   Review of Systems 10/14 systems reviewed and are negative other than those stated in the HPI   Allergies  Review of patient's allergies indicates no known allergies.  Home Medications   Prior to Admission medications   Medication Sig Start Date End Date Taking? Authorizing Provider  acetaminophen (TYLENOL) 500 MG tablet Take 1,000 mg by mouth every 6 (six) hours as needed for moderate pain or headache. Reported on 11/20/2015   Yes Historical Provider, MD  Doxylamine-Phenylephrine-APAP (VICKS SINEX DAYQUIL/NYQUIL PO) Take 2 capsules by mouth 2 (two) times daily as needed (cold  symptoms).   Yes Historical Provider, MD  HYDROcodone-acetaminophen (NORCO) 5-325 MG tablet Take 1 tablet by mouth every 4 (four) hours as needed for moderate pain. Patient not taking: Reported on 09/24/2015 09/18/15   Luretha Murphy, MD  oseltamivir (TAMIFLU) 75 MG capsule Take 1 capsule (75 mg total) by mouth every 12 (twelve) hours. 11/20/15   Lavera Guise, MD   BP 102/64 mmHg  Pulse 106  Temp(Src) 99.2 F (37.3 C) (Oral)  Resp 19  SpO2 100%  LMP 10/30/2015 Physical Exam Physical Exam  Nursing note and vitals reviewed. Constitutional: Well developed, well nourished, non-toxic, and in no acute distress Head: Normocephalic and atraumatic.  Mouth/Throat: Oropharynx is erythematous and mucous membranes dry.   Neck: Normal range of motion. Neck supple.  Cardiovascular: Tachycardic rate and regular rhythm.  no edema. No murmur Pulmonary/Chest: Effort normal and breath sounds normal.  Abdominal: Soft. There is no tenderness. There is no rebound and no guarding.  Musculoskeletal: Normal range of motion.  Neurological: Alert, no facial droop, fluent speech, moves all extremities symmetrically Skin: Skin is warm and dry.  Psychiatric: Cooperative  ED Course  Procedures (including critical care time) Labs Review Labs Reviewed  COMPREHENSIVE METABOLIC PANEL - Abnormal; Notable for the following:    Sodium 134 (*)    Chloride 100 (*)    Glucose, Bld 108 (*)    Total Protein 8.3 (*)    All other components within normal limits  CBC  WITH DIFFERENTIAL/PLATELET - Abnormal; Notable for the following:    WBC 21.1 (*)    Neutro Abs 17.3 (*)    Monocytes Absolute 1.9 (*)    All other components within normal limits  URINALYSIS, ROUTINE W REFLEX MICROSCOPIC (NOT AT [facility]) - Abnormal; Notable for the following:    APPearance HAZY (*)    All other components within normal limits  I-STAT CG4 LACTIC ACID, ED - Abnormal; Notable for the following:    Lactic Acid, Venous 0.47 (*)    All other  components within normal limits  RAPID STREP SCREEN (NOT AT [facility])  CULTURE, BLOOD (ROUTINE X 2)  CULTURE, BLOOD (ROUTINE X 2)  URINE CULTURE  CULTURE, GROUP A STREP ([facility])  I-STAT BETA HCG BLOOD, ED ([facility], [facility], [facility] ONLY)  I-STAT CG4 LACTIC ACID, ED    Imaging Review Dg Chest 2 View  11/20/2015  CLINICAL DATA:  Fever, sore throat starting yesterday, generalized body aches EXAM: CHEST  2 VIEW COMPARISON:  12/01/2013 FINDINGS: Cardiomediastinal silhouette is stable. No acute infiltrate or pleural effusion. No pulmonary edema. Hyperinflation again noted. Bilateral nodular nipple shadow again noted. IMPRESSION: No active cardiopulmonary disease. Mild hyperinflation. Electronically Signed   By: Natasha MeadLiviu  Pop M.D.   On: 11/20/2015 17:28   I have personally reviewed and evaluated these images and lab results as part of my medical decision-making.   EKG Interpretation None      MDM   Final diagnoses:  Influenza-like illness    25 year old female who presents with one day of fevers, myalgias, sore throat and cough. Appears unwell but is nontoxic and in no acute distress. Was febrile to 103 and tachycardic with a heart rate in the 130s on arrival. No respiratory distress, and normotensive. He appears dry on exam, the remainder of exam is overall unremarkable and presentation consistent with likely flulike illness. Her chest x-ray shows no evidence of pneumonia or other acute cardiopulmonary processes. Urine is negative for infection. Strep negative. She does have leukocytosis, but I think this is likely reactive from her viral illness, and not due to bacterial source at this time. Received IV fluids, Tylenol and toradol, and feels somewhat improved. Her tachycardia improves on my evaluation heart rate in the 90s. Within time period to receive tamiflu and discussed risks benefits. Requested take this medication, which is prescribed. Strict return and follow-up instructions are reviewed. She expressed  understanding of all discharge instructions, and felt comfortable with the plan of care.    Lavera Guiseana Duo Chontel Warning, MD 11/21/15 [phone]

## 2015-11-20 NOTE — ED Notes (Signed)
Notified ED MD of possible sepsis patient, they stated it was okay to place sepsis protocol

## 2015-11-20 NOTE — ED Notes (Signed)
Pt c/o fever, generalized body aches, sore throat since yesterday. Denies cough, N/V/D. Fever 103.1 in triage with a HR at 130, visible cold chills.

## 2015-11-20 NOTE — ED Notes (Signed)
Bed: WA14 Expected date:  Expected time:  Means of arrival:  Comments: TR 1 

## 2015-11-20 NOTE — Discharge Instructions (Signed)
Please return without fail for worsening symptoms, including confusion, vomiting unable to keep down food or fluids, difficulty breathing, passing out, or any other symptoms concerning to you.  Influenza, Adult Influenza ("the flu") is a viral infection of the respiratory tract. It occurs more often in winter months because people spend more time in close contact with one another. Influenza can make you feel very sick. Influenza easily spreads from person to person (contagious). CAUSES  Influenza is caused by a virus that infects the respiratory tract. You can catch the virus by breathing in droplets from an infected person's cough or sneeze. You can also catch the virus by touching something that was recently contaminated with the virus and then touching your mouth, nose, or eyes. RISKS AND COMPLICATIONS You may be at risk for a more severe case of influenza if you smoke cigarettes, have diabetes, have chronic heart disease (such as heart failure) or lung disease (such as asthma), or if you have a weakened immune system. Elderly people and pregnant women are also at risk for more serious infections. The most common problem of influenza is a lung infection (pneumonia). Sometimes, this problem can require emergency medical care and may be life threatening. SIGNS AND SYMPTOMS  Symptoms typically last 4 to 10 days and may include:  Fever.  Chills.  Headache, body aches, and muscle aches.  Sore throat.  Chest discomfort and cough.  Poor appetite.  Weakness or feeling tired.  Dizziness.  Nausea or vomiting. DIAGNOSIS  Diagnosis of influenza is often made based on your history and a physical exam. A nose or throat swab test can be done to confirm the diagnosis. TREATMENT  In mild cases, influenza goes away on its own. Treatment is directed at relieving symptoms. For more severe cases, your health care provider may prescribe antiviral medicines to shorten the sickness. Antibiotic medicines  are not effective because the infection is caused by a virus, not by bacteria. HOME CARE INSTRUCTIONS  Take medicines only as directed by your health care provider.  Use a cool mist humidifier to make breathing easier.  Get plenty of rest until your temperature returns to normal. This usually takes 3 to 4 days.  Drink enough fluid to keep your urine clear or pale yellow.  Cover yourmouth and nosewhen coughing or sneezing,and wash your handswellto prevent thevirusfrom spreading.  Stay homefromwork orschool untilthe fever is gonefor at least 781full day. PREVENTION  An annual influenza vaccination (flu shot) is the best way to avoid getting influenza. An annual flu shot is now routinely recommended for all adults in the [place] SEEK MEDICAL CARE IF:  You experiencechest pain, yourcough worsens,or you producemore mucus.  Youhave nausea,vomiting, ordiarrhea.  Your fever returns or gets worse. SEEK IMMEDIATE MEDICAL CARE IF:  You havetrouble breathing, you become short of breath,or your skin ornails becomebluish.  You have severe painor stiffnessin the neck.  You develop a sudden headache, or pain in the face or ear.  You have nausea or vomiting that you cannot control. MAKE SURE YOU:   Understand these instructions.  Will watch your condition.  Will get help right away if you are not doing well or get worse.   This information is not intended to replace advice given to you by your health care provider. Make sure you discuss any questions you have with your health care provider.   Document Released: 08/02/2000 Document Revised: 08/26/2014 Document Reviewed: 11/04/2011 Elsevier Interactive Patient Education [employer]2016 [employer] Inc[employer]

## 2015-11-21 LAB — URINE CULTURE

## 2015-11-22 ENCOUNTER — Encounter (HOSPITAL_COMMUNITY): Payer: Self-pay

## 2015-11-22 ENCOUNTER — Emergency Department (HOSPITAL_COMMUNITY)
Admission: EM | Admit: 2015-11-22 | Discharge: 2015-11-22 | Disposition: A | Discharge status: Home / Self Care | Payer: Managed Care, Other (non HMO) | Attending: Emergency Medicine | Admitting: Emergency Medicine

## 2015-11-22 DIAGNOSIS — M542 Cervicalgia: Secondary | ICD-10-CM | POA: Insufficient documentation

## 2015-11-22 DIAGNOSIS — R05 Cough: Secondary | ICD-10-CM | POA: Insufficient documentation

## 2015-11-22 DIAGNOSIS — R51 Headache: Secondary | ICD-10-CM | POA: Insufficient documentation

## 2015-11-22 LAB — CULTURE, GROUP A STREP (THRC)

## 2015-11-22 NOTE — ED Notes (Signed)
Pt not in lobby.  

## 2015-11-22 NOTE — ED Notes (Signed)
Not found in lobby.  2 calls

## 2015-11-22 NOTE — ED Notes (Signed)
Pt here 2 days ago for same.  Body aches with cough.  Main complaint today is headache with neck pain.  Pt is drinking but not eating well.  No n/v

## 2015-11-25 LAB — CULTURE, BLOOD (ROUTINE X 2)
Culture: NO GROWTH
Culture: NO GROWTH

## 2016-01-30 ENCOUNTER — Emergency Department (HOSPITAL_COMMUNITY): Payer: Medicaid Other

## 2016-01-30 ENCOUNTER — Emergency Department (HOSPITAL_COMMUNITY)
Admission: EM | Admit: 2016-01-30 | Discharge: 2016-01-30 | Disposition: A | Discharge status: Home / Self Care | Payer: Medicaid Other | Attending: Emergency Medicine | Admitting: Emergency Medicine

## 2016-01-30 ENCOUNTER — Encounter (HOSPITAL_COMMUNITY): Payer: Self-pay | Admitting: *Deleted

## 2016-01-30 DIAGNOSIS — R1030 Lower abdominal pain, unspecified: Secondary | ICD-10-CM | POA: Diagnosis present

## 2016-01-30 DIAGNOSIS — Z3A01 Less than 8 weeks gestation of pregnancy: Secondary | ICD-10-CM | POA: Insufficient documentation

## 2016-01-30 DIAGNOSIS — N760 Acute vaginitis: Secondary | ICD-10-CM

## 2016-01-30 DIAGNOSIS — O23593 Infection of other part of genital tract in pregnancy, third trimester: Secondary | ICD-10-CM | POA: Diagnosis not present

## 2016-01-30 DIAGNOSIS — Z3490 Encounter for supervision of normal pregnancy, unspecified, unspecified trimester: Secondary | ICD-10-CM

## 2016-01-30 DIAGNOSIS — Z79899 Other long term (current) drug therapy: Secondary | ICD-10-CM | POA: Diagnosis not present

## 2016-01-30 DIAGNOSIS — R52 Pain, unspecified: Secondary | ICD-10-CM

## 2016-01-30 LAB — HCG, QUANTITATIVE, PREGNANCY: hCG, Beta Chain, Quant, S: 2760 m[IU]/mL — ABNORMAL HIGH (ref <5)

## 2016-01-30 LAB — WET PREP, GENITAL
Sperm: NONE SEEN
Trich, Wet Prep: NONE SEEN
Yeast Wet Prep HPF POC: NONE SEEN

## 2016-01-30 LAB — CBC WITH DIFFERENTIAL/PLATELET
Basophils Absolute: 0 10*3/uL (ref 0.0–0.1)
Basophils Relative: 1 %
Eosinophils Absolute: 0.1 10*3/uL (ref 0.0–0.7)
Eosinophils Relative: 2 %
HCT: 33.7 % — ABNORMAL LOW (ref 36.0–46.0)
Hemoglobin: 11.1 g/dL — ABNORMAL LOW (ref 12.0–15.0)
Lymphocytes Relative: 25 %
Lymphs Abs: 1.7 10*3/uL (ref 0.7–4.0)
MCH: 29 pg (ref 26.0–34.0)
MCHC: 32.9 g/dL (ref 30.0–36.0)
MCV: 88 fL (ref 78.0–100.0)
Monocytes Absolute: 1 10*3/uL (ref 0.1–1.0)
Monocytes Relative: 14 %
Neutro Abs: 4 10*3/uL (ref 1.7–7.7)
Neutrophils Relative %: 58 %
Platelets: 307 10*3/uL (ref 150–400)
RBC: 3.83 MIL/uL — ABNORMAL LOW (ref 3.87–5.11)
RDW: 13.1 % (ref 11.5–15.5)
WBC: 6.8 10*3/uL (ref 4.0–10.5)

## 2016-01-30 LAB — BASIC METABOLIC PANEL
Anion gap: 7 (ref 5–15)
BUN: 10 mg/dL (ref 6–20)
CO2: 24 mmol/L (ref 22–32)
Calcium: 8.8 mg/dL — ABNORMAL LOW (ref 8.9–10.3)
Chloride: 106 mmol/L (ref 101–111)
Creatinine, Ser: 0.62 mg/dL (ref 0.44–1.00)
GFR calc Af Amer: >60 mL/min (ref >60)
GFR calc non Af Amer: >60 mL/min (ref >60)
Glucose, Bld: 80 mg/dL (ref 65–99)
Potassium: 3.5 mmol/L (ref 3.5–5.1)
Sodium: 137 mmol/L (ref 135–145)

## 2016-01-30 MED ORDER — METRONIDAZOLE 500 MG PO TABS: 500.0000 mg | ORAL_TABLET | Freq: Two times a day (BID) | ORAL | Status: DC

## 2016-01-30 NOTE — Discharge Instructions (Signed)
First Trimester of Pregnancy  The first trimester of pregnancy is from week 1 until the end of week 12 (months 1 through 3). A week after a sperm fertilizes an egg, the egg will implant on the wall of the uterus. This embryo will begin to develop into a baby. Genes from you and your partner are forming the baby. The female genes determine whether the baby is a boy or a girl. At 6-8 weeks, the eyes and face are formed, and the heartbeat can be seen on ultrasound. At the end of 12 weeks, all the baby's organs are formed.   Now that you are pregnant, you will want to do everything you can to have a healthy baby. Two of the most important things are to get good prenatal care and to follow your health care provider's instructions. Prenatal care is all the medical care you receive before the baby's birth. This care will help prevent, find, and treat any problems during the pregnancy and childbirth.  BODY CHANGES  Your body goes through many changes during pregnancy. The changes vary from woman to woman.   · You may gain or lose a couple of pounds at first.  · You may feel sick to your stomach (nauseous) and throw up (vomit). If the vomiting is uncontrollable, call your health care provider.  · You may tire easily.  · You may develop headaches that can be relieved by medicines approved by your health care provider.  · You may urinate more often. Painful urination may mean you have a bladder infection.  · You may develop heartburn as a result of your pregnancy.  · You may develop constipation because certain hormones are causing the muscles that push waste through your intestines to slow down.  · You may develop hemorrhoids or swollen, bulging veins (varicose veins).  · Your breasts may begin to grow larger and become tender. Your nipples may stick out more, and the tissue that surrounds them (areola) may become darker.  · Your gums may bleed and may be sensitive to brushing and flossing.   · Dark spots or blotches (chloasma, mask of pregnancy) may develop on your face. This will likely fade after the baby is born.  · Your menstrual periods will stop.  · You may have a loss of appetite.  · You may develop cravings for certain kinds of food.  · You may have changes in your emotions from day to day, such as being excited to be pregnant or being concerned that something may go wrong with the pregnancy and baby.  · You may have more vivid and strange dreams.  · You may have changes in your hair. These can include thickening of your hair, rapid growth, and changes in texture. Some women also have hair loss during or after pregnancy, or hair that feels dry or thin. Your hair will most likely return to normal after your baby is born.  WHAT TO EXPECT AT YOUR PRENATAL VISITS  During a routine prenatal visit:  · You will be weighed to make sure you and the baby are growing normally.  · Your blood pressure will be taken.  · Your abdomen will be measured to track your baby's growth.  · The fetal heartbeat will be listened to starting around week 10 or 12 of your pregnancy.  · Test results from any previous visits will be discussed.  Your health care provider may ask you:  · How you are feeling.  · If you   including cigarettes, chewing tobacco, and electronic cigarettes. °· If you have any questions. °Other tests that may be performed during your first trimester include: °· Blood tests to find your blood type and to check for the presence of any previous infections. They will also be used to check for low iron levels (anemia) and Rh antibodies. Later in the pregnancy, blood tests for diabetes will be done along with other tests if problems develop. °· Urine tests to check for infections, diabetes, or protein in the urine. °· An ultrasound to confirm the proper growth  and development of the baby. °· An amniocentesis to check for possible genetic problems. °· Fetal screens for spina bifida and Down syndrome. °· You may need other tests to make sure you and the baby are doing well. °· HIV (human immunodeficiency virus) testing. Routine prenatal testing includes screening for HIV, unless you choose not to have this test. °HOME CARE INSTRUCTIONS  °Medicines °· Follow your health care provider's instructions regarding medicine use. Specific medicines may be either safe or unsafe to take during pregnancy. °· Take your prenatal vitamins as directed. °· If you develop constipation, try taking a stool softener if your health care provider approves. °Diet °· Eat regular, well-balanced meals. Choose a variety of foods, such as meat or vegetable-based protein, fish, milk and low-fat dairy products, vegetables, fruits, and whole grain breads and cereals. Your health care provider will help you determine the amount of weight gain that is right for you. °· Avoid raw meat and uncooked cheese. These carry germs that can cause birth defects in the baby. °· Eating four or five small meals rather than three large meals a day may help relieve nausea and vomiting. If you start to feel nauseous, eating a few soda crackers can be helpful. Drinking liquids between meals instead of during meals also seems to help nausea and vomiting. °· If you develop constipation, eat more high-fiber foods, such as fresh vegetables or fruit and whole grains. Drink enough fluids to keep your urine clear or pale yellow. °Activity and Exercise °· Exercise only as directed by your health care provider. Exercising will help you: °· Control your weight. °· Stay in shape. °· Be prepared for labor and delivery. °· Experiencing pain or cramping in the lower abdomen or low back is a good sign that you should stop exercising. Check with your health care provider before continuing normal exercises. °· Try to avoid standing for long  periods of time. Move your legs often if you must stand in one place for a long time. °· Avoid heavy lifting. °· Wear low-heeled shoes, and practice good posture. °· You may continue to have sex unless your health care provider directs you otherwise. °Relief of Pain or Discomfort °· Wear a good support bra for breast tenderness.   °· Take warm sitz baths to soothe any pain or discomfort caused by hemorrhoids. Use hemorrhoid cream if your health care provider approves.   °· Rest with your legs elevated if you have leg cramps or low back pain. °· If you develop varicose veins in your legs, wear support hose. Elevate your feet for 15 minutes, 3-4 times a day. Limit salt in your diet. °Prenatal Care °· Schedule your prenatal visits by the twelfth week of pregnancy. They are usually scheduled monthly at first, then more often in the last 2 months before delivery. °· Write down your questions. Take them to your prenatal visits. °· Keep all your prenatal visits as directed by your   health care provider. Safety  Wear your seat belt at all times when driving.  Make a list of emergency phone numbers, including numbers for family, friends, the hospital, and police and fire departments. General Tips  Ask your health care provider for a referral to a local prenatal education class. Begin classes no later than at the beginning of month 6 of your pregnancy.  Ask for help if you have counseling or nutritional needs during pregnancy. Your health care provider can offer advice or refer you to specialists for help with various needs.  Do not use hot tubs, steam rooms, or saunas.  Do not douche or use tampons or scented sanitary pads.  Do not cross your legs for long periods of time.  Avoid cat litter boxes and soil used by cats. These carry germs that can cause birth defects in the baby and possibly loss of the fetus by miscarriage or stillbirth.  Avoid all smoking, herbs, alcohol, and medicines not prescribed by  your health care provider. Chemicals in these affect the formation and growth of the baby.  Do not use any tobacco products, including cigarettes, chewing tobacco, and electronic cigarettes. If you need help quitting, ask your health care provider. You may receive counseling support and other resources to help you quit.  Schedule a dentist appointment. At home, brush your teeth with a soft toothbrush and be gentle when you floss. SEEK MEDICAL CARE IF:   You have dizziness.  You have mild pelvic cramps, pelvic pressure, or nagging pain in the abdominal area.  You have persistent nausea, vomiting, or diarrhea.  You have a bad smelling vaginal discharge.  You have pain with urination.  You notice increased swelling in your face, hands, legs, or ankles. SEEK IMMEDIATE MEDICAL CARE IF:   You have a fever.  You are leaking fluid from your vagina.  You have spotting or bleeding from your vagina.  You have severe abdominal cramping or pain.  You have rapid weight gain or loss.  You vomit blood or material that looks like coffee grounds.  You are exposed to [place] and have never had them.  You are exposed to fifth disease or chickenpox.  You develop a severe headache.  You have shortness of breath.  You have any kind of trauma, such as from a fall or a car accident.   This information is not intended to replace advice given to you by your health care provider. Make sure you discuss any questions you have with your health care provider.   Document Released: 07/30/2001 Document Revised: 08/26/2014 Document Reviewed: 06/15/2013 Elsevier Interactive Patient Education 2016 [employer]  Vaginitis Vaginitis is an inflammation of the vagina. It is most often caused by a change in the normal balance of the bacteria and yeast that live in the vagina. This change in balance causes an overgrowth of certain bacteria or yeast, which causes the inflammation. There are different  types of vaginitis, but the most common types are:  Bacterial vaginosis.  Yeast infection (candidiasis).  Trichomoniasis vaginitis. This is a sexually transmitted infection (STI).  Viral vaginitis.  Atrophic vaginitis.  Allergic vaginitis. CAUSES  The cause depends on the type of vaginitis. Vaginitis can be caused by:  Bacteria (bacterial vaginosis).  Yeast (yeast infection).  A parasite (trichomoniasis vaginitis)  A virus (viral vaginitis).  Low hormone levels (atrophic vaginitis). Low hormone levels can occur during pregnancy, breastfeeding, or after menopause.  Irritants, such as bubble baths, scented tampons, and feminine sprays (allergic  vaginitis). Other factors can change the normal balance of the yeast and bacteria that live in the vagina. These include:  Antibiotic medicines.  Poor hygiene.  Diaphragms, vaginal sponges, spermicides, birth control pills, and intrauterine devices (IUD).  Sexual intercourse.  Infection.  Uncontrolled diabetes.  A weakened immune system. SYMPTOMS  Symptoms can vary depending on the cause of the vaginitis. Common symptoms include:  Abnormal vaginal discharge.  The discharge is white, gray, or yellow with bacterial vaginosis.  The discharge is thick, white, and cheesy with a yeast infection.  The discharge is frothy and yellow or greenish with trichomoniasis.  A bad vaginal odor.  The odor is fishy with bacterial vaginosis.  Vaginal itching, pain, or swelling.  Painful intercourse.  Pain or burning when urinating. Sometimes, there are no symptoms. TREATMENT  Treatment will vary depending on the type of infection.   Bacterial vaginosis and trichomoniasis are often treated with antibiotic creams or pills.  Yeast infections are often treated with antifungal medicines, such as vaginal creams or suppositories.  Viral vaginitis has no cure, but symptoms can be treated with medicines that relieve discomfort. Your  sexual partner should be treated as well.  Atrophic vaginitis may be treated with an estrogen cream, pill, suppository, or vaginal ring. If vaginal dryness occurs, lubricants and moisturizing creams may help. You may be told to avoid scented soaps, sprays, or douches.  Allergic vaginitis treatment involves quitting the use of the product that is causing the problem. Vaginal creams can be used to treat the symptoms. HOME CARE INSTRUCTIONS   Take all medicines as directed by your caregiver.  Keep your genital area clean and dry. Avoid soap and only rinse the area with water.  Avoid douching. It can remove the healthy bacteria in the vagina.  Do not use tampons or have sexual intercourse until your vaginitis has been treated. Use sanitary pads while you have vaginitis.  Wipe from front to back. This avoids the spread of bacteria from the rectum to the vagina.  Let air reach your genital area.  Wear cotton underwear to decrease moisture buildup.  Avoid wearing underwear while you sleep until your vaginitis is gone.  Avoid tight pants and underwear or nylons without a cotton panel.  Take off wet clothing (especially bathing suits) as soon as possible.  Use mild, non-scented products. Avoid using irritants, such as:  Scented feminine sprays.  Fabric softeners.  Scented detergents.  Scented tampons.  Scented soaps or bubble baths.  Practice safe sex and use condoms. Condoms may prevent the spread of trichomoniasis and viral vaginitis. SEEK MEDICAL CARE IF:   You have abdominal pain.  You have a fever or persistent symptoms for more than 2-3 days.  You have a fever and your symptoms suddenly get worse.   This information is not intended to replace advice given to you by your health care provider. Make sure you discuss any questions you have with your health care provider.   Document Released: 06/02/2007 Document Revised: 12/20/2014 Document Reviewed: 01/16/2012 Elsevier  Interactive Patient Education [employer]

## 2016-01-30 NOTE — ED Provider Notes (Signed)
CSN: [id number]     Arrival date & time 01/30/16  1158 History   First MD Initiated Contact with Patient 01/30/16 1238     Chief Complaint  Patient presents with  . pregnant, cramping      (Consider location/radiation/quality/duration/timing/severity/associated sxs/prior Treatment) HPI   Jamiaya Dotter is a 25 y.o. female who presents for evaluation of lower abdominal cramping, without vaginal bleeding for 2 days. Last menstrual period 12/28/2015. She has had a positive home pregnancy test, and similar at [facility]. She denies fever, chills, cough, chest pain, weakness or dizziness. She has had occasional nausea and vomiting in the last 48 hours. She is gravida 3, para 2002. There are no other no modifying factors.    Past Medical History  Diagnosis Date  . Medical history non-contributory    Past Surgical History  Procedure Laterality Date  . No past surgeries    . Foreign body removal Left 09/18/2015    Procedure: FOREIGN BODY REMOVAL OF BIRTH CONTROL DEVICE IN LEFT ARM ADULT;  Surgeon: Luretha Murphy, MD;  Location: [facility];  Service: General;  Laterality: Left;   Family History  Problem Relation Age of Onset  . Adopted: Yes  . Other Neg Hx    Social History  Substance Use Topics  . Smoking status: Never Smoker   . Smokeless tobacco: Never Used  . Alcohol Use: No   OB History    Gravida Para Term Preterm AB TAB SAB Ectopic Multiple Living   3 2 2       2      Review of Systems  All other systems reviewed and are negative.     Allergies  Review of patient's allergies indicates no known allergies.  Home Medications   Prior to Admission medications   Medication Sig Start Date End Date Taking? Authorizing Provider  HYDROcodone-acetaminophen (NORCO) 5-325 MG tablet Take 1 tablet by mouth every 4 (four) hours as needed for moderate pain. Patient not taking: Reported on 09/24/2015 09/18/15   Luretha Murphy, MD  metroNIDAZOLE (FLAGYL) 500 MG  tablet Take 1 tablet (500 mg total) by mouth 2 (two) times daily. One po bid x 7 days 01/30/16   Mancel Bale, MD  oseltamivir (TAMIFLU) 75 MG capsule Take 1 capsule (75 mg total) by mouth every 12 (twelve) hours. Patient not taking: Reported on 01/30/2016 11/20/15   Lavera Guise, MD   BP 121/74 mmHg  Pulse 82  Temp(Src) 98.8 F (37.1 C) (Oral)  Resp 13  SpO2 97%  LMP 12/28/2015 Physical Exam  Constitutional: She is oriented to person, place, and time. She appears well-developed and well-nourished. No distress.  HENT:  Head: Normocephalic and atraumatic.  Right Ear: External ear normal.  Left Ear: External ear normal.  Eyes: Conjunctivae and EOM are normal. Pupils are equal, round, and reactive to light.  Neck: Normal range of motion and phonation normal. Neck supple.  Cardiovascular: Normal rate, regular rhythm and normal heart sounds.   Pulmonary/Chest: Effort normal and breath sounds normal. She exhibits no bony tenderness.  Abdominal: Soft. She exhibits no mass. There is no tenderness (Diffuse, mild). There is no rebound and no guarding.  Genitourinary:  Normal external female genitalia. Small amount of thin white vaginal discharge. Cervical os is closed. No blood in vagina. On bimanual examination the uterus is not enlarged. There is mild left adnexal tenderness without palpable mass in this region. There was no palpable ovary, either side.  Musculoskeletal: Normal range of motion.  Neurological:  She is alert and oriented to person, place, and time. No cranial nerve deficit or sensory deficit. She exhibits normal muscle tone. Coordination normal.  Skin: Skin is warm, dry and intact.  Psychiatric: She has a normal mood and affect. Her behavior is normal. Judgment and thought content normal.  Nursing note and vitals reviewed.   ED Course  Procedures (including critical care time)   Medications - No data to display  Patient Vitals for the past 24 hrs:  BP Temp Temp src Pulse Resp  SpO2  01/30/16 1203 121/74 mmHg 98.8 F (37.1 C) Oral 82 13 97 %    3:36 PM Reevaluation with update and discussion. After initial assessment and treatment, an updated evaluation reveals She remains comfortable and has no further complaints. Findings discussed with the patient and all questions were answered. Hollister Wessler L      Labs Review Labs Reviewed  WET PREP, GENITAL - Abnormal; Notable for the following:    Clue Cells Wet Prep HPF POC PRESENT (*)    WBC, Wet Prep HPF POC FEW (*)    All other components within normal limits  BASIC METABOLIC PANEL - Abnormal; Notable for the following:    Calcium 8.8 (*)    All other components within normal limits  CBC WITH DIFFERENTIAL/PLATELET - Abnormal; Notable for the following:    RBC 3.83 (*)    Hemoglobin 11.1 (*)    HCT 33.7 (*)    All other components within normal limits  HCG, QUANTITATIVE, PREGNANCY - Abnormal; Notable for the following:    hCG, Beta Chain, Quant, S 2760 (*)    All other components within normal limits  RPR  HIV ANTIBODY (ROUTINE TESTING)  GC/CHLAMYDIA PROBE AMP ([place]) NOT AT [facility]    Imaging Review [place] Ob Comp Less 14 Wks  01/30/2016  CLINICAL DATA:  Pain cramping for 1 day, pregnant ; EGA [redacted] weeks 5 days by LMP EXAM: OBSTETRIC <14 WK US AND TRANSVAGINAL OB US TECHNIQUE: Both transabdominal and transvaginal ultrasound examinations were performed for complete evaluation of the gestation as well as the maternal uterus, adnexal regions, and pelvic cul-de-sac. Transvaginal technique was performed to assess early pregnancy. COMPARISON:  None. FINDINGS: Intrauterine gestational sac: Present Yolk sac:  Present Embryo:  Absent Cardiac Activity: N/A Heart Rate: N/A  bpm MSD: 5.2  mm   5 w   2  d Subchorionic hemorrhage:  No subchronic hemorrhage Maternal uterus/adnexae: RIGHT ovary normal size and morphology, 1.9 x 2.2 x 1.3 cm. LEFT ovary 3.3 x 2.1 x 2.1 cm and contains a small hemorrhagic corpus luteal cyst 2.1 x  1.5 x 1.9 cm. No additional adnexal masses or free pelvic fluid identified. IMPRESSION: Tiny gestational sac within the uterus containing a yolk sac but no fetal pole is visualized. Unable to establish viability ; could establish viability by followup ultrasound in 14 days if clinically indicated. Small hemorrhagic corpus luteal cyst LEFT ovary. Electronically Signed   By: Ulyses SouthwardMark  Boles M.D.   On: 01/30/2016 14:21   [place] Ob Transvaginal  01/30/2016  CLINICAL DATA:  Pain cramping for 1 day, pregnant ; EGA [redacted] weeks 5 days by LMP EXAM: OBSTETRIC <14 WK US AND TRANSVAGINAL OB US TECHNIQUE: Both transabdominal and transvaginal ultrasound examinations were performed for complete evaluation of the gestation as well as the maternal uterus, adnexal regions, and pelvic cul-de-sac. Transvaginal technique was performed to assess early pregnancy. COMPARISON:  None. FINDINGS: Intrauterine gestational sac: Present Yolk sac:  Present Embryo:  Absent  Cardiac Activity: N/A Heart Rate: N/A  bpm MSD: 5.2  mm   5 w   2  d Subchorionic hemorrhage:  No subchronic hemorrhage Maternal uterus/adnexae: RIGHT ovary normal size and morphology, 1.9 x 2.2 x 1.3 cm. LEFT ovary 3.3 x 2.1 x 2.1 cm and contains a small hemorrhagic corpus luteal cyst 2.1 x 1.5 x 1.9 cm. No additional adnexal masses or free pelvic fluid identified. IMPRESSION: Tiny gestational sac within the uterus containing a yolk sac but no fetal pole is visualized. Unable to establish viability ; could establish viability by followup ultrasound in 14 days if clinically indicated. Small hemorrhagic corpus luteal cyst LEFT ovary. Electronically Signed   By: Ulyses Southward M.D.   On: 01/30/2016 14:21   I have personally reviewed and evaluated these images and lab results as part of my medical decision-making.   EKG Interpretation None      MDM   Final diagnoses:  Pregnancy  Nonspecific vaginitis    Very early pregnancy, with reassuring ultrasound indicating intra-uterine  pregnancy. Viability cannot be assessed. Incidental vaginal discharge consistent with nonspecific vaginitis. Doubt PID, serious bacterial infection or metabolic instability.  Nursing Notes Reviewed/ Care Coordinated Applicable Imaging Reviewed Interpretation of Laboratory Data incorporated into ED treatment  The patient appears reasonably screened and/or stabilized for discharge and I doubt any other medical condition or other [facility] requiring further screening, evaluation, or treatment in the ED at this time prior to discharge.  Plan: Home Medications- Flagyl; Home Treatments- rest; return here if the recommended treatment, does not improve the symptoms; Recommended follow up- OB asap for further evluation and treatment   Mancel Bale, MD 01/30/16 1538

## 2016-01-30 NOTE — ED Notes (Signed)
Pt reports LMP on 5/11 [facility], unsure of due date. She sts this morning she started having lower abdominal and back pain. Denies n/v, vaginal bleeding.

## 2016-01-30 NOTE — ED Notes (Signed)
Patient transported to Ultrasound 

## 2016-01-31 LAB — HIV ANTIBODY (ROUTINE TESTING W REFLEX): HIV Screen 4th Generation wRfx: NONREACTIVE

## 2016-01-31 LAB — GC/CHLAMYDIA PROBE AMP (~~LOC~~) NOT AT ARMC
Chlamydia: NEGATIVE
Neisseria Gonorrhea: NEGATIVE

## 2016-01-31 LAB — RPR: RPR Ser Ql: NONREACTIVE

## 2016-02-14 ENCOUNTER — Encounter (HOSPITAL_COMMUNITY): Payer: Self-pay | Admitting: *Deleted

## 2016-02-14 ENCOUNTER — Inpatient Hospital Stay (HOSPITAL_COMMUNITY): Payer: Medicaid Other

## 2016-02-14 ENCOUNTER — Inpatient Hospital Stay (HOSPITAL_COMMUNITY)
Admission: AD | Admit: 2016-02-14 | Discharge: 2016-02-14 | Disposition: A | Discharge status: Home / Self Care | Payer: Medicaid Other | Source: Ambulatory Visit | Attending: Obstetrics and Gynecology | Admitting: Obstetrics and Gynecology

## 2016-02-14 DIAGNOSIS — R109 Unspecified abdominal pain: Secondary | ICD-10-CM | POA: Diagnosis present

## 2016-02-14 DIAGNOSIS — Z3A01 Less than 8 weeks gestation of pregnancy: Secondary | ICD-10-CM | POA: Insufficient documentation

## 2016-02-14 DIAGNOSIS — O3680X0 Pregnancy with inconclusive fetal viability, not applicable or unspecified: Secondary | ICD-10-CM

## 2016-02-14 DIAGNOSIS — O26891 Other specified pregnancy related conditions, first trimester: Secondary | ICD-10-CM | POA: Insufficient documentation

## 2016-02-14 DIAGNOSIS — O26899 Other specified pregnancy related conditions, unspecified trimester: Secondary | ICD-10-CM

## 2016-02-14 DIAGNOSIS — R42 Dizziness and giddiness: Secondary | ICD-10-CM | POA: Diagnosis present

## 2016-02-14 LAB — WET PREP, GENITAL
Clue Cells Wet Prep HPF POC: NONE SEEN
Sperm: NONE SEEN
Trich, Wet Prep: NONE SEEN
Yeast Wet Prep HPF POC: NONE SEEN

## 2016-02-14 LAB — URINALYSIS, ROUTINE W REFLEX MICROSCOPIC
Bilirubin Urine: NEGATIVE
Glucose, UA: NEGATIVE mg/dL
Hgb urine dipstick: NEGATIVE
Ketones, ur: NEGATIVE mg/dL
Leukocytes, UA: NEGATIVE
Nitrite: NEGATIVE
Protein, ur: NEGATIVE mg/dL
Specific Gravity, Urine: 1.015 (ref 1.005–1.030)
pH: 6 (ref 5.0–8.0)

## 2016-02-14 LAB — CBC WITH DIFFERENTIAL/PLATELET
Basophils Absolute: 0.1 10*3/uL (ref 0.0–0.1)
Basophils Relative: 1 %
Eosinophils Absolute: 0.4 10*3/uL (ref 0.0–0.7)
Eosinophils Relative: 3 %
HCT: 33.7 % — ABNORMAL LOW (ref 36.0–46.0)
Hemoglobin: 11.2 g/dL — ABNORMAL LOW (ref 12.0–15.0)
Lymphocytes Relative: 22 %
Lymphs Abs: 2.6 10*3/uL (ref 0.7–4.0)
MCH: 28.9 pg (ref 26.0–34.0)
MCHC: 33.2 g/dL (ref 30.0–36.0)
MCV: 87.1 fL (ref 78.0–100.0)
Monocytes Absolute: 0.6 10*3/uL (ref 0.1–1.0)
Monocytes Relative: 5 %
Neutro Abs: 8.3 10*3/uL — ABNORMAL HIGH (ref 1.7–7.7)
Neutrophils Relative %: 69 %
Platelets: 306 10*3/uL (ref 150–400)
RBC: 3.87 MIL/uL (ref 3.87–5.11)
RDW: 13.3 % (ref 11.5–15.5)
WBC: 11.9 10*3/uL — ABNORMAL HIGH (ref 4.0–10.5)

## 2016-02-14 LAB — BASIC METABOLIC PANEL
Anion gap: 7 (ref 5–15)
BUN: 12 mg/dL (ref 6–20)
CO2: 24 mmol/L (ref 22–32)
Calcium: 9 mg/dL (ref 8.9–10.3)
Chloride: 101 mmol/L (ref 101–111)
Creatinine, Ser: 0.64 mg/dL (ref 0.44–1.00)
GFR calc Af Amer: >60 mL/min (ref >60)
GFR calc non Af Amer: >60 mL/min (ref >60)
Glucose, Bld: 81 mg/dL (ref 65–99)
Potassium: 3.9 mmol/L (ref 3.5–5.1)
Sodium: 132 mmol/L — ABNORMAL LOW (ref 135–145)

## 2016-02-14 MED ORDER — LACTATED RINGERS IV SOLN: INTRAVENOUS | Status: DC

## 2016-02-14 NOTE — MAU Provider Note (Signed)
CSN: [id number]     Arrival date & time 02/14/16  [id number] History   None    Chief Complaint  Patient presents with  . Abdominal Pain  . Dizziness     (Consider location/radiation/quality/duration/timing/severity/associated sxs/prior Treatment) Abdominal Pain Associated symptoms include headaches (No change in her regular headache). Pertinent negatives include no dysuria or fever.  Dizziness Associated symptoms include abdominal pain and headaches (No change in her regular headache). Pertinent negatives include no chest pain, chills, congestion or fever.   Nicole Howard is a 25 y.o. [id number] @ 4140w6d gestation who presents to the MAU with lower abdominal cramping. [facility] complaining of lower abdominal cramping x 2 weeks. There, she was found to have BV (tx: flagyl, completed) and a ~[redacted]wk gestational sac, no fetal pole. Today, she reports the same lower abdominal cramping (it's now been going on for 4 weeks), no vaginal discharge or blood. No dysuria. No trama. No headache, changes in vision or abdominal pain otherwise. Patient denies the use of drugs and alcohol. Patient notes that this cramping feels like menstrual cramping and was not experienced during any other pregnancy.  The patient also describes dizziness/lightheadedness that began this morning and occurs when standing from a seated position. Asymptomatic when rolling over.  No syncopal episode reported.   Past Medical History  Diagnosis Date  . Medical history non-contributory    Past Surgical History  Procedure Laterality Date  . No past surgeries    . Foreign body removal Left 09/18/2015    Procedure: FOREIGN BODY REMOVAL OF BIRTH CONTROL DEVICE IN LEFT ARM ADULT;  Surgeon: Luretha MurphyMatthew Martin, MD;  Location: [facility];  Service: General;  Laterality: Left;   Family History  Problem Relation Age of Onset  . Adopted: Yes  . Other Neg Hx    Social History  Substance Use Topics  .  Smoking status: Never Smoker   . Smokeless tobacco: Never Used  . Alcohol Use: No   OB History    Gravida Para Term Preterm AB TAB SAB Ectopic Multiple Living   3 2 2       2      Review of Systems  Constitutional: Negative for fever, chills, activity change and appetite change.  HENT: Negative for congestion and postnasal drip.   Eyes: Negative for visual disturbance.  Respiratory: Negative for shortness of breath.   Cardiovascular: Negative for chest pain and palpitations.  Gastrointestinal: Positive for abdominal pain.  Genitourinary: Positive for pelvic pain. Negative for dysuria.  Allergic/Immunologic: Negative for environmental allergies and food allergies.  Neurological: Positive for dizziness, light-headedness and headaches (No change in her regular headache).      Allergies  Review of patient's allergies indicates no known allergies.  Home Medications   Prior to Admission medications   Medication Sig Start Date End Date Taking? Authorizing Provider  acetaminophen (TYLENOL) 325 MG tablet Take 650 mg by mouth every 6 (six) hours as needed for mild pain or headache.   Yes Historical Provider, MD  HYDROcodone-acetaminophen (NORCO) 5-325 MG tablet Take 1 tablet by mouth every 4 (four) hours as needed for moderate pain. Patient not taking: Reported on 09/24/2015 09/18/15   Luretha MurphyMatthew Martin, MD  metroNIDAZOLE (FLAGYL) 500 MG tablet Take 1 tablet (500 mg total) by mouth 2 (two) times daily. One po bid x 7 days Patient not taking: Reported on 02/14/2016 01/30/16   Mancel BaleElliott Wentz, MD  oseltamivir (TAMIFLU) 75 MG capsule Take 1 capsule (75  mg total) by mouth every 12 (twelve) hours. Patient not taking: Reported on 01/30/2016 11/20/15   Lavera Guise, MD   BP 123/77 mmHg  Pulse 76  Temp(Src) 98.2 F (36.8 C)  Resp 18  Wt 104 lb (47.174 kg)  LMP 12/28/2015 Physical Exam  Constitutional: She is oriented to person, place, and time. She appears well-developed and well-nourished.  Eyes:  Conjunctivae and EOM are normal. Pupils are equal, round, and reactive to light.  Mild exophthalmos   Cardiovascular: Normal rate, regular rhythm and normal heart sounds.   Pulmonary/Chest: Effort normal and breath sounds normal. No respiratory distress.  Abdominal: Soft. Bowel sounds are normal. She exhibits no distension. There is tenderness (lower abdominal tenderness).  Genitourinary: Vagina normal.    Pelvic exam was performed with patient supine. No labial fusion. There is no rash, tenderness, lesion or injury on the right labia. There is no rash, tenderness, lesion or injury on the left labia. Uterus is deviated (slightly left). Uterus is not enlarged, not fixed and not tender. Cervix exhibits discharge. Cervix exhibits no motion tenderness and no friability. No bleeding in the vagina. No vaginal discharge found.  Musculoskeletal: Normal range of motion.  Neurological: She is alert and oriented to person, place, and time.  Skin: Skin is warm and dry.  Psychiatric: She has a normal mood and affect. Her behavior is normal. Judgment and thought content normal.    ED Course  Procedures (including critical care time) Labs Review Labs Reviewed  WET PREP, GENITAL - Abnormal; Notable for the following:    WBC, Wet Prep HPF POC FEW (*)    All other components within normal limits  CBC WITH DIFFERENTIAL/PLATELET - Abnormal; Notable for the following:    WBC 11.9 (*)    Hemoglobin 11.2 (*)    HCT 33.7 (*)    Neutro Abs 8.3 (*)    All other components within normal limits  BASIC METABOLIC PANEL - Abnormal; Notable for the following:    Sodium 132 (*)    All other components within normal limits  URINALYSIS, ROUTINE W REFLEX MICROSCOPIC (NOT AT [facility])  GC/CHLAMYDIA PROBE AMP ([place]) NOT AT [facility]    Imaging Review US Ob Transvaginal  02/14/2016  CLINICAL DATA:  First trimester pregnancy with pelvic pain. Evaluate viability. EXAM: TRANSVAGINAL OB ULTRASOUND TECHNIQUE: Transvaginal  ultrasound was performed for complete evaluation of the gestation as well as the maternal uterus, adnexal regions, and pelvic cul-de-sac. COMPARISON:  Prior study 01/30/2016. FINDINGS: Intrauterine gestational sac: Visualized/normal in shape. Yolk sac:  Visualized. Embryo:  Visualized. Cardiac Activity: Visualized. Heart Rate: 147  bpm CRL:  11.3  mm; 7 w 2 d;                  [place] EDC: 09/30/2016 Subchorionic hemorrhage: There is a small subchorionic hematoma. Maternal uterus/adnexae: Both maternal ovaries are visualized. Corpus luteum noted on the left. No adnexal mass or significant free pelvic fluid. IMPRESSION: 1. Single live intrauterine pregnancy with best estimated gestational age of [redacted] weeks 2 days. 2. Small subchorionic hematoma. Electronically Signed   By: Carey Bullocks M.D.   On: 02/14/2016 12:18   I have personally reviewed and evaluated these images and lab results as part of my medical decision-making.   MDM  CBC EKG Orthostatic VS Vaginal [place] Wet prep, GC/chlamydia  25 y.o. female @ [redacted]w[redacted]d with lower abdominal cramping stable for d/c with viable IUP @ 7 weeks 2 days, no bleeding, fever, n/v and does not  appear ill. Discussed with the patient clinical, lab and ultrasound findings and plan of care and all questioned fully answered. She will start her prenatal care and prenatal vitamins and return here if any problems arise.   Final diagnoses:  Abdominal cramping affecting pregnancy

## 2016-02-14 NOTE — Progress Notes (Signed)
[facility] and discussed results with her.  Patient left before I could finish her discharge or have her e-sign.

## 2016-02-14 NOTE — MAU Note (Signed)
Pt presents to MAU with complaints of lower abdominal pain and dizziness. Pt states [facility] for her [facility]. Denies any vaginal bleeding or abnormal discharge

## 2016-02-15 LAB — GC/CHLAMYDIA PROBE AMP (~~LOC~~) NOT AT ARMC
Chlamydia: NEGATIVE
Neisseria Gonorrhea: NEGATIVE

## 2016-03-10 IMAGING — US US ART/VEN ABD/PELV/SCROTUM DOPPLER LTD
1 series · 14 of 25 positions shown · non-contrast
Comparison: None.

CLINICAL DATA: Right lower quadrant pain for 3 days.

EXAM:
TRANSABDOMINAL AND TRANSVAGINAL ULTRASOUND OF PELVIS
DOPPLER ULTRASOUND OF OVARIES
TECHNIQUE: Both transabdominal and transvaginal ultrasound examinations of the
pelvis were performed. Transabdominal technique was performed for
global imaging of the pelvis including uterus, ovaries, adnexal
regions, and pelvic cul-de-sac.
It was necessary to proceed with endovaginal exam following the
transabdominal exam to visualize the right over. Color and duplex
Doppler ultrasound was utilized to evaluate blood flow to the
ovaries.

[Series 1: us art/ven abd/pelv/scrotum doppler ltd · 0.20mm/px · 14 of 59 slices shown]
[im 1/59]
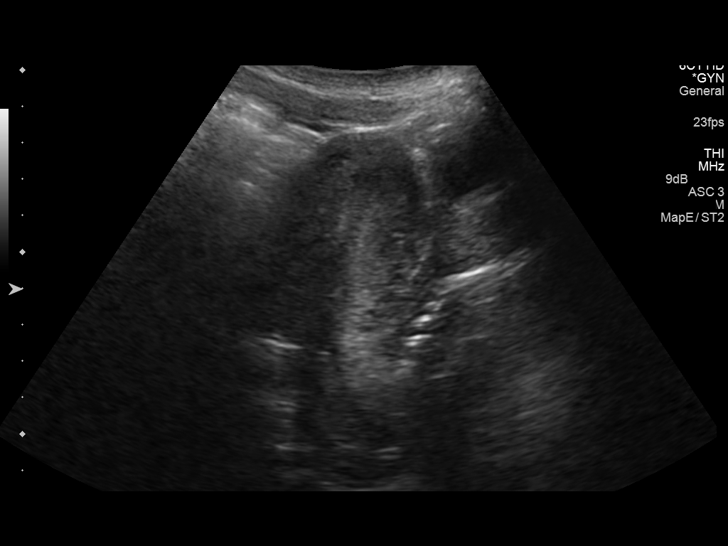
[im 5/59]
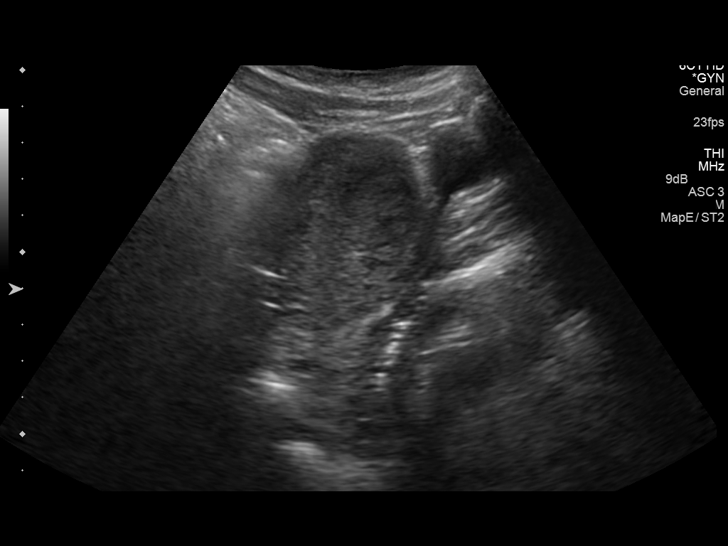
[im 10/59]
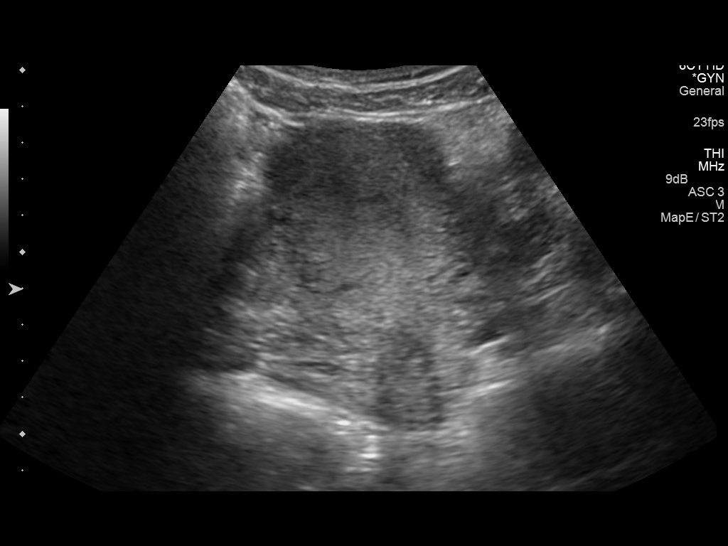
[im 15/59]
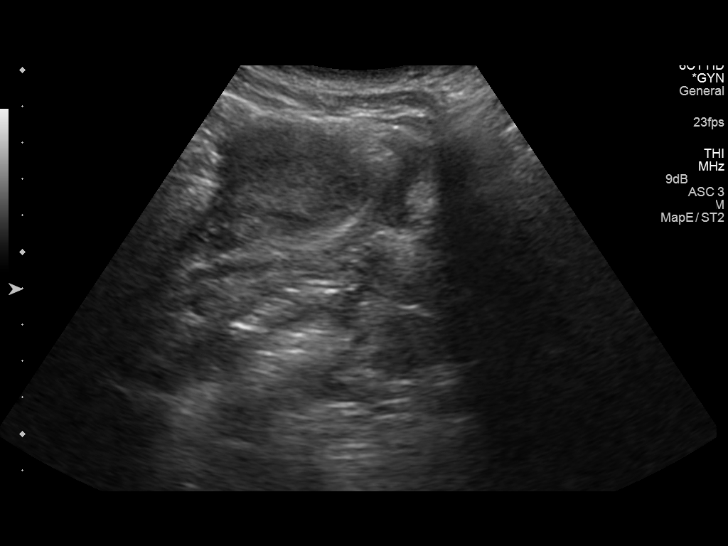
[im 20/59]
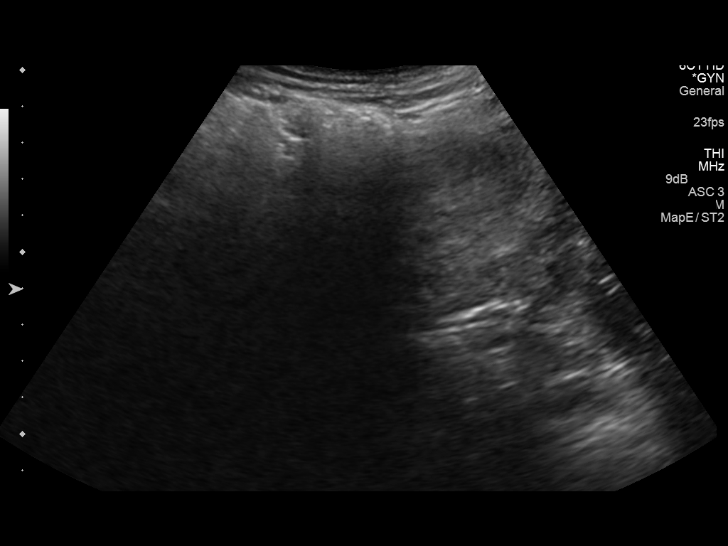
[im 22/59]
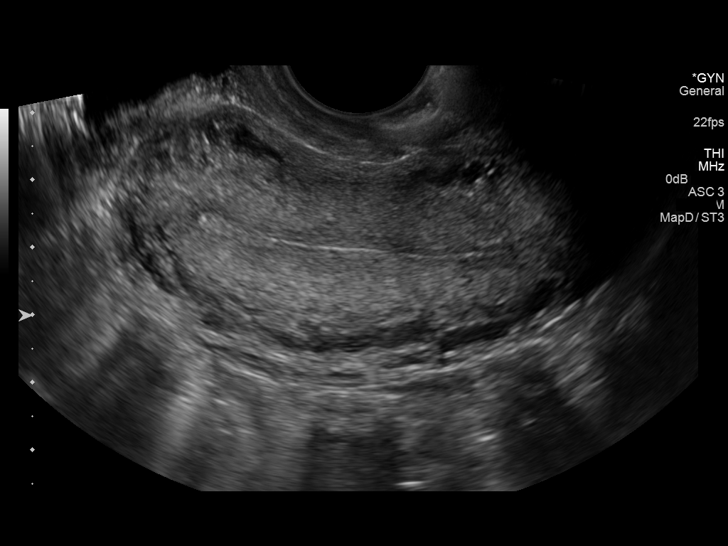
[im 27/59]
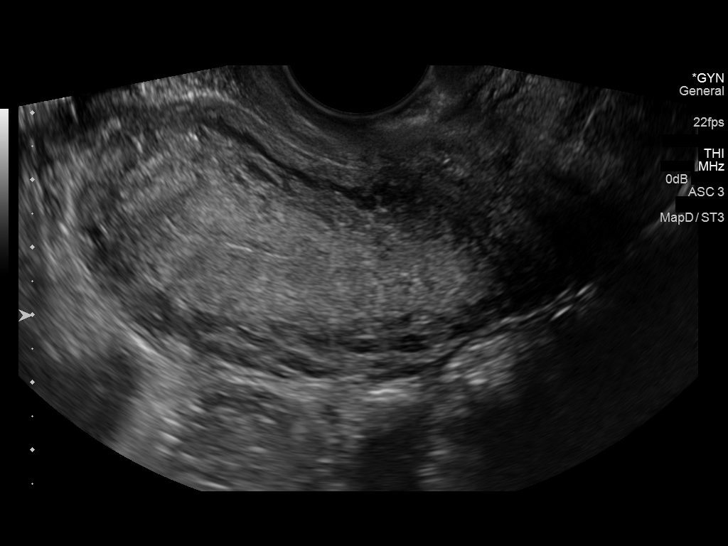
[im 32/59]
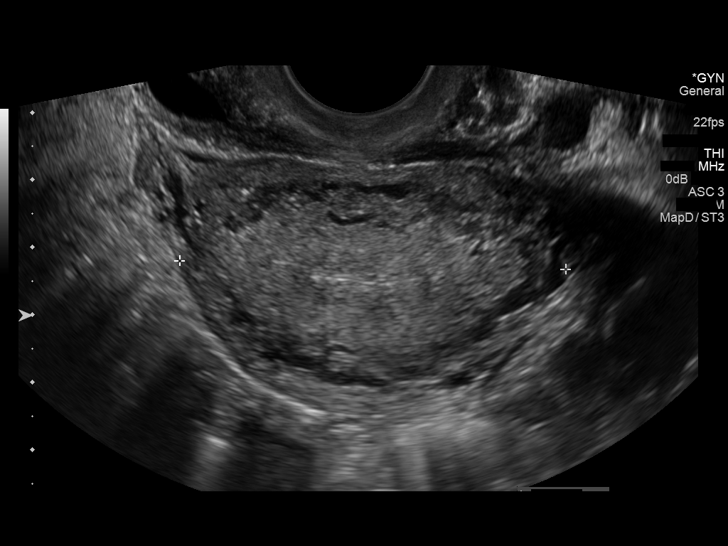
[im 37/59]
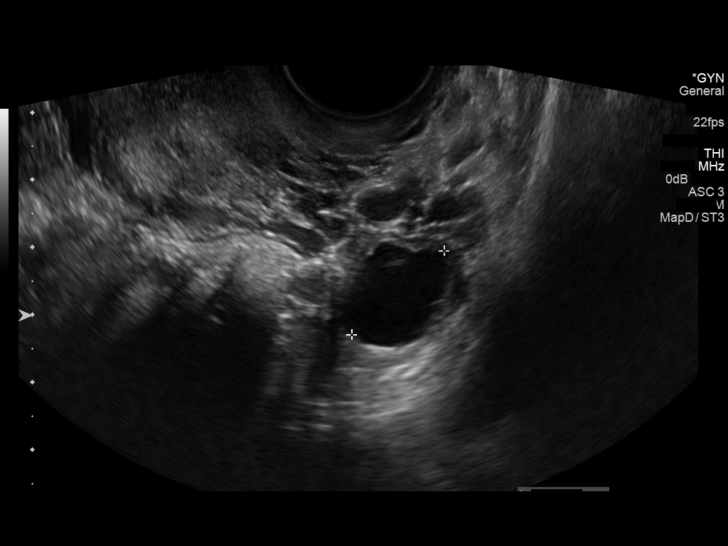
[im 39/59]
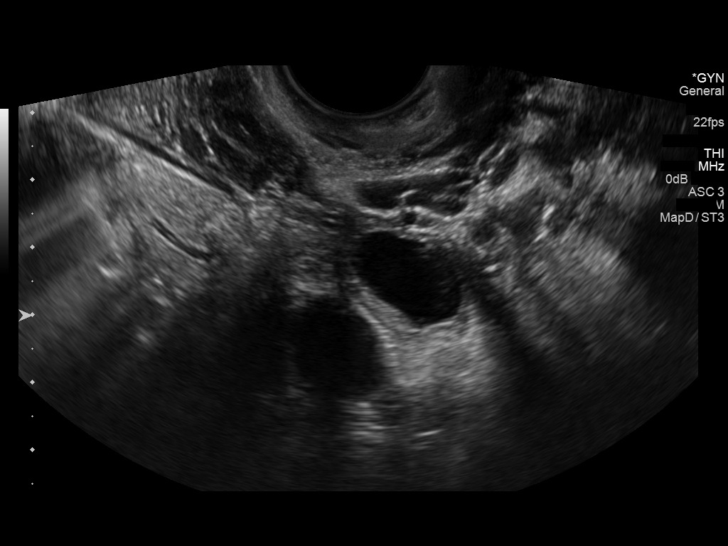
[im 44/59]
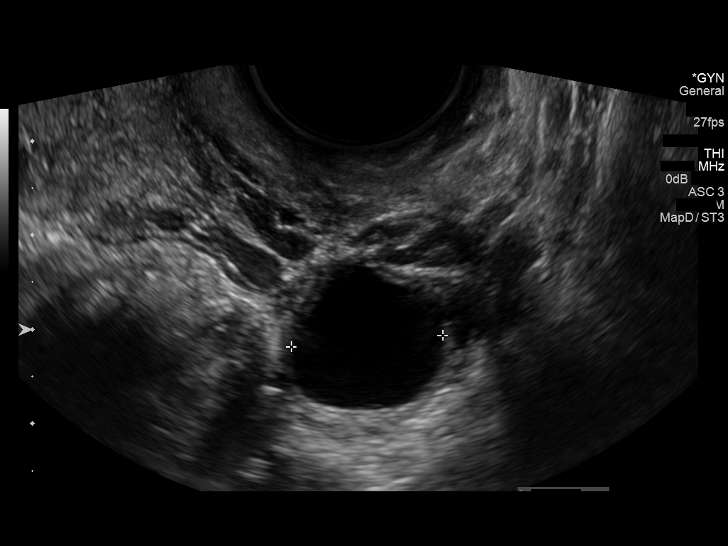
[im 49/59]
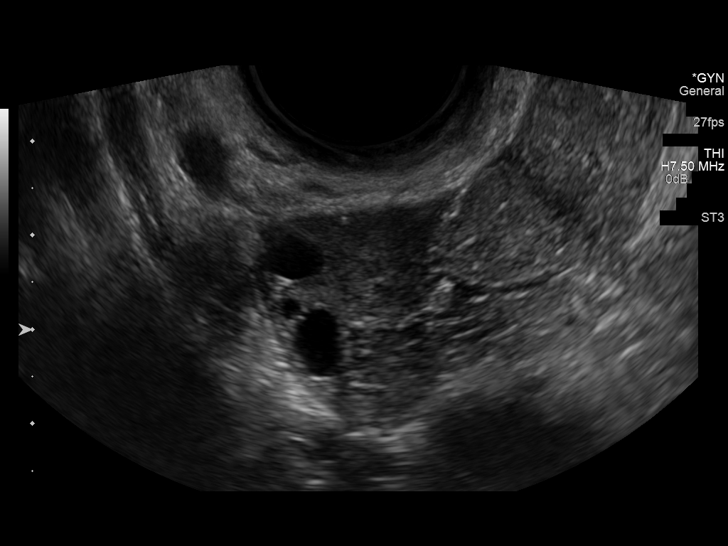
[im 54/59]
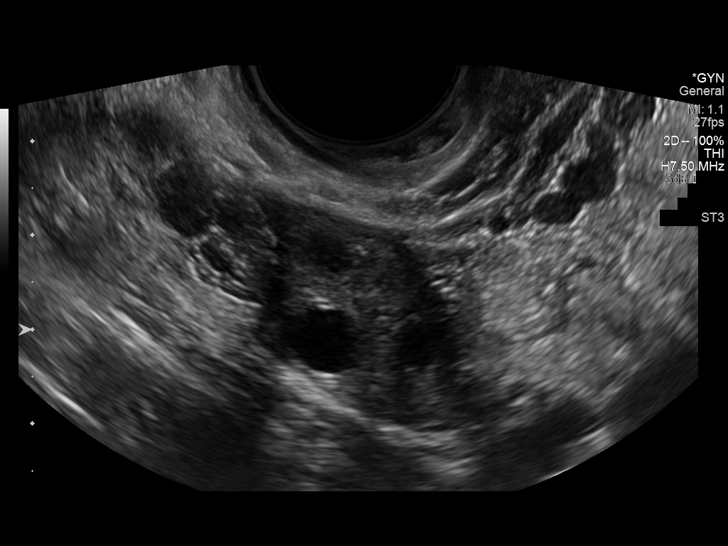
[im 59/59]
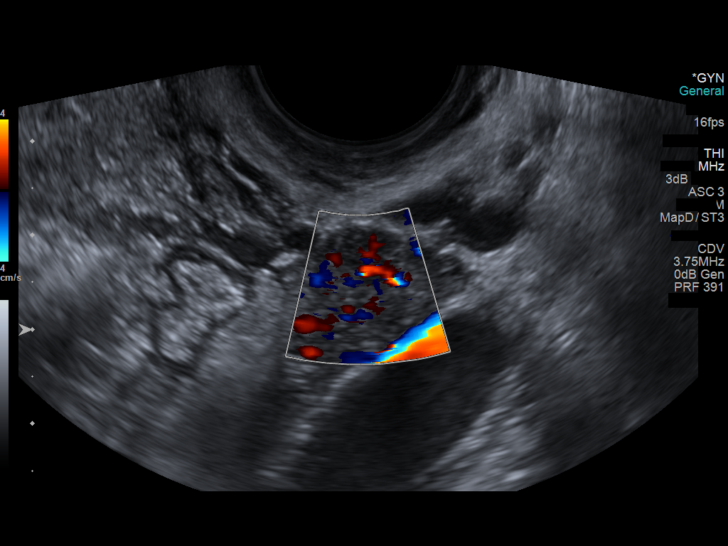

[14 of 25 positions shown; findings below may reference images not displayed]

FINDINGS: Uterus

Measurements: 9.8 x 4.2 x 5.2 cm. No fibroids or other mass
visualized.

Endometrium

Thickness: 3 mm.  No focal abnormality visualized.

Right ovary

Measurements: 3.2 x 1.8 x 2.1 cm. Normal appearance/no adnexal mass.

Left ovary

Measurements: 2.4 x 1.6 x 2.4 cm. There is a 1.9 cm simple
unilocular left ovarian cyst. Otherwise normal left ovary.

Pulsed Doppler evaluation of both ovaries demonstrates normal
low-resistance arterial and venous waveforms.

Other findings

No free fluid.
IMPRESSION: Normal

## 2016-09-20 ENCOUNTER — Emergency Department (HOSPITAL_COMMUNITY)
Admission: EM | Admit: 2016-09-20 | Discharge: 2016-09-21 | Disposition: A | Discharge status: Home / Self Care | Payer: Medicaid Other | Attending: Emergency Medicine | Admitting: Emergency Medicine

## 2016-09-20 ENCOUNTER — Encounter (HOSPITAL_COMMUNITY): Payer: Self-pay | Admitting: Emergency Medicine

## 2016-09-20 DIAGNOSIS — R0981 Nasal congestion: Secondary | ICD-10-CM | POA: Diagnosis present

## 2016-09-20 DIAGNOSIS — R69 Illness, unspecified: Secondary | ICD-10-CM

## 2016-09-20 DIAGNOSIS — J111 Influenza due to unidentified influenza virus with other respiratory manifestations: Secondary | ICD-10-CM

## 2016-09-20 NOTE — ED Provider Notes (Signed)
[facility]-EMERGENCY DEPT Provider Note   CSN: [id number] Arrival date & time: 09/20/16  2332  By signing my name below, I, Linna Darner, attest that this documentation has been prepared under the direction and in the presence of physician practitioner, Dione Booze, MD. Electronically Signed: Linna Darner, Scribe. 09/20/2016. 11:54 PM.  History   Chief Complaint Chief Complaint  Patient presents with  . Generalized Body Aches  . Nasal Congestion    The history is provided by the patient. No language interpreter was used.     HPI Comments: Nicole Howard is a 26 y.o. female who presents to the Emergency Department complaining of a persistent cough beginning yesterday. She states her cough is occasionally productive with green sputum. She reports associated rhinorrhea, nasal congestion, and body aches. Pt states her nasal mucus is alternately clear and green. She did not receive a flu vaccination this season. She denies nausea, vomiting, diarrhea, fever, chills, or any other associated symptoms.  Past Medical History:  Diagnosis Date  . Medical history non-contributory     Patient Active Problem List   Diagnosis Date Noted  . Active labor 06/01/2012  . Normal delivery 06/01/2012    Past Surgical History:  Procedure Laterality Date  . FOREIGN BODY REMOVAL Left 09/18/2015   Procedure: FOREIGN BODY REMOVAL OF BIRTH CONTROL DEVICE IN LEFT ARM ADULT;  Surgeon: Luretha Murphy, MD;  Location: [facility];  Service: General;  Laterality: Left;  . NO PAST SURGERIES      OB History    Gravida Para Term Preterm AB Living   3 2 2     2    SAB TAB Ectopic Multiple Live Births           2       Home Medications    Prior to Admission medications   Medication Sig Start Date End Date Taking? Authorizing Provider  acetaminophen (TYLENOL) 325 MG tablet Take 650 mg by mouth every 6 (six) hours as needed for mild pain or headache.    Historical Provider, MD    Family  History Family History  Problem Relation Age of Onset  . Adopted: Yes  . Other Neg Hx     Social History Social History  Substance Use Topics  . Smoking status: Never Smoker  . Smokeless tobacco: Never Used  . Alcohol use No     Allergies   Tramadol   Review of Systems Review of Systems  Constitutional: Negative for chills and fever.  HENT: Positive for congestion and rhinorrhea.   Respiratory: Positive for cough.   Gastrointestinal: Negative for diarrhea, nausea and vomiting.  Musculoskeletal: Positive for myalgias.  All other systems reviewed and are negative.    Physical Exam Updated Vital Signs BP 135/90 (BP Location: Left Arm)   Pulse 77   Temp 98.1 F (36.7 C) (Oral)   Resp 20   Wt 106 lb (48.1 kg)   LMP 12/28/2015   SpO2 100%   BMI 18.19 kg/m   Physical Exam  Constitutional: She is oriented to person, place, and time. She appears well-developed and well-nourished.  HENT:  Head: Normocephalic and atraumatic.  Right Ear: Tympanic membrane normal.  Left Ear: Tympanic membrane normal.  TM's normal.  Eyes: EOM are normal. Pupils are equal, round, and reactive to light.  Neck: Normal range of motion. Neck supple. No JVD present.  Cardiovascular: Normal rate, regular rhythm and normal heart sounds.   No murmur heard. Pulmonary/Chest: Effort normal and breath sounds normal. She has  no wheezes. She has no rales. She exhibits no tenderness.  Abdominal: Soft. Bowel sounds are normal. She exhibits no distension and no mass. There is no tenderness.  Musculoskeletal: Normal range of motion. She exhibits no edema.  Lymphadenopathy:    She has no cervical adenopathy.  Neurological: She is alert and oriented to person, place, and time. No cranial nerve deficit. She exhibits normal muscle tone. Coordination normal.  Skin: Skin is warm and dry. No rash noted.  Psychiatric: She has a normal mood and affect. Her behavior is normal. Judgment and thought content normal.   Nursing note and vitals reviewed.    ED Treatments / Results   Procedures Procedures (including critical care time)  DIAGNOSTIC STUDIES: Oxygen Saturation is 100% on RA, normal by my interpretation.    COORDINATION OF CARE: 12:03 AM Discussed treatment plan with pt at bedside and pt agreed to plan.  Medications Ordered in ED Medications - No data to display   Initial Impression / Assessment and Plan / ED Course  I have reviewed the triage vital signs and the nursing notes.  Flulike illness. No indication for imaging or lab testing. Symptoms have been present too long to institute antiviral therapy. Patient is advised to continue over-the-counter medications.  Final Clinical Impressions(s) / ED Diagnoses   Final diagnoses:  Influenza-like illness    New Prescriptions New Prescriptions   No medications on file   I personally performed the services described in this documentation, which was scribed in my presence. The recorded information has been reviewed and is accurate.      Dione Boozeavid Cortni Tays, MD 09/21/16 [phone]

## 2016-09-20 NOTE — ED Triage Notes (Signed)
Patient complaining of congestion, body aches, and just not feeling good. Patient has been sick since Wednesday per patient.

## 2016-12-19 ENCOUNTER — Encounter (HOSPITAL_COMMUNITY): Payer: Self-pay

## 2017-01-04 ENCOUNTER — Encounter (HOSPITAL_COMMUNITY): Payer: Self-pay | Admitting: Emergency Medicine

## 2017-01-04 ENCOUNTER — Emergency Department (HOSPITAL_COMMUNITY)
Admission: EM | Admit: 2017-01-04 | Discharge: 2017-01-04 | Disposition: A | Discharge status: Home / Self Care | Payer: Self-pay | Attending: Emergency Medicine | Admitting: Emergency Medicine

## 2017-01-04 ENCOUNTER — Emergency Department (HOSPITAL_COMMUNITY): Payer: Self-pay

## 2017-01-04 DIAGNOSIS — R1031 Right lower quadrant pain: Secondary | ICD-10-CM

## 2017-01-04 DIAGNOSIS — N760 Acute vaginitis: Secondary | ICD-10-CM | POA: Insufficient documentation

## 2017-01-04 DIAGNOSIS — B9689 Other specified bacterial agents as the cause of diseases classified elsewhere: Secondary | ICD-10-CM | POA: Insufficient documentation

## 2017-01-04 DIAGNOSIS — Z79899 Other long term (current) drug therapy: Secondary | ICD-10-CM | POA: Insufficient documentation

## 2017-01-04 LAB — COMPREHENSIVE METABOLIC PANEL
ALT: 16 U/L (ref 14–54)
AST: 16 U/L (ref 15–41)
Albumin: 3.9 g/dL (ref 3.5–5.0)
Alkaline Phosphatase: 65 U/L (ref 38–126)
Anion gap: 6 (ref 5–15)
BUN: 11 mg/dL (ref 6–20)
CO2: 29 mmol/L (ref 22–32)
Calcium: 9 mg/dL (ref 8.9–10.3)
Chloride: 107 mmol/L (ref 101–111)
Creatinine, Ser: 0.7 mg/dL (ref 0.44–1.00)
GFR calc Af Amer: >60 mL/min (ref >60)
GFR calc non Af Amer: >60 mL/min (ref >60)
Glucose, Bld: 75 mg/dL (ref 65–99)
Potassium: 3.7 mmol/L (ref 3.5–5.1)
Sodium: 142 mmol/L (ref 135–145)
Total Bilirubin: 0.8 mg/dL (ref 0.3–1.2)
Total Protein: 6.9 g/dL (ref 6.5–8.1)

## 2017-01-04 LAB — CBC
HCT: 33.7 % — ABNORMAL LOW (ref 36.0–46.0)
Hemoglobin: 11.2 g/dL — ABNORMAL LOW (ref 12.0–15.0)
MCH: 29.6 pg (ref 26.0–34.0)
MCHC: 33.2 g/dL (ref 30.0–36.0)
MCV: 88.9 fL (ref 78.0–100.0)
Platelets: 236 10*3/uL (ref 150–400)
RBC: 3.79 MIL/uL — ABNORMAL LOW (ref 3.87–5.11)
RDW: 13.2 % (ref 11.5–15.5)
WBC: 10.3 10*3/uL (ref 4.0–10.5)

## 2017-01-04 LAB — URINALYSIS, ROUTINE W REFLEX MICROSCOPIC
Bacteria, UA: NONE SEEN
Bilirubin Urine: NEGATIVE
Glucose, UA: NEGATIVE mg/dL
Hgb urine dipstick: NEGATIVE
Ketones, ur: NEGATIVE mg/dL
Nitrite: NEGATIVE
Protein, ur: NEGATIVE mg/dL
Specific Gravity, Urine: 1.025 (ref 1.005–1.030)
pH: 8 (ref 5.0–8.0)

## 2017-01-04 LAB — WET PREP, GENITAL
Sperm: NONE SEEN
Trich, Wet Prep: NONE SEEN
Yeast Wet Prep HPF POC: NONE SEEN

## 2017-01-04 LAB — LIPASE, BLOOD: Lipase: 28 U/L (ref 11–51)

## 2017-01-04 LAB — I-STAT BETA HCG BLOOD, ED (MC, WL, AP ONLY): I-stat hCG, quantitative: <5 m[IU]/mL (ref <5)

## 2017-01-04 MED ORDER — SODIUM CHLORIDE 0.9 % IV BOLUS (SEPSIS)
1000.0000 mL | Freq: Once | INTRAVENOUS | Status: AC
  Administered 2017-01-04: 1000 mL via INTRAVENOUS

## 2017-01-04 MED ORDER — IBUPROFEN 600 MG PO TABS: 600.0000 mg | ORAL_TABLET | Freq: Four times a day (QID) | ORAL | 0 refills | Status: DC | PRN

## 2017-01-04 MED ORDER — ONDANSETRON HCL 4 MG/2ML IJ SOLN
4.0000 mg | Freq: Once | INTRAMUSCULAR | Status: AC
  Administered 2017-01-04: 4 mg via INTRAVENOUS
  Filled 2017-01-04: qty 2

## 2017-01-04 MED ORDER — MORPHINE SULFATE (PF) 4 MG/ML IV SOLN
4.0000 mg | Freq: Once | INTRAVENOUS | Status: AC
  Administered 2017-01-04: 4 mg via INTRAVENOUS
  Filled 2017-01-04: qty 1

## 2017-01-04 MED ORDER — METRONIDAZOLE 500 MG PO TABS: 500.0000 mg | ORAL_TABLET | Freq: Two times a day (BID) | ORAL | 0 refills | Status: DC

## 2017-01-04 MED ORDER — IOPAMIDOL (ISOVUE-300) INJECTION 61%
100.0000 mL | Freq: Once | INTRAVENOUS | Status: AC | PRN
  Administered 2017-01-04: 80 mL via INTRAVENOUS

## 2017-01-04 NOTE — ED Provider Notes (Signed)
[facility]-EMERGENCY DEPT Provider Note   CSN: [id number] Arrival date & time: 01/04/17  1540     History   Chief Complaint Chief Complaint  Patient presents with  . Abdominal Pain    HPI Nicole Howard is a 26 y.o. female.  Pt presents to the ED with RLQ pain (triage says llq, but it is right) for the past 2 weeks.  Pt denies any other sx.  She said her lmp was 2 weeks ago.        Past Medical History:  Diagnosis Date  . Medical history non-contributory     Patient Active Problem List   Diagnosis Date Noted  . Active labor 06/01/2012  . Normal delivery 06/01/2012    Past Surgical History:  Procedure Laterality Date  . FOREIGN BODY REMOVAL Left 09/18/2015   Procedure: FOREIGN BODY REMOVAL OF BIRTH CONTROL DEVICE IN LEFT ARM ADULT;  Surgeon: Luretha Murphy, MD;  Location: [facility];  Service: General;  Laterality: Left;  . NO PAST SURGERIES      OB History    Gravida Para Term Preterm AB Living   3 2 2     2    SAB TAB Ectopic Multiple Live Births           2       Home Medications    Prior to Admission medications   Medication Sig Start Date End Date Taking? Authorizing Provider  levonorgestrel (MIRENA, 52 MG,) 20 MCG/24HR IUD 1 each by Intrauterine route once.   Yes [provider]  acetaminophen (TYLENOL) 325 MG tablet Take 650 mg by mouth every 6 (six) hours as needed for mild pain or headache.    [provider]  ibuprofen (ADVIL,MOTRIN) 600 MG tablet Take 1 tablet (600 mg total) by mouth every 6 (six) hours as needed. 01/04/17   Jacalyn Lefevre, MD  metroNIDAZOLE (FLAGYL) 500 MG tablet Take 1 tablet (500 mg total) by mouth 2 (two) times daily. 01/04/17   Jacalyn Lefevre, MD    Family History Family History  Problem Relation Age of Onset  . Adopted: Yes  . Other Neg Hx     Social History Social History  Substance Use Topics  . Smoking status: Never Smoker  . Smokeless tobacco: Never Used  . Alcohol use No      Allergies   Tramadol   Review of Systems Review of Systems  Gastrointestinal: Positive for abdominal pain.  All other systems reviewed and are negative.    Physical Exam Updated Vital Signs BP 111/69 (BP Location: Right Arm)   Pulse 73   Temp 98.2 F (36.8 C) (Oral)   Resp 14   SpO2 100%   Physical Exam  Constitutional: She is oriented to person, place, and time. She appears well-developed and well-nourished.  HENT:  Head: Normocephalic and atraumatic.  Right Ear: External ear normal.  Left Ear: External ear normal.  Nose: Nose normal.  Mouth/Throat: Oropharynx is clear and moist.  Eyes: Conjunctivae and EOM are normal. Pupils are equal, round, and reactive to light.  Neck: Normal range of motion. Neck supple.  Cardiovascular: Normal rate, regular rhythm, normal heart sounds and intact distal pulses.   Pulmonary/Chest: Effort normal and breath sounds normal.  Abdominal: Soft. Bowel sounds are normal. There is tenderness in the right lower quadrant.  Genitourinary: Vaginal discharge found.  Musculoskeletal: Normal range of motion.  Neurological: She is alert and oriented to person, place, and time.  Skin: Skin is warm.  Psychiatric: She  has a normal mood and affect. Her behavior is normal. Judgment and thought content normal.  Nursing note and vitals reviewed.    ED Treatments / Results  Labs (all labs ordered are listed, but only abnormal results are displayed) Labs Reviewed  WET PREP, GENITAL - Abnormal; Notable for the following:       Result Value   Clue Cells Wet Prep HPF POC PRESENT (*)    WBC, Wet Prep HPF POC MANY (*)    All other components within normal limits  CBC - Abnormal; Notable for the following:    RBC 3.79 (*)    Hemoglobin 11.2 (*)    HCT 33.7 (*)    All other components within normal limits  URINALYSIS, ROUTINE W REFLEX MICROSCOPIC - Abnormal; Notable for the following:    APPearance HAZY (*)    Leukocytes, UA TRACE (*)     Squamous Epithelial / LPF 6-30 (*)    All other components within normal limits  LIPASE, BLOOD  COMPREHENSIVE METABOLIC PANEL  I-STAT BETA HCG BLOOD, ED ([facility], [facility], [facility] ONLY)  GC/CHLAMYDIA PROBE AMP ([place]) NOT AT [facility]    EKG  EKG Interpretation None       Radiology Ct Abdomen Pelvis W Contrast  Result Date: 01/04/2017 CLINICAL DATA:  Left lower quadrant pain for the past 2 weeks. EXAM: CT ABDOMEN AND PELVIS WITH CONTRAST TECHNIQUE: Multidetector CT imaging of the abdomen and pelvis was performed using the standard protocol following bolus administration of intravenous contrast. CONTRAST:  80mL ISOVUE-300 IOPAMIDOL (ISOVUE-300) INJECTION 61% COMPARISON:  None. FINDINGS: Lower chest: Clear lung bases.  Heart normal size. Hepatobiliary: 4 mm low-density lesion in the lateral segment of the left lobe consistent with a cyst. Liver otherwise unremarkable. Normal gallbladder. No bile duct dilation. Pancreas: Unremarkable. No pancreatic ductal dilatation or surrounding inflammatory changes. Spleen: Normal in size without focal abnormality. Adrenals/Urinary Tract: Adrenal glands are unremarkable. Kidneys are normal, without renal calculi, focal lesion, or hydronephrosis. Bladder is unremarkable. Stomach/Bowel: Stomach is unremarkable. No bowel dilation to suggest obstruction or adynamic ileus. No wall thickening or adjacent inflammation. Appendix not visualized. No evidence of appendicitis. Vascular/Lymphatic: No significant vascular findings are present. No enlarged abdominal or pelvic lymph nodes. Reproductive: Well-positioned IUD. Uterus otherwise unremarkable. No adnexal masses. Other: Trace, presumed physiologic, pelvic free fluid.  No hernias. Musculoskeletal: Normal. IMPRESSION: 1. No acute findings. No findings to account for the patient's left lower quadrant abdominal pain. 2. 4 mm low-density liver lesion consistent with cysts. Exam otherwise normal. Electronically Signed   By: Amie Portlandavid  Ormond  M.D.   On: 01/04/2017 17:52    Procedures Procedures (including critical care time)  Medications Ordered in ED Medications  sodium chloride 0.9 % bolus 1,000 mL (0 mLs Intravenous Stopped 01/04/17 1800)  morphine 4 MG/ML injection 4 mg (4 mg Intravenous Given 01/04/17 1642)  ondansetron (ZOFRAN) injection 4 mg (4 mg Intravenous Given 01/04/17 1642)  iopamidol (ISOVUE-300) 61 % injection 100 mL (80 mLs Intravenous Contrast Given 01/04/17 1722)     Initial Impression / Assessment and Plan / ED Course  I have reviewed the triage vital signs and the nursing notes.  Pertinent labs & imaging results that were available during my care of the patient were reviewed by me and considered in my medical decision making (see chart for details).    Pt is feeling better.  She knows that sexual partners need to be treated.  Return if worse and f/u with women's.  Final Clinical Impressions(s) /  ED Diagnoses   Final diagnoses:  Right lower quadrant abdominal pain  Bacterial vaginosis    New Prescriptions Discharge Medication List as of 01/04/2017  6:09 PM    START taking these medications   Details  ibuprofen (ADVIL,MOTRIN) 600 MG tablet Take 1 tablet (600 mg total) by mouth every 6 (six) hours as needed., Starting Sat 01/04/2017, Print    metroNIDAZOLE (FLAGYL) 500 MG tablet Take 1 tablet (500 mg total) by mouth 2 (two) times daily., Starting Sat 01/04/2017, Print         Jacalyn Lefevre, MD 01/04/17 [phone]

## 2017-01-04 NOTE — ED Triage Notes (Signed)
Pt reports LLQ pain for the past 2 weeks. No n/v/d. LMP 2 weeks ago.

## 2017-01-06 LAB — GC/CHLAMYDIA PROBE AMP (~~LOC~~) NOT AT ARMC
Chlamydia: NEGATIVE
Neisseria Gonorrhea: NEGATIVE

## 2017-01-28 ENCOUNTER — Emergency Department (HOSPITAL_COMMUNITY)
Admission: EM | Admit: 2017-01-28 | Discharge: 2017-01-28 | Disposition: A | Discharge status: Home / Self Care | Payer: Managed Care, Other (non HMO) | Attending: Emergency Medicine | Admitting: Emergency Medicine

## 2017-01-28 ENCOUNTER — Encounter (HOSPITAL_COMMUNITY): Payer: Self-pay | Admitting: Emergency Medicine

## 2017-01-28 DIAGNOSIS — J029 Acute pharyngitis, unspecified: Secondary | ICD-10-CM | POA: Insufficient documentation

## 2017-01-28 DIAGNOSIS — Z79899 Other long term (current) drug therapy: Secondary | ICD-10-CM | POA: Insufficient documentation

## 2017-01-28 LAB — RAPID STREP SCREEN (MED CTR MEBANE ONLY): Streptococcus, Group A Screen (Direct): NEGATIVE

## 2017-01-28 MED ORDER — DEXAMETHASONE SODIUM PHOSPHATE 10 MG/ML IJ SOLN
10.0000 mg | Freq: Once | INTRAMUSCULAR | Status: AC
  Administered 2017-01-28: 10 mg via INTRAMUSCULAR
  Filled 2017-01-28: qty 1

## 2017-01-28 MED ORDER — PENICILLIN G BENZATHINE 1200000 UNIT/2ML IM SUSP
1.2000 10*6.[IU] | Freq: Once | INTRAMUSCULAR | Status: AC
  Administered 2017-01-28: 1.2 10*6.[IU] via INTRAMUSCULAR
  Filled 2017-01-28: qty 2

## 2017-01-28 MED ORDER — HYDROCODONE-ACETAMINOPHEN 7.5-325 MG/15ML PO SOLN: 15.0000 mL | Freq: Three times a day (TID) | ORAL | 0 refills | Status: DC | PRN

## 2017-01-28 MED ORDER — IBUPROFEN 200 MG PO TABS
600.0000 mg | ORAL_TABLET | Freq: Once | ORAL | Status: AC
  Administered 2017-01-28: 600 mg via ORAL
  Filled 2017-01-28: qty 3

## 2017-01-28 NOTE — ED Triage Notes (Signed)
Patient c/o sore throat since this morning. Denies fever, cough, congestion.

## 2017-01-28 NOTE — Discharge Instructions (Signed)
Take your medication as prescribed as needed for pain relief. I recommend continuing to take Tylenol and ibuprofen as prescribed over-the-counter for additional pain relief. Continue drinking fluids at home to remain hydrated. You remain contagious for the next 24 hours after receiving your shot of antibiotics in the ED. Follow-up with your primary care provider within the next 3-4 days if your symptoms have not improved. Please return to the Emergency Department if symptoms worsen or new onset of fever, facial/neck swelling, difficulty swallowing resulting in drooling, difficulty breathing, vomiting.

## 2017-01-28 NOTE — ED Provider Notes (Signed)
[facility]-EMERGENCY DEPT Provider Note   CSN: [id number] Arrival date & time: 01/28/17  1352  By signing my name below, I, Diona BrownerJennifer Gorman, attest that this documentation has been prepared under the direction and in the presence of [facility] Center[facility]ily West, PA-C.  Electronically Signed: Diona BrownerJennifer Gorman, ED Scribe. 01/28/17. 2:06 PM.  History   Chief Complaint Chief Complaint  Patient presents with  . Sore Throat    HPI Nicole Howard is a 26 y.o. female who presents to the Emergency Department complaining of a sudden sore throat that started this morning. Associated sx include trouble swallowing. Not sure of anyone sick around her. No medication taken at home for sx. Pt denies fever, cough, congestion, rhinorrhea, facial swelling, dysphagia, ear pain, nausea, vomiting, and abdominal pain.  The history is provided by the patient. No language interpreter was used.    Past Medical History:  Diagnosis Date  . Medical history non-contributory     Patient Active Problem List   Diagnosis Date Noted  . Active labor 06/01/2012  . Normal delivery 06/01/2012    Past Surgical History:  Procedure Laterality Date  . FOREIGN BODY REMOVAL Left 09/18/2015   Procedure: FOREIGN BODY REMOVAL OF BIRTH CONTROL DEVICE IN LEFT ARM ADULT;  Surgeon: Luretha MurphyMatthew Martin, MD;  Location: [facility];  Service: General;  Laterality: Left;  . NO PAST SURGERIES      OB History    Gravida Para Term Preterm AB Living   3 2 2     2    SAB TAB Ectopic Multiple Live Births           2       Home Medications    Prior to Admission medications   Medication Sig Start Date End Date Taking? Authorizing Provider  acetaminophen (TYLENOL) 325 MG tablet Take 650 mg by mouth every 6 (six) hours as needed for mild pain or headache.    [provider]  HYDROcodone-acetaminophen (HYCET) 7.5-325 mg/15 ml solution Take 15 mLs by mouth every 8 (eight) hours as needed for moderate pain. 01/28/17   Barrett HenleNadeau, Nicole  Elizabeth, PA-C  ibuprofen (ADVIL,MOTRIN) 600 MG tablet Take 1 tablet (600 mg total) by mouth every 6 (six) hours as needed. 01/04/17   Jacalyn LefevreHaviland, Julie, MD  levonorgestrel (MIRENA, 52 MG,) 20 MCG/24HR IUD 1 each by Intrauterine route once.    [provider]  metroNIDAZOLE (FLAGYL) 500 MG tablet Take 1 tablet (500 mg total) by mouth 2 (two) times daily. 01/04/17   Jacalyn LefevreHaviland, Julie, MD    Family History Family History  Problem Relation Age of Onset  . Adopted: Yes  . Other Neg Hx     Social History Social History  Substance Use Topics  . Smoking status: Never Smoker  . Smokeless tobacco: Never Used  . Alcohol use No     Allergies   Tramadol   Review of Systems Review of Systems  Constitutional: Negative for fever.  HENT: Positive for sore throat and trouble swallowing. Negative for congestion, ear pain, facial swelling and rhinorrhea.   Respiratory: Negative for cough.   Gastrointestinal: Negative for abdominal pain, nausea and vomiting.   Physical Exam Updated Vital Signs BP 135/68 (BP Location: Left Arm)   Pulse 79   Temp 98.4 F (36.9 C) (Oral)   Resp 18   Ht 5\' 4"  (1.626 m)   Wt 49.9 kg (110 lb)   LMP 01/28/2017   SpO2 100%   BMI 18.88 kg/m   Physical Exam  Constitutional: She  is oriented to person, place, and time. She appears well-developed and well-nourished. No distress.  HENT:  Head: Normocephalic and atraumatic.  Mouth/Throat: Uvula is midline and mucous membranes are normal. No trismus in the jaw. No uvula swelling. Posterior oropharyngeal edema and posterior oropharyngeal erythema present. No oropharyngeal exudate or tonsillar abscesses. Tonsils are 2+ on the right. Tonsils are 2+ on the left. No tonsillar exudate.  Moderate swelling and erythema present to bilateral tonsils and posterior oral pharynx. No drooling. No muffled voice. No facial or neck swelling. Pt tolerating secretions.   Eyes: Conjunctivae and EOM are normal. Pupils are equal,  round, and reactive to light. Right eye exhibits no discharge. Left eye exhibits no discharge. No scleral icterus.  Neck: Normal range of motion. Neck supple.  Cardiovascular: Normal rate, regular rhythm, normal heart sounds and intact distal pulses.   Pulmonary/Chest: Effort normal and breath sounds normal. No respiratory distress. She has no wheezes. She has no rales. She exhibits no tenderness.  Abdominal: Soft. She exhibits no distension. There is no tenderness.  Musculoskeletal: Normal range of motion. She exhibits no edema or tenderness.  Lymphadenopathy:    She has cervical adenopathy (bilateral submandibular).  Neurological: She is alert and oriented to person, place, and time.  Skin: Skin is warm and dry. She is not diaphoretic.  Nursing note and vitals reviewed.    ED Treatments / Results  DIAGNOSTIC STUDIES: Oxygen Saturation is 100% on RA, normal by my interpretation.   COORDINATION OF CARE: 2:06 PM-Discussed next steps with pt which includes shot of steroids. Pt verbalized understanding and is agreeable with the plan.   Labs (all labs ordered are listed, but only abnormal results are displayed) Labs Reviewed  RAPID STREP SCREEN (NOT AT [facility])  CULTURE, GROUP A STREP [facility])    EKG  EKG Interpretation None       Radiology No results found.  Procedures Procedures (including critical care time)  Medications Ordered in ED Medications  penicillin g benzathine (BICILLIN LA) 1200000 UNIT/2ML injection 1.2 Million Units (not administered)  dexamethasone (DECADRON) injection 10 mg (10 mg Intramuscular Given 01/28/17 1427)  ibuprofen (ADVIL,MOTRIN) tablet 600 mg (600 mg Oral Given 01/28/17 1427)     Initial Impression / Assessment and Plan / ED Course  I have reviewed the triage vital signs and the nursing notes.  Pertinent labs & imaging results that were available during my care of the patient were reviewed by me and considered in my medical decision making (see  chart for details).     Patient presents with sore throat that started this morning and has worsened throughout the day. Denies associated symptoms or any known sick contacts. VSS. Exam revealed moderate swelling and erythema to bilateral tonsils and posterior oropharynx. No trismus or drooling. No facial swelling. Uvula midline. Patient tolerating secretions. Remaining exam unremarkable. No evidence suggesting peritonsillar abscess at this time. Centor score 3. Patient given IM steroids and by mouth ibuprofen in the ED. Tolerating PO. Rapid strep negative, however due to pt sxs and exam concerning for strep pharyngitis, plan to treat patient with single IM injection of penicillin. Discussed results and plan for discharge with patient. Advised patient to follow up with PCP as needed. Discussed return precautions.  Final Clinical Impressions(s) / ED Diagnoses   Final diagnoses:  Pharyngitis, unspecified etiology    New Prescriptions New Prescriptions   HYDROCODONE-ACETAMINOPHEN (HYCET) 7.5-325 MG/15 ML SOLUTION    Take 15 mLs by mouth every 8 (eight) hours as needed for moderate  pain.   I personally performed the services described in this documentation, which was scribed in my presence. The recorded information has been reviewed and is accurate.     Barrett Henle, PA-C 01/28/17 1448    Benjiman Core, MD 01/28/17 [phone]

## 2017-01-31 LAB — CULTURE, GROUP A STREP (THRC)

## 2017-02-20 IMAGING — US US OB TRANSVAGINAL
1 series · 14 of 28 positions shown · non-contrast
Comparison: None.

CLINICAL DATA: Pain cramping for 1 day, pregnant ; EGA 4 weeks 5
days by LMP

EXAM:
OBSTETRIC <14 WK US AND TRANSVAGINAL OB US
TECHNIQUE: Both transabdominal and transvaginal ultrasound examinations were
performed for complete evaluation of the gestation as well as the
maternal uterus, adnexal regions, and pelvic cul-de-sac.
Transvaginal technique was performed to assess early pregnancy.

[Series 1: us ob transvaginal · 0.21mm/px · 14 of 66 slices shown]
[im 3/66]
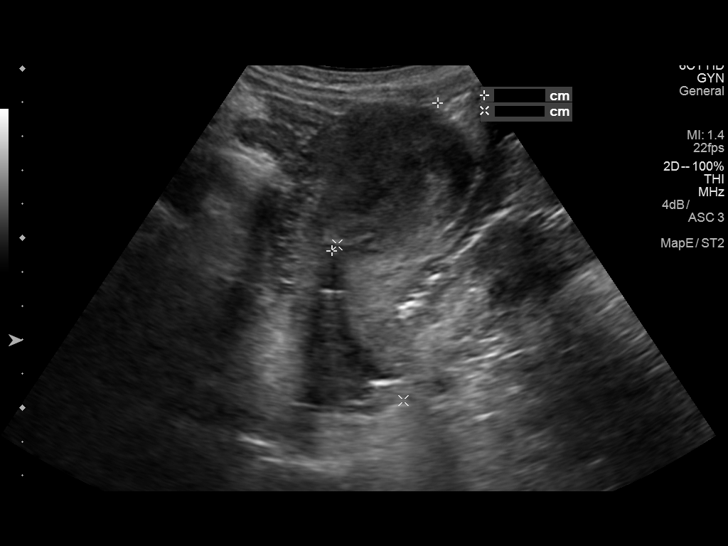
[im 8/66]
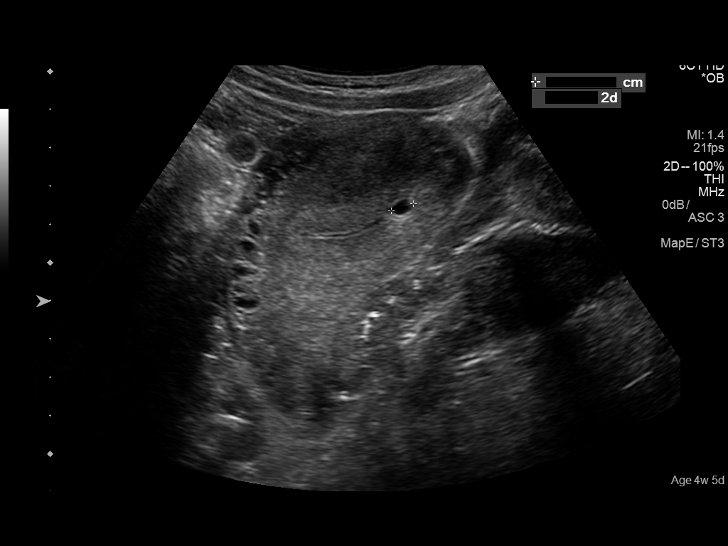
[im 13/66]
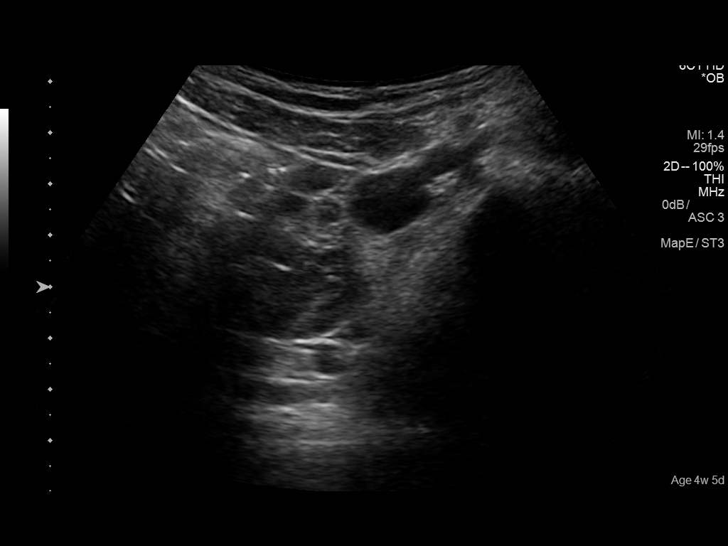
[im 17/66]
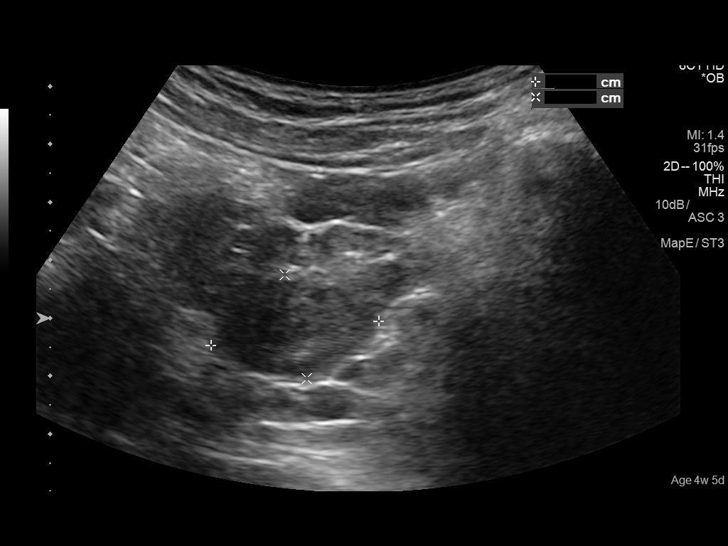
[im 22/66]
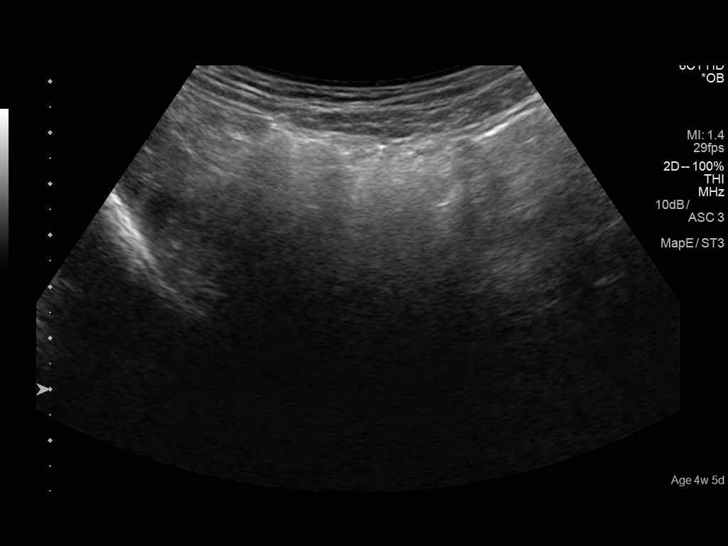
[im 27/66]
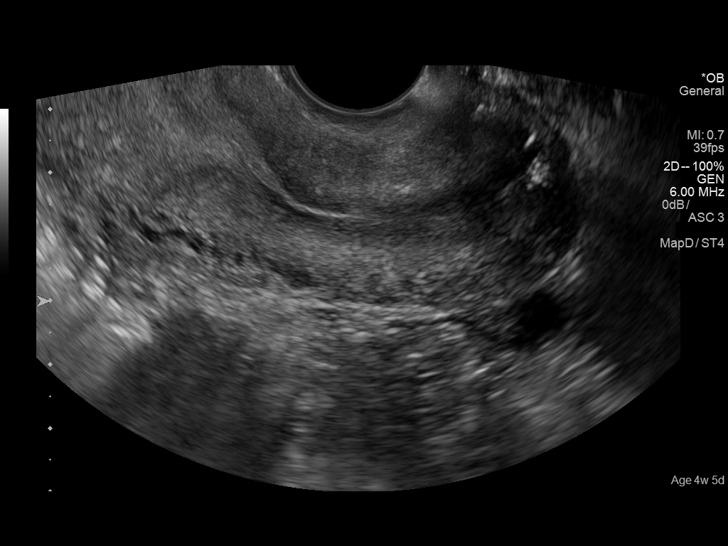
[im 32/66]
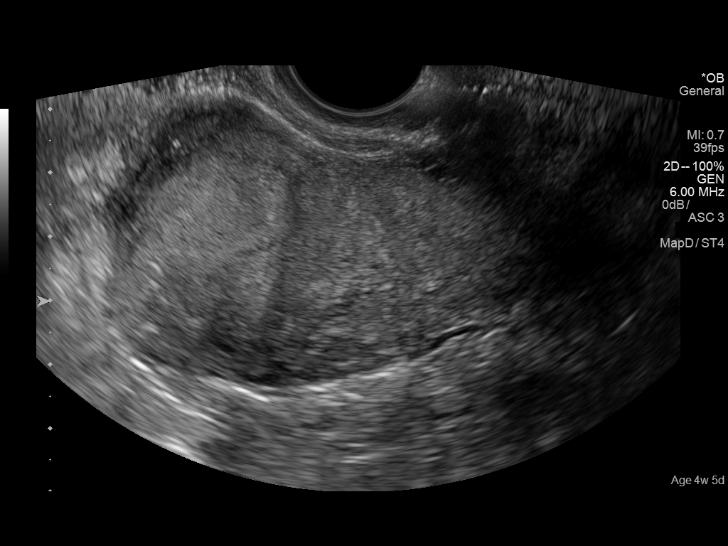
[im 37/66]
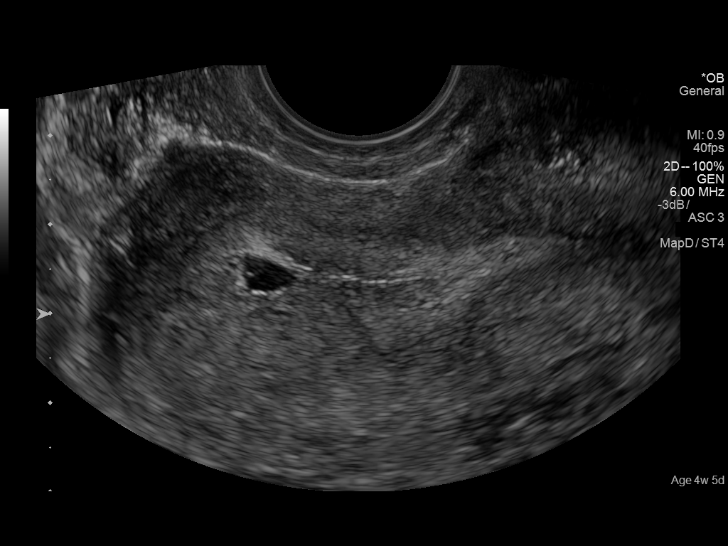
[im 41/66]
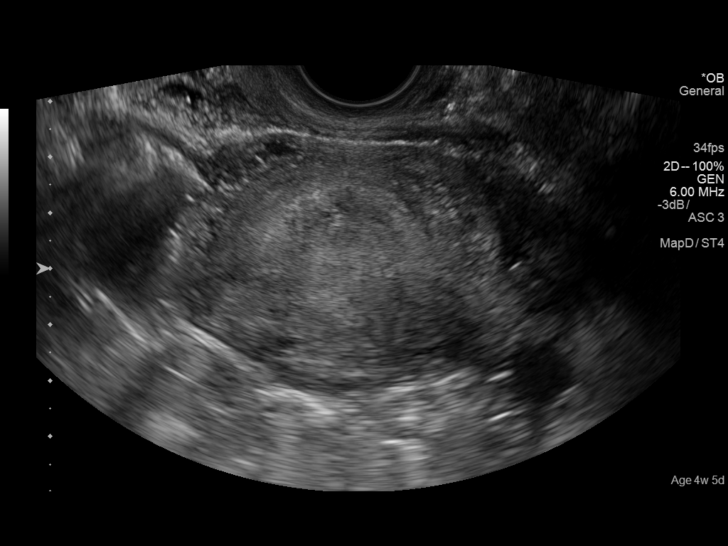
[im 46/66]
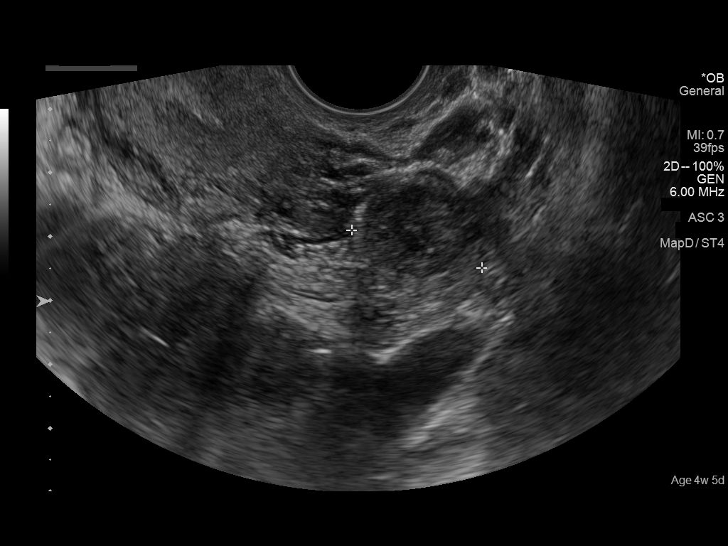
[im 51/66]
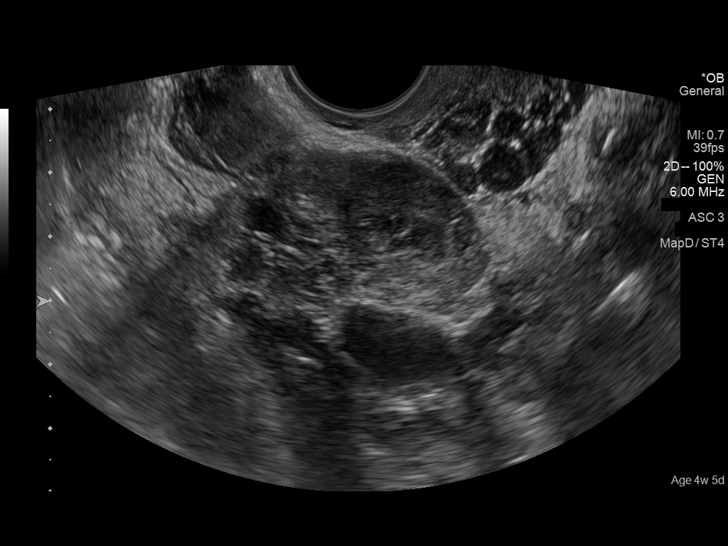
[im 56/66]
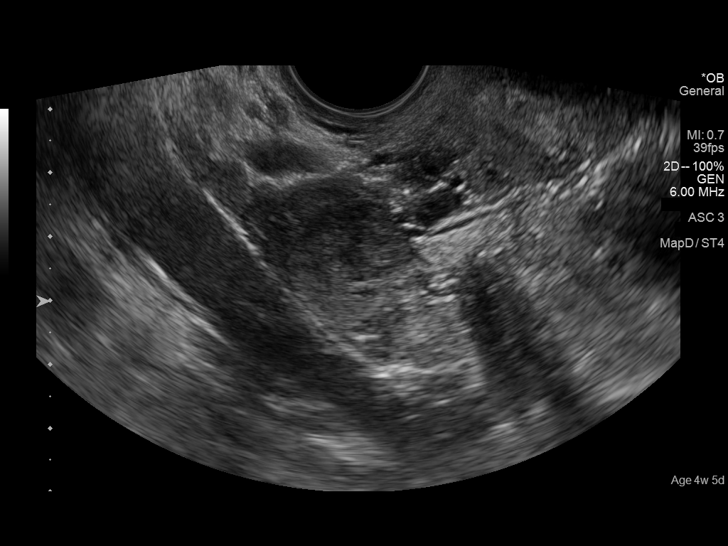
[im 61/66]
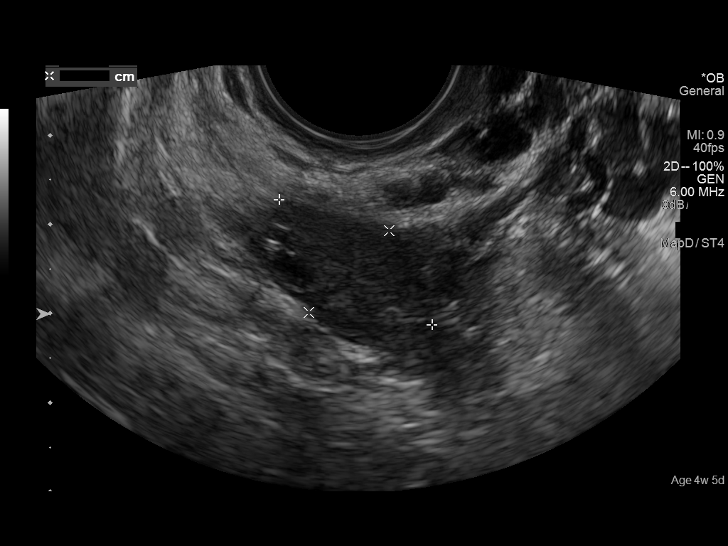
[im 66/66]
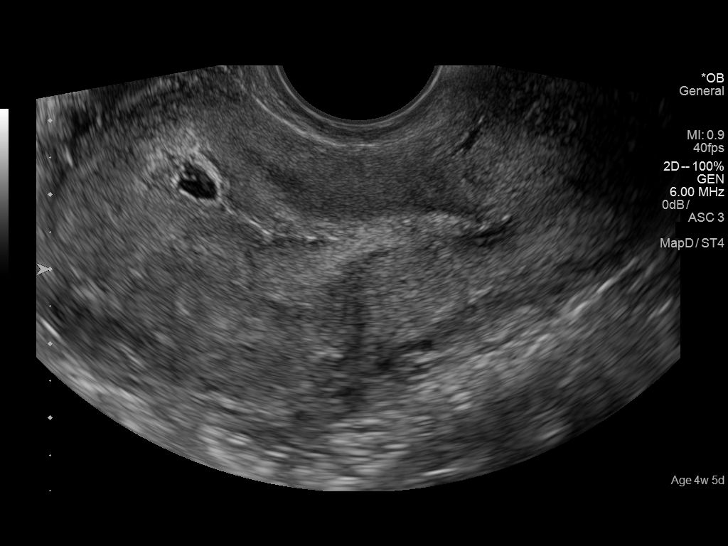

[14 of 28 positions shown; findings below may reference images not displayed]

FINDINGS: Intrauterine gestational sac: Present

Yolk sac:  Present

Embryo:  Absent

Cardiac Activity: N/A

Heart Rate: N/A  bpm

MSD: 5.2  mm   5 w   2  d

Subchorionic hemorrhage:  No subchronic hemorrhage

Maternal uterus/adnexae:

RIGHT ovary normal size and morphology, 1.9 x 2.2 x 1.3 cm.

LEFT ovary 3.3 x 2.1 x 2.1 cm and contains a small hemorrhagic
corpus luteal cyst 2.1 x 1.5 x 1.9 cm.

No additional adnexal masses or free pelvic fluid identified.
IMPRESSION: Tiny gestational sac within the uterus containing a yolk sac but no
fetal pole is visualized.

Unable to establish viability ; could establish viability by
followup ultrasound in 14 days if clinically indicated.

Small hemorrhagic corpus luteal cyst LEFT ovary.

## 2017-02-28 IMAGING — US US MISC SOFT TISSUE
1 series · 7 of 7 positions shown · non-contrast
Comparison: None.

CLINICAL DATA: Evaluate Nexplanon location prior to removal.

EXAM:
SOFT TISSUE ULTRASOUND - MISCELLANEOUS
TECHNIQUE: Ultrasound is performed of the left upper arm for evaluation of
Nexplanon location.

[Series 1: us misc soft tissue · 7 of 7 slices shown]
[im 1/7]
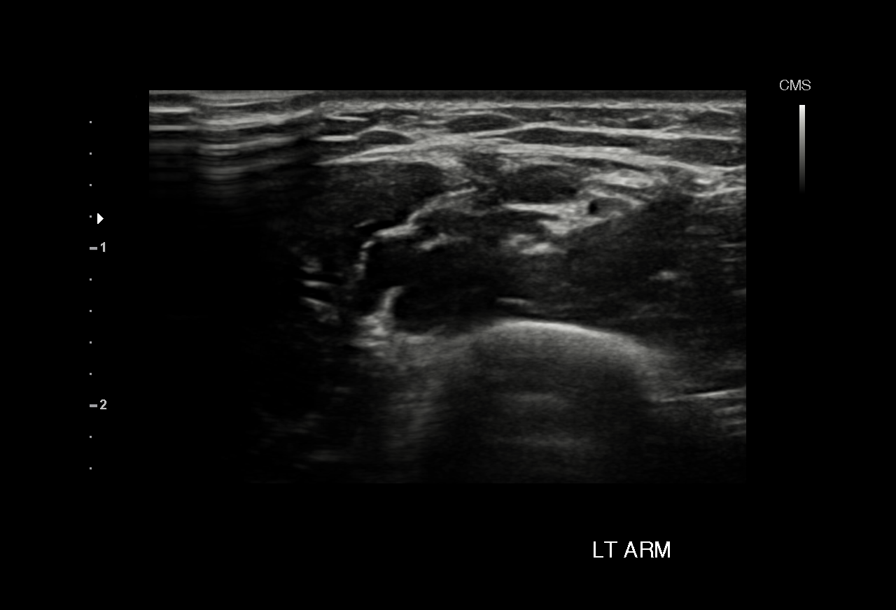
[im 2/7]
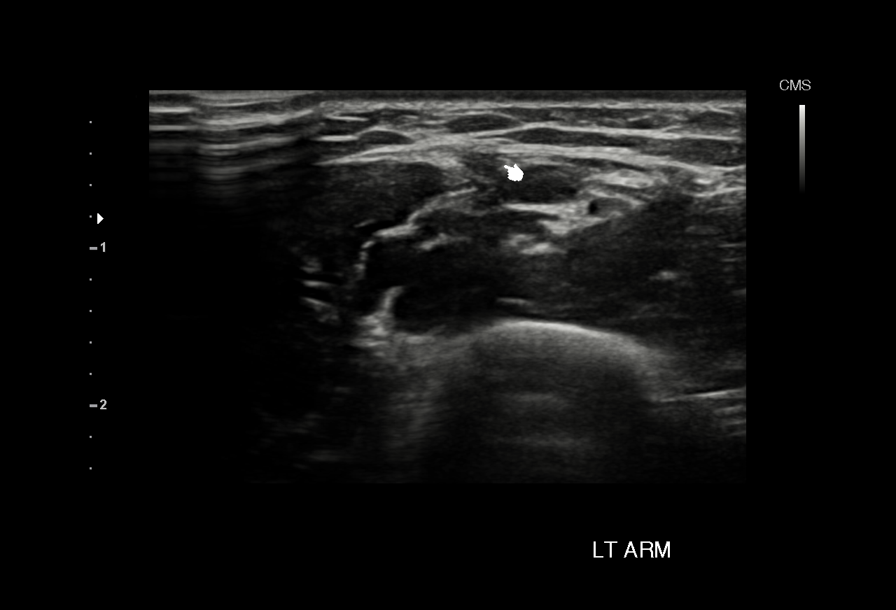
[im 3/7]
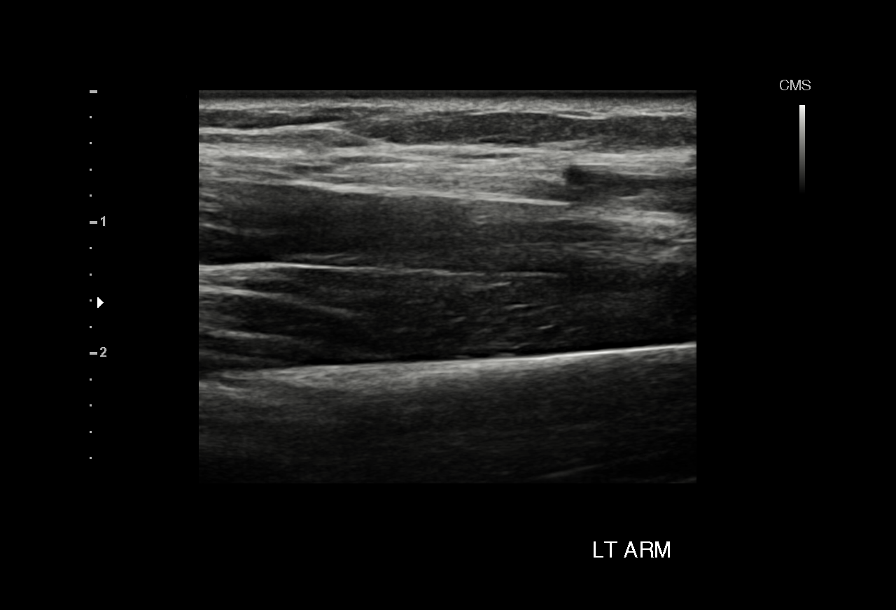
[im 4/7]
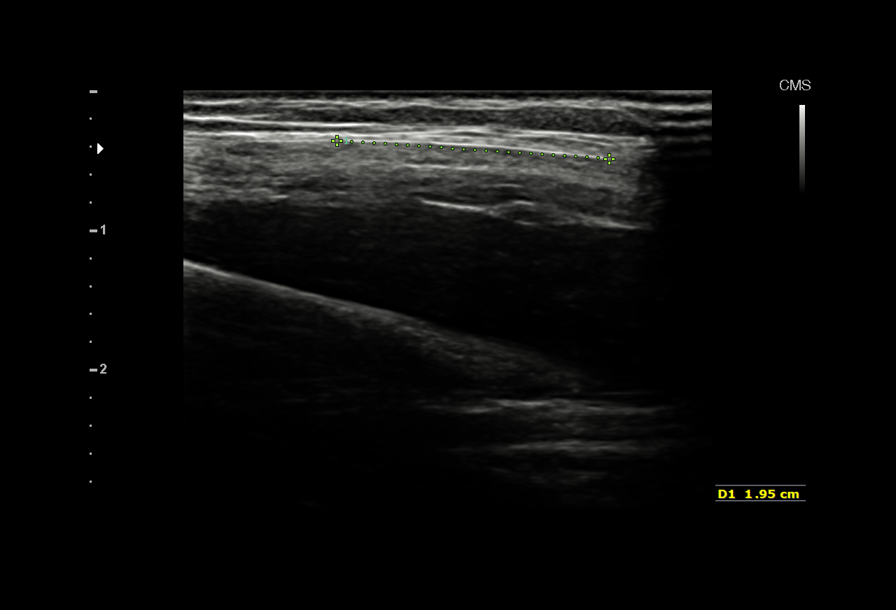
[im 5/7]
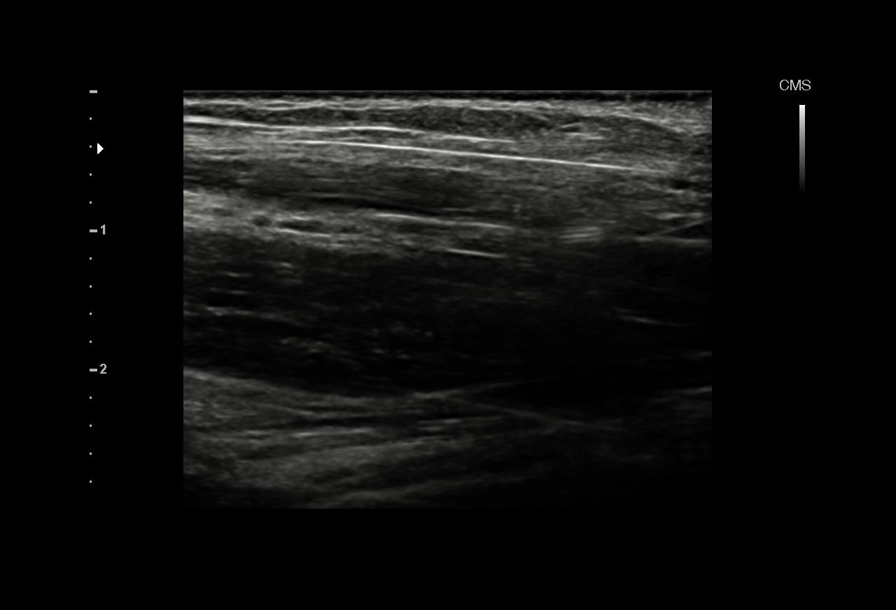
[im 6/7]
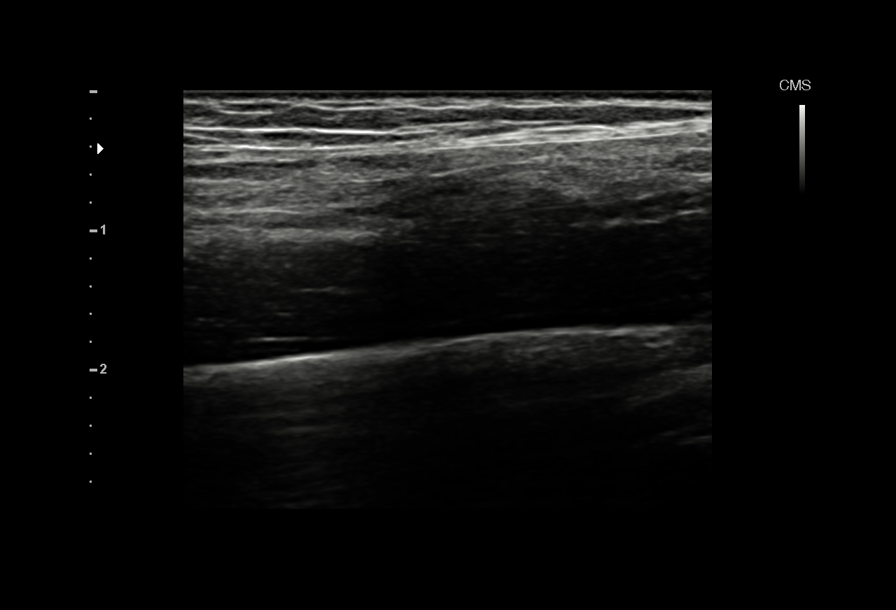
[im 7/7]
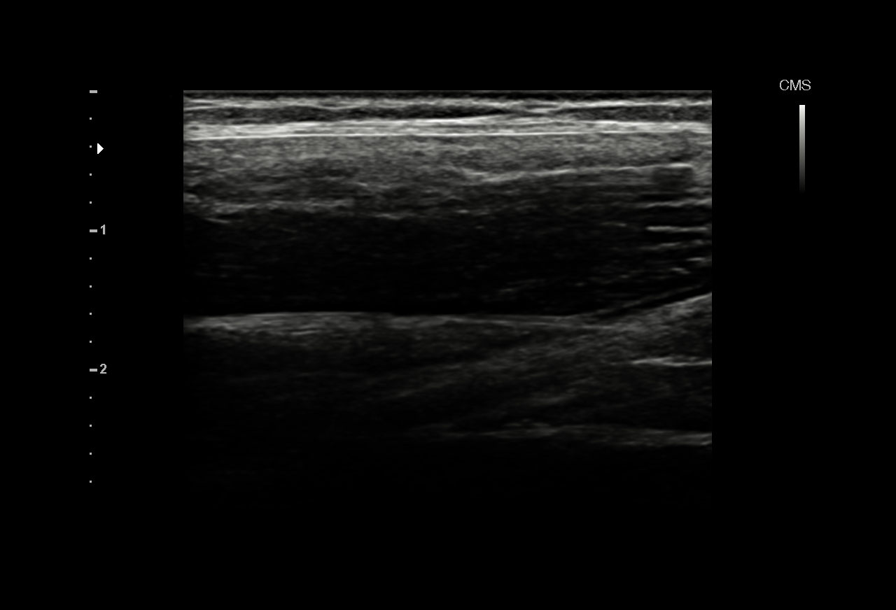

[7 of 7 positions shown; findings below may reference images not displayed]

FINDINGS: Real-time ultrasound is performed, confirming presence of neck is
plan on adjacent to the incision site in the left upper extremity.
Implant location is noted to be subcutaneous and is marked on the
skin.
IMPRESSION: Implant location marked on the skin for removal.

## 2017-04-20 ENCOUNTER — Encounter (HOSPITAL_COMMUNITY): Payer: Self-pay

## 2017-04-20 ENCOUNTER — Emergency Department (HOSPITAL_COMMUNITY)
Admission: EM | Admit: 2017-04-20 | Discharge: 2017-04-20 | Disposition: A | Discharge status: Home / Self Care | Payer: Managed Care, Other (non HMO) | Attending: Emergency Medicine | Admitting: Emergency Medicine

## 2017-04-20 DIAGNOSIS — J029 Acute pharyngitis, unspecified: Secondary | ICD-10-CM

## 2017-04-20 LAB — RAPID STREP SCREEN (MED CTR MEBANE ONLY): Streptococcus, Group A Screen (Direct): NEGATIVE

## 2017-04-20 NOTE — ED Triage Notes (Signed)
Patient complains of sore throat x 1 day. States pain with swallowing. No other associated symptoms

## 2017-04-20 NOTE — ED Provider Notes (Signed)
[facility]-EMERGENCY DEPT Provider Note   CSN: [id number] Arrival date & time: 04/20/17  1331     History   Chief Complaint Chief Complaint  Patient presents with  . Sore Throat    HPI Nicole Howard is a 26 y.o. female.  Patient is a 26 year old female who presents with a sore throat. She states it started this morning. She denies any nasal congestion. No known fevers. No difficulty swallowing. No cough or shortness of breath. She took some Tylenol earlier for the pain with some improvement in symptoms.      Past Medical History:  Diagnosis Date  . Medical history non-contributory     Patient Active Problem List   Diagnosis Date Noted  . Active labor 06/01/2012  . Normal delivery 06/01/2012    Past Surgical History:  Procedure Laterality Date  . FOREIGN BODY REMOVAL Left 09/18/2015   Procedure: FOREIGN BODY REMOVAL OF BIRTH CONTROL DEVICE IN LEFT ARM ADULT;  Surgeon: Luretha Murphy, MD;  Location: [facility];  Service: General;  Laterality: Left;  . NO PAST SURGERIES      OB History    Gravida Para Term Preterm AB Living   3 2 2     2    SAB TAB Ectopic Multiple Live Births           2       Home Medications    Prior to Admission medications   Medication Sig Start Date End Date Taking? Authorizing Provider  acetaminophen (TYLENOL) 325 MG tablet Take 650 mg by mouth every 6 (six) hours as needed for mild pain or headache.    [provider]  HYDROcodone-acetaminophen (HYCET) 7.5-325 mg/15 ml solution Take 15 mLs by mouth every 8 (eight) hours as needed for moderate pain. 01/28/17   Barrett Henle, PA-C  ibuprofen (ADVIL,MOTRIN) 600 MG tablet Take 1 tablet (600 mg total) by mouth every 6 (six) hours as needed. 01/04/17   Jacalyn Lefevre, MD  levonorgestrel (MIRENA, 52 MG,) 20 MCG/24HR IUD 1 each by Intrauterine route once.    [provider]  metroNIDAZOLE (FLAGYL) 500 MG tablet Take 1 tablet (500 mg total) by mouth 2 (two)  times daily. 01/04/17   Jacalyn Lefevre, MD    Family History Family History  Problem Relation Age of Onset  . Adopted: Yes  . Other Neg Hx     Social History Social History  Substance Use Topics  . Smoking status: Never Smoker  . Smokeless tobacco: Never Used  . Alcohol use No     Allergies   Tramadol   Review of Systems Review of Systems  Constitutional: Negative for chills, diaphoresis, fatigue and fever.  HENT: Positive for sore throat. Negative for congestion, rhinorrhea and sneezing.   Eyes: Negative.   Respiratory: Negative for cough, chest tightness and shortness of breath.   Cardiovascular: Negative for chest pain and leg swelling.  Gastrointestinal: Negative for abdominal pain, blood in stool, diarrhea, nausea and vomiting.  Genitourinary: Negative for difficulty urinating, flank pain, frequency and hematuria.  Musculoskeletal: Negative for arthralgias and back pain.  Skin: Negative for rash.  Neurological: Negative for dizziness, speech difficulty, weakness, numbness and headaches.     Physical Exam Updated Vital Signs BP (!) 142/79   Pulse 100   Temp 99 F (37.2 C) (Oral)   Resp 18   SpO2 98%   Physical Exam  Constitutional: She is oriented to person, place, and time. She appears well-developed and well-nourished.  HENT:  Head:  Normocephalic and atraumatic.  Patient has erythema posterior pharynx. Uvula is midline without peritonsillar fullness. No exudates. No ulcerated areas.  Eyes: Pupils are equal, round, and reactive to light.  Neck: Normal range of motion. Neck supple.  Cardiovascular: Normal rate, regular rhythm and normal heart sounds.   Pulmonary/Chest: Effort normal and breath sounds normal. No respiratory distress. She has no wheezes. She has no rales. She exhibits no tenderness.  Abdominal: Soft. Bowel sounds are normal. There is no tenderness. There is no rebound and no guarding.  Musculoskeletal: Normal range of motion. She exhibits no  edema.  Lymphadenopathy:    She has no cervical adenopathy.  Neurological: She is alert and oriented to person, place, and time.  Skin: Skin is warm and dry. No rash noted.  Psychiatric: She has a normal mood and affect.     ED Treatments / Results  Labs (all labs ordered are listed, but only abnormal results are displayed) Labs Reviewed  RAPID STREP SCREEN (NOT AT [facility])  CULTURE, GROUP A STREP [facility]    EKG  EKG Interpretation None       Radiology No results found.  Procedures Procedures (including critical care time)  Medications Ordered in ED Medications - No data to display   Initial Impression / Assessment and Plan / ED Course  I have reviewed the triage vital signs and the nursing notes.  Pertinent labs & imaging results that were available during my care of the patient were reviewed by me and considered in my medical decision making (see chart for details).     Patient presents with sore throat. Her strep test is negative. There is no clinical suggestions of a peritonsillar abscess. She was discharged home in good condition. Symptomatic care instructions were given. Return precautions were given.  Final Clinical Impressions(s) / ED Diagnoses   Final diagnoses:  Pharyngitis, unspecified etiology    New Prescriptions New Prescriptions   No medications on file     Rolan BuccoBelfi, Arrabella Westerman, MD 04/20/17 1446

## 2017-04-20 NOTE — ED Notes (Addendum)
Woke with sore throat this am. Tonsils red and swollen. Rapid strep collected at Triage.

## 2017-04-22 LAB — CULTURE, GROUP A STREP (THRC)

## 2017-04-26 ENCOUNTER — Encounter (HOSPITAL_COMMUNITY): Payer: Self-pay

## 2017-04-26 ENCOUNTER — Emergency Department (HOSPITAL_COMMUNITY)
Admission: EM | Admit: 2017-04-26 | Discharge: 2017-04-27 | Disposition: A | Discharge status: Home / Self Care | Payer: Managed Care, Other (non HMO) | Attending: Emergency Medicine | Admitting: Emergency Medicine

## 2017-04-26 DIAGNOSIS — Z79899 Other long term (current) drug therapy: Secondary | ICD-10-CM | POA: Insufficient documentation

## 2017-04-26 DIAGNOSIS — N72 Inflammatory disease of cervix uteri: Secondary | ICD-10-CM

## 2017-04-26 DIAGNOSIS — B9689 Other specified bacterial agents as the cause of diseases classified elsewhere: Secondary | ICD-10-CM

## 2017-04-26 DIAGNOSIS — N760 Acute vaginitis: Secondary | ICD-10-CM

## 2017-04-26 LAB — URINALYSIS, ROUTINE W REFLEX MICROSCOPIC
Bacteria, UA: NONE SEEN
Bilirubin Urine: NEGATIVE
Glucose, UA: NEGATIVE mg/dL
Ketones, ur: NEGATIVE mg/dL
Nitrite: NEGATIVE
Protein, ur: 30 mg/dL — AB
Specific Gravity, Urine: 1.014 (ref 1.005–1.030)
pH: 5 (ref 5.0–8.0)

## 2017-04-26 MED ORDER — ACETAMINOPHEN 500 MG PO TABS
1000.0000 mg | ORAL_TABLET | Freq: Once | ORAL | Status: AC
  Administered 2017-04-27: 1000 mg via ORAL
  Filled 2017-04-26: qty 2

## 2017-04-26 MED ORDER — KETOROLAC TROMETHAMINE 60 MG/2ML IM SOLN
15.0000 mg | Freq: Once | INTRAMUSCULAR | Status: AC
  Administered 2017-04-27: 15 mg via INTRAMUSCULAR
  Filled 2017-04-26: qty 2

## 2017-04-26 NOTE — ED Provider Notes (Signed)
[facility]-EMERGENCY DEPT Provider Note   CSN: [id number] Arrival date & time: 04/26/17  2131     History   Chief Complaint Chief Complaint  Patient presents with  . Abdominal Pain    HPI Nicole Howard is a 26 y.o. female.  26 yo F with a chief complaint of lower abdominal pain. The started this morning. Nothing seems to make it better or worse. Has been consistent throughout the day. Denies vomiting or diarrhea denies dysuria or increased frequency or hesitancy. Denies likelihood of being pregnant. Denies trauma. Describes the pain is sharp and crampy. Radiates to her back.   The history is provided by the patient.  Abdominal Pain   This is a new problem. The current episode started 6 to 12 hours ago. The problem occurs constantly. The problem has been gradually worsening. The pain is associated with an unknown factor. The pain is located in the suprapubic region, RLQ and LLQ. The quality of the pain is cramping, sharp and shooting. The pain is at a severity of 9/10. The pain is moderate. Pertinent negatives include fever, nausea, vomiting, dysuria, headaches, arthralgias and myalgias. Nothing aggravates the symptoms. Nothing relieves the symptoms.    Past Medical History:  Diagnosis Date  . Medical history non-contributory     Patient Active Problem List   Diagnosis Date Noted  . Active labor 06/01/2012  . Normal delivery 06/01/2012    Past Surgical History:  Procedure Laterality Date  . FOREIGN BODY REMOVAL Left 09/18/2015   Procedure: FOREIGN BODY REMOVAL OF BIRTH CONTROL DEVICE IN LEFT ARM ADULT;  Surgeon: Luretha MurphyMatthew Martin, MD;  Location: [facility];  Service: General;  Laterality: Left;  . NO PAST SURGERIES      OB History    Gravida Para Term Preterm AB Living   3 2 2     2    SAB TAB Ectopic Multiple Live Births           2       Home Medications    Prior to Admission medications   Medication Sig Start Date End Date Taking? Authorizing Provider    acetaminophen (TYLENOL) 325 MG tablet Take 650 mg by mouth every 6 (six) hours as needed for mild pain or headache.   Yes [provider]  ibuprofen (ADVIL,MOTRIN) 200 MG tablet Take 400 mg by mouth every 6 (six) hours as needed for moderate pain.   Yes [provider]  doxycycline (VIBRAMYCIN) 100 MG capsule Take 1 capsule (100 mg total) by mouth 2 (two) times daily. One po bid x 7 days 04/27/17   Melene PlanFloyd, Joevanni Roddey, DO  HYDROcodone-acetaminophen (HYCET) 7.5-325 mg/15 ml solution Take 15 mLs by mouth every 8 (eight) hours as needed for moderate pain. Patient not taking: Reported on 04/27/2017 01/28/17   Barrett HenleNadeau, Nicole Elizabeth, PA-C  ibuprofen (ADVIL,MOTRIN) 600 MG tablet Take 1 tablet (600 mg total) by mouth every 6 (six) hours as needed. Patient not taking: Reported on 04/27/2017 01/04/17   Jacalyn LefevreHaviland, Julie, MD  levonorgestrel (MIRENA, 52 MG,) 20 MCG/24HR IUD 1 each by Intrauterine route once. 07/08/16 07/08/16  [provider]  metroNIDAZOLE (FLAGYL) 500 MG tablet Take 1 tablet (500 mg total) by mouth 2 (two) times daily. One po bid x 7 days 04/27/17   Melene PlanFloyd, Lesleyann Fichter, DO    Family History Family History  Problem Relation Age of Onset  . Adopted: Yes  . Other Neg Hx     Social History Social History  Substance Use Topics  .  Smoking status: Never Smoker  . Smokeless tobacco: Never Used  . Alcohol use No     Allergies   Tramadol   Review of Systems Review of Systems  Constitutional: Negative for chills and fever.  HENT: Negative for congestion and rhinorrhea.   Eyes: Negative for redness and visual disturbance.  Respiratory: Negative for shortness of breath and wheezing.   Cardiovascular: Negative for chest pain and palpitations.  Gastrointestinal: Positive for abdominal pain. Negative for nausea and vomiting.  Genitourinary: Negative for dysuria, urgency, vaginal bleeding, vaginal discharge and vaginal pain.  Musculoskeletal: Negative for arthralgias and myalgias.   Skin: Negative for pallor and wound.  Neurological: Negative for dizziness and headaches.     Physical Exam Updated Vital Signs BP 109/74   Pulse 84   Temp 99.4 F (37.4 C) (Oral)   Resp 18   LMP 04/20/2017   SpO2 99%   Physical Exam  Constitutional: She is oriented to person, place, and time. She appears well-developed and well-nourished. No distress.  HENT:  Head: Normocephalic and atraumatic.  Eyes: Pupils are equal, round, and reactive to light. EOM are normal.  Neck: Normal range of motion. Neck supple.  Cardiovascular: Normal rate and regular rhythm.  Exam reveals no gallop and no friction rub.   No murmur heard. Pulmonary/Chest: Effort normal. She has no wheezes. She has no rales.  Abdominal: Soft. She exhibits no distension and no mass. There is tenderness (worst to the suprapubic region). There is no guarding.  Genitourinary: Cervix exhibits motion tenderness and discharge (purulent). Cervix exhibits no friability. Right adnexum displays no mass, no tenderness and no fullness. Left adnexum displays no mass, no tenderness and no fullness. Vaginal discharge found.  Musculoskeletal: She exhibits no edema or tenderness.  Neurological: She is alert and oriented to person, place, and time.  Skin: Skin is warm and dry. She is not diaphoretic.  Psychiatric: She has a normal mood and affect. Her behavior is normal.  Nursing note and vitals reviewed.    ED Treatments / Results  Labs (all labs ordered are listed, but only abnormal results are displayed) Labs Reviewed  WET PREP, GENITAL - Abnormal; Notable for the following:       Result Value   Clue Cells Wet Prep HPF POC PRESENT (*)    WBC, Wet Prep HPF POC MANY (*)    All other components within normal limits  COMPREHENSIVE METABOLIC PANEL - Abnormal; Notable for the following:    Potassium 3.4 (*)    All other components within normal limits  CBC - Abnormal; Notable for the following:    WBC 16.0 (*)    Hemoglobin  11.5 (*)    HCT 34.9 (*)    Platelets 430 (*)    All other components within normal limits  URINALYSIS, ROUTINE W REFLEX MICROSCOPIC - Abnormal; Notable for the following:    Hgb urine dipstick SMALL (*)    Protein, ur 30 (*)    Leukocytes, UA LARGE (*)    Squamous Epithelial / LPF 0-5 (*)    All other components within normal limits  LIPASE, BLOOD  RPR  HIV ANTIBODY (ROUTINE TESTING)  POC URINE PREG, ED  GC/CHLAMYDIA PROBE AMP () NOT AT [facility]    EKG  EKG Interpretation None       Radiology No results found.  Procedures Procedures (including critical care time)  Medications Ordered in ED Medications  ketorolac (TORADOL) injection 15 mg (15 mg Intramuscular Given 04/27/17 0057)  acetaminophen (TYLENOL)  tablet 1,000 mg (1,000 mg Oral Given 04/27/17 0055)  cefTRIAXone (ROCEPHIN) injection 250 mg (250 mg Intramuscular Given 04/27/17 0056)  azithromycin (ZITHROMAX) tablet 1,000 mg (1,000 mg Oral Given 04/27/17 0055)     Initial Impression / Assessment and Plan / ED Course  I have reviewed the triage vital signs and the nursing notes.  Pertinent labs & imaging results that were available during my care of the patient were reviewed by me and considered in my medical decision making (see chart for details).     25 yo F With a chief complaint of lower abdominal pain. Will obtain a pelvic exam.  Pelvic with CMT.  Will treat.     I have discussed the diagnosis/risks/treatment options with the patient and believe the pt to be eligible for discharge home to follow-up with PCP. We also discussed returning to the ED immediately if new or worsening sx occur. We discussed the sx which are most concerning (e.g., sudden worsening pain, fever, inability to tolerate by mouth) that necessitate immediate return. Medications administered to the patient during their visit and any new prescriptions provided to the patient are listed below.  Medications given during this visit Medications   ketorolac (TORADOL) injection 15 mg (15 mg Intramuscular Given 04/27/17 0057)  acetaminophen (TYLENOL) tablet 1,000 mg (1,000 mg Oral Given 04/27/17 0055)  cefTRIAXone (ROCEPHIN) injection 250 mg (250 mg Intramuscular Given 04/27/17 0056)  azithromycin (ZITHROMAX) tablet 1,000 mg (1,000 mg Oral Given 04/27/17 0055)     The patient appears reasonably screen and/or stabilized for discharge and I doubt any other medical condition or other [facility] requiring further screening, evaluation, or treatment in the ED at this time prior to discharge.    Final Clinical Impressions(s) / ED Diagnoses   Final diagnoses:  Cervicitis  BV (bacterial vaginosis)    New Prescriptions Discharge Medication List as of 04/27/2017  2:33 AM    START taking these medications   Details  doxycycline (VIBRAMYCIN) 100 MG capsule Take 1 capsule (100 mg total) by mouth 2 (two) times daily. One po bid x 7 days, Starting Sun 04/27/2017, Print         [place], [place], DO 04/27/17 [phone]

## 2017-04-27 LAB — CBC
HCT: 34.9 % — ABNORMAL LOW (ref 36.0–46.0)
Hemoglobin: 11.5 g/dL — ABNORMAL LOW (ref 12.0–15.0)
MCH: 29.4 pg (ref 26.0–34.0)
MCHC: 33 g/dL (ref 30.0–36.0)
MCV: 89.3 fL (ref 78.0–100.0)
Platelets: 430 10*3/uL — ABNORMAL HIGH (ref 150–400)
RBC: 3.91 MIL/uL (ref 3.87–5.11)
RDW: 13.3 % (ref 11.5–15.5)
WBC: 16 10*3/uL — ABNORMAL HIGH (ref 4.0–10.5)

## 2017-04-27 LAB — COMPREHENSIVE METABOLIC PANEL
ALT: 24 U/L (ref 14–54)
AST: 16 U/L (ref 15–41)
Albumin: 3.5 g/dL (ref 3.5–5.0)
Alkaline Phosphatase: 67 U/L (ref 38–126)
Anion gap: 12 (ref 5–15)
BUN: 13 mg/dL (ref 6–20)
CO2: 25 mmol/L (ref 22–32)
Calcium: 9 mg/dL (ref 8.9–10.3)
Chloride: 104 mmol/L (ref 101–111)
Creatinine, Ser: 0.75 mg/dL (ref 0.44–1.00)
GFR calc Af Amer: >60 mL/min (ref >60)
GFR calc non Af Amer: >60 mL/min (ref >60)
Glucose, Bld: 91 mg/dL (ref 65–99)
Potassium: 3.4 mmol/L — ABNORMAL LOW (ref 3.5–5.1)
Sodium: 141 mmol/L (ref 135–145)
Total Bilirubin: 0.5 mg/dL (ref 0.3–1.2)
Total Protein: 7.5 g/dL (ref 6.5–8.1)

## 2017-04-27 LAB — WET PREP, GENITAL
Sperm: NONE SEEN
Trich, Wet Prep: NONE SEEN
Yeast Wet Prep HPF POC: NONE SEEN

## 2017-04-27 LAB — HIV ANTIBODY (ROUTINE TESTING W REFLEX): HIV Screen 4th Generation wRfx: NONREACTIVE

## 2017-04-27 LAB — POC URINE PREG, ED: Preg Test, Ur: NEGATIVE

## 2017-04-27 LAB — RPR: RPR Ser Ql: NONREACTIVE

## 2017-04-27 LAB — LIPASE, BLOOD: Lipase: 25 U/L (ref 11–51)

## 2017-04-27 MED ORDER — METRONIDAZOLE 500 MG PO TABS: 500.0000 mg | ORAL_TABLET | Freq: Two times a day (BID) | ORAL | 0 refills | Status: DC

## 2017-04-27 MED ORDER — AZITHROMYCIN 250 MG PO TABS
1000.0000 mg | ORAL_TABLET | Freq: Once | ORAL | Status: AC
  Administered 2017-04-27: 1000 mg via ORAL
  Filled 2017-04-27: qty 4

## 2017-04-27 MED ORDER — CEFTRIAXONE SODIUM 250 MG IJ SOLR
250.0000 mg | Freq: Once | INTRAMUSCULAR | Status: AC
  Administered 2017-04-27: 250 mg via INTRAMUSCULAR
  Filled 2017-04-27: qty 250

## 2017-04-27 MED ORDER — DOXYCYCLINE HYCLATE 100 MG PO CAPS: 100.0000 mg | ORAL_CAPSULE | Freq: Two times a day (BID) | ORAL | 0 refills | Status: DC

## 2017-04-28 LAB — GC/CHLAMYDIA PROBE AMP (~~LOC~~) NOT AT ARMC
Chlamydia: NEGATIVE
Neisseria Gonorrhea: POSITIVE — AB

## 2017-06-24 IMAGING — MR MR HUMERUS*L* W/O CM
4 of 6 series · 25 of 40 positions shown · non-contrast
Comparison: None.

CLINICAL DATA: Patient with Implanon device in the left upper arm.
The device is to be removed. Localization study. Initial encounter.

EXAM:
MRI OF THE LEFT HUMERUS WITHOUT CONTRAST
TECHNIQUE: Multiplanar, multisequence MR imaging was performed. No intravenous
contrast was administered.

[Series 3: T1 · coronal · 4.0mm · 0.70mm/px · 4 of 20 slices shown (1 of 3)]
[im 1/20]
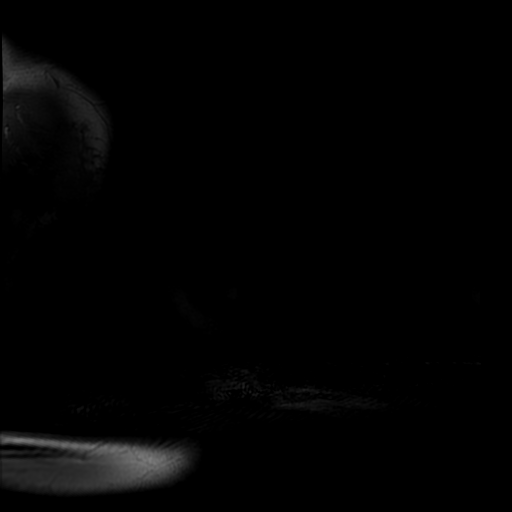
[im 7/20]
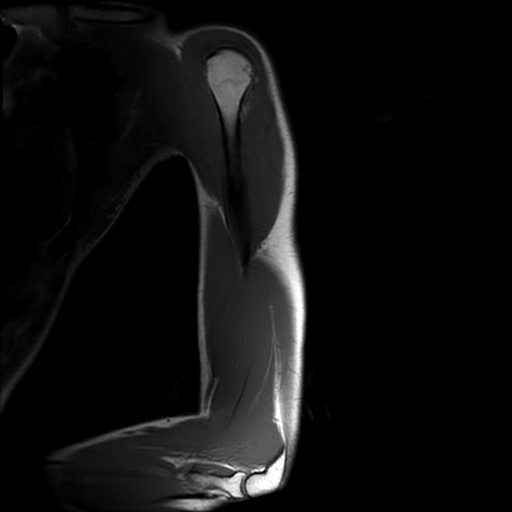
[im 13/20]
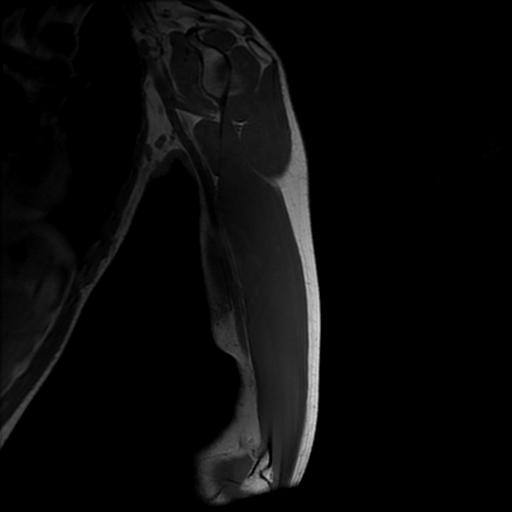
[im 20/20]
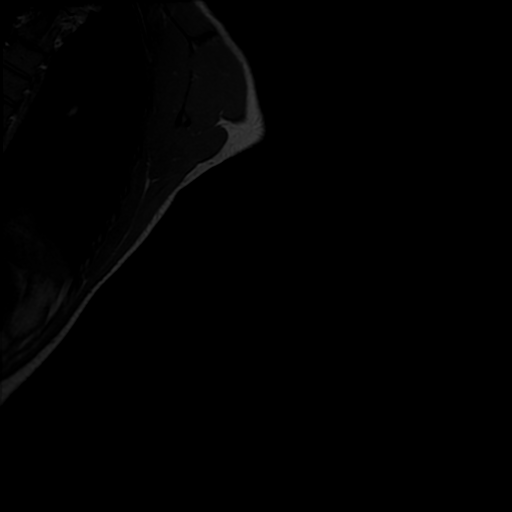

[Series 5: T1 · oblique · 4.0mm · 0.70mm/px · 4 of 22 slices shown (2 of 3)]
[im 1/22]
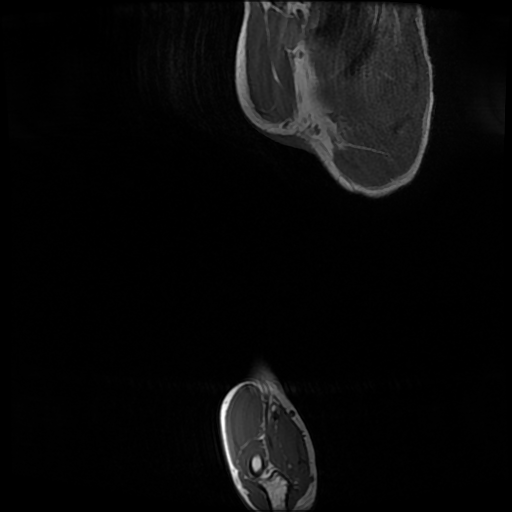
[im 8/22]
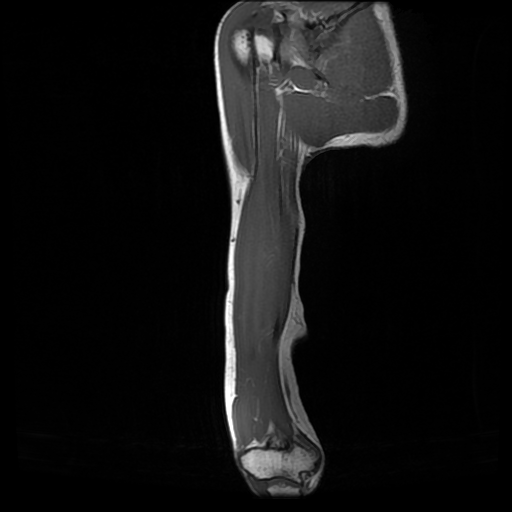
[im 15/22]
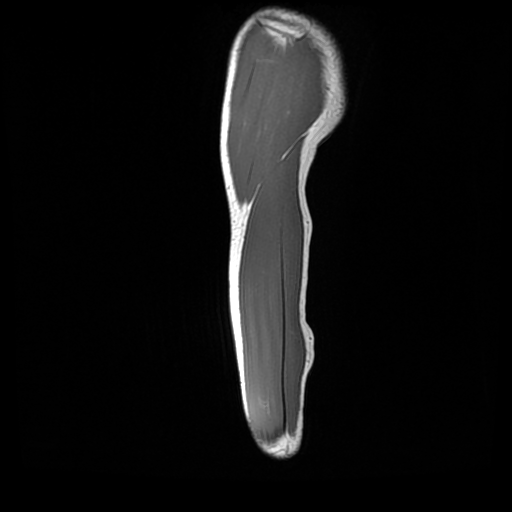
[im 22/22]
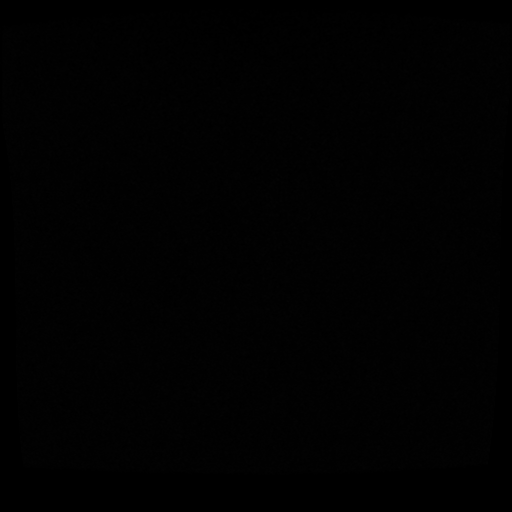

[Series 8: T2 fat-sat · oblique · 5.0mm · 0.90mm/px · 9 of 65 slices shown]
[im 1/65]
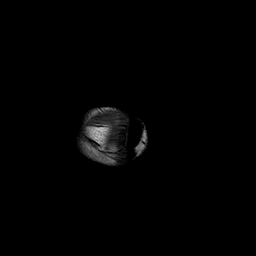
[im 12/65]
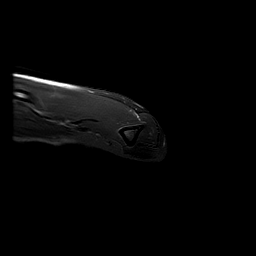
[im 18/65]
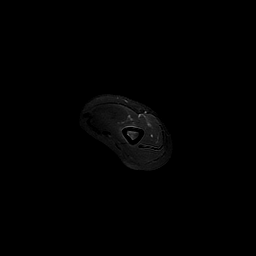
[im 30/65]
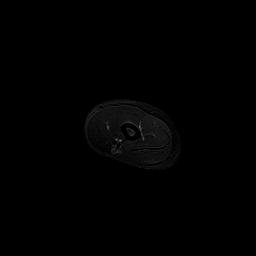
[im 35/65]
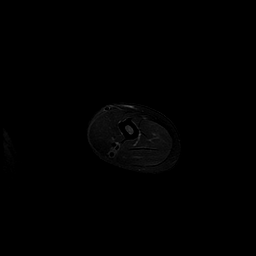
[im 47/65]
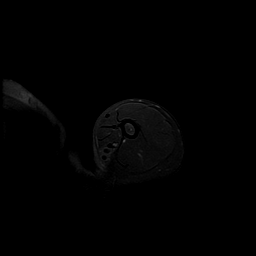
[im 53/65]
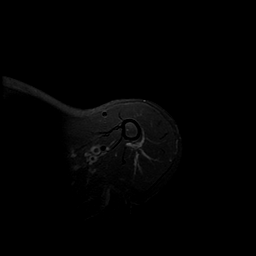
[im 59/65]
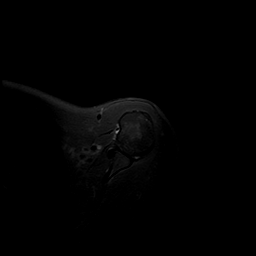
[im 65/65]
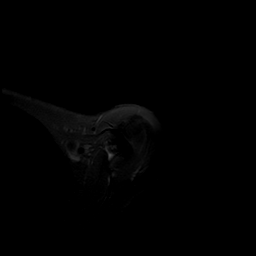

[Series 9: T1 · oblique · 5.0mm · 0.39mm/px · 8 of 65 slices shown (3 of 3)]
[im 1/65]
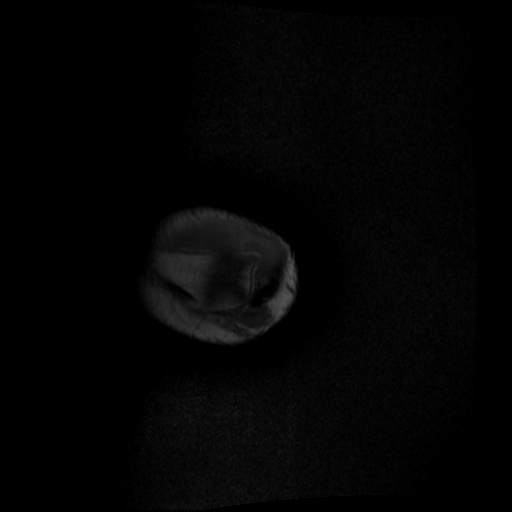
[im 12/65]
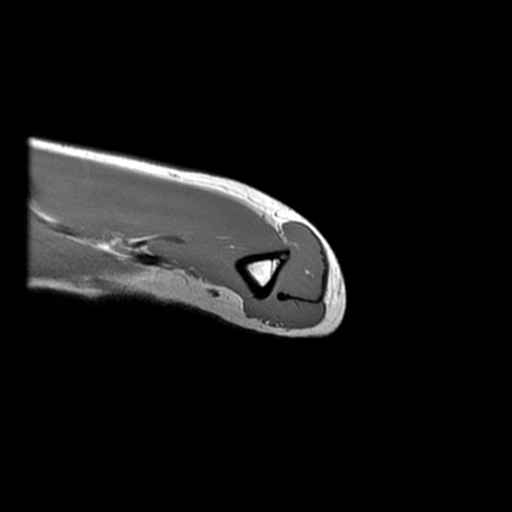
[im 18/65]
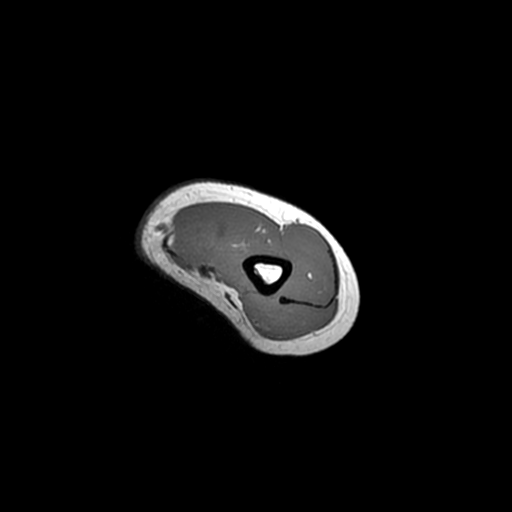
[im 30/65]
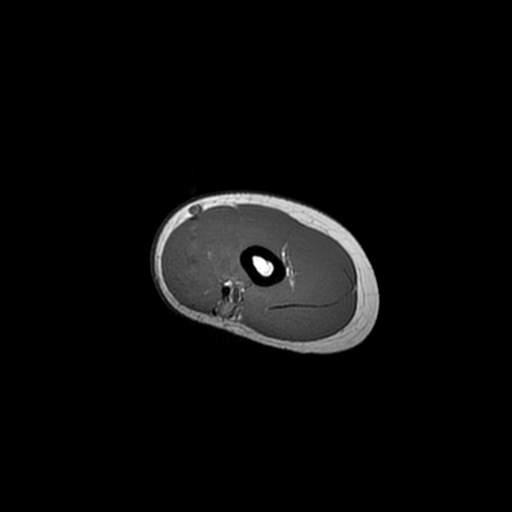
[im 35/65]
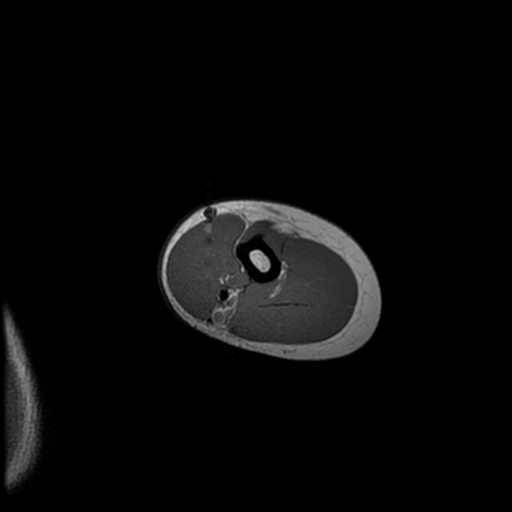
[im 47/65]
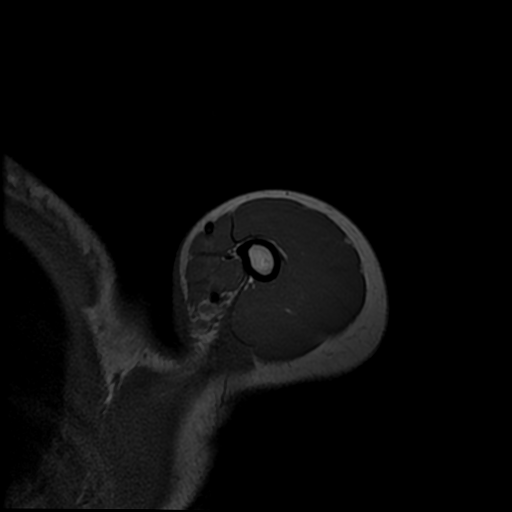
[im 53/65]
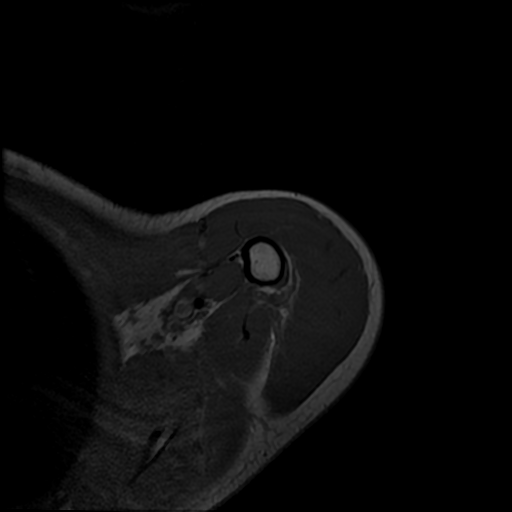
[im 59/65]
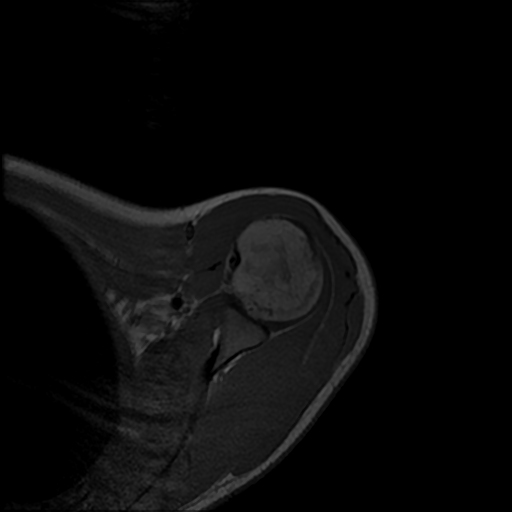

[25 of 40 positions shown; findings below may reference images not displayed]

FINDINGS: The patient's Implanon device identified in the medial soft tissues
of the upper arm of immediately adjacent to the anterior aspect of
the medial margin of the basilic vein. The device is centered 14 cm
above the medial epicondyle of the humerus. See axial images 30 to
38 of series 9 and coronal image 8 of series 6. All imaged muscles
appear intact and normal. Bone marrow signal is normal throughout.
IMPRESSION: Implanon device is centered 14 cm above the medial epicondyle of the
humerus and is immediately adjacent to the anterior aspect of the
medial margin of the basilic vein. The device contacts the wall of
the basilic vein.

## 2017-08-26 IMAGING — CR DG CHEST 2V
2 series · 2 of 2 positions shown · non-contrast
Comparison: 12/01/2013

CLINICAL DATA: Fever, sore throat starting yesterday, generalized
body aches

EXAM:
CHEST  2 VIEW

[w chest pa]
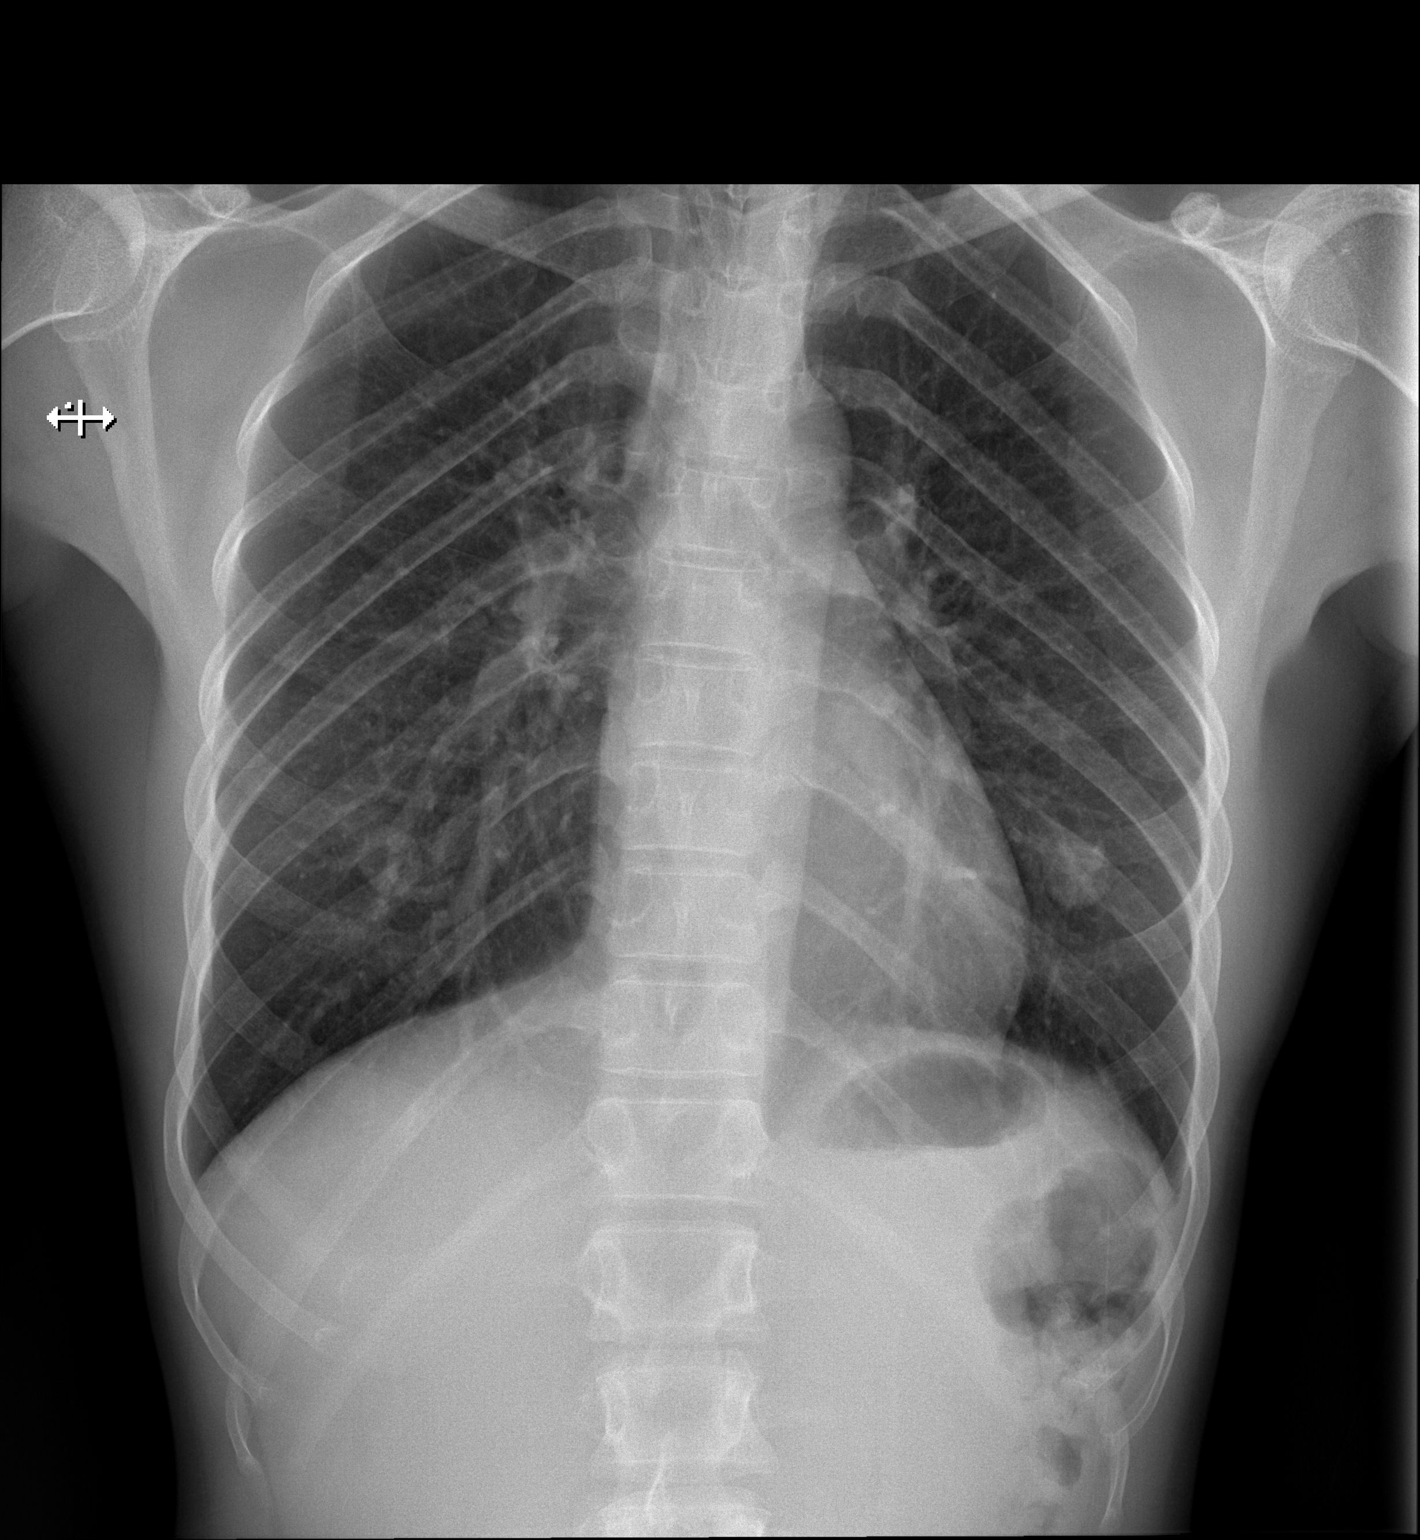

[w chest lat]
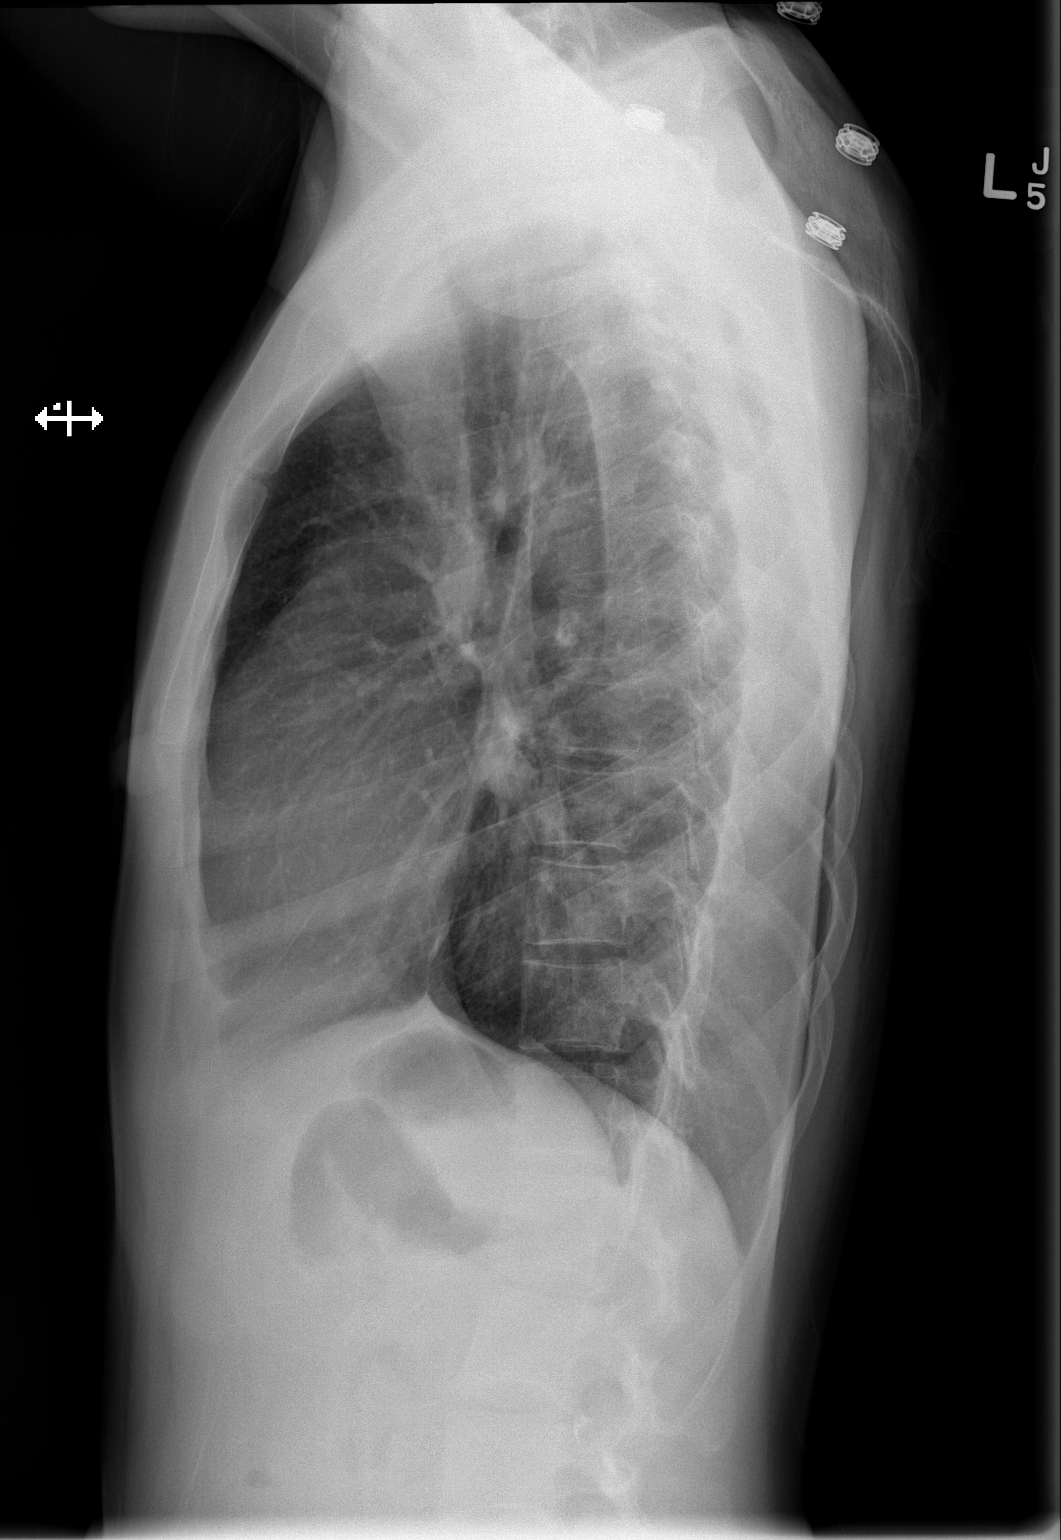

[2 of 2 positions shown; findings below may reference images not displayed]

FINDINGS: Cardiomediastinal silhouette is stable. No acute infiltrate or
pleural effusion. No pulmonary edema. Hyperinflation again noted.
Bilateral nodular nipple shadow again noted.
IMPRESSION: No active cardiopulmonary disease.

Mild hyperinflation.

## 2017-09-11 ENCOUNTER — Emergency Department (HOSPITAL_COMMUNITY)
Admission: EM | Admit: 2017-09-11 | Discharge: 2017-09-11 | Disposition: A | Discharge status: Home / Self Care | Payer: Self-pay | Attending: Emergency Medicine | Admitting: Emergency Medicine

## 2017-09-11 ENCOUNTER — Other Ambulatory Visit: Payer: Self-pay

## 2017-09-11 ENCOUNTER — Encounter (HOSPITAL_COMMUNITY): Payer: Self-pay | Admitting: *Deleted

## 2017-09-11 DIAGNOSIS — Z79899 Other long term (current) drug therapy: Secondary | ICD-10-CM | POA: Insufficient documentation

## 2017-09-11 DIAGNOSIS — M549 Dorsalgia, unspecified: Secondary | ICD-10-CM | POA: Insufficient documentation

## 2017-09-11 MED ORDER — CYCLOBENZAPRINE HCL 10 MG PO TABS: 10.0000 mg | ORAL_TABLET | Freq: Two times a day (BID) | ORAL | 0 refills | Status: DC | PRN

## 2017-09-11 NOTE — ED Provider Notes (Signed)
[facility] EMERGENCY DEPARTMENT Provider Note   CSN: [id number] Arrival date & time: 09/11/17  0907     History   Chief Complaint Chief Complaint  Patient presents with  . Back Pain    HPI Nicole Howard is a 27 y.o. female.  HPI   Nicole Howard is a 27 year old female with no significant past medical history who presents to the emergency department for evaluation of right upper back pain.  She states that this pain began yesterday, denies recent injury or heavy lifting.  Reports that her pain is constant, although worsened with movement of the neck and pressing over the back.  She states that her pain is 6/10 in severity at its worst and feels like a sharp pain when moving the neck. She has tried taking ibuprofen for her pain without significant relief.  It does not worsen when she takes a deep breath.  She denies shortness of breath, chest pain, numbness, weakness, rash, fever, chills, arthralgias or pain elsewhere.   Past Medical History:  Diagnosis Date  . Medical history non-contributory     Patient Active Problem List   Diagnosis Date Noted  . Active labor 06/01/2012  . Normal delivery 06/01/2012    Past Surgical History:  Procedure Laterality Date  . FOREIGN BODY REMOVAL Left 09/18/2015   Procedure: FOREIGN BODY REMOVAL OF BIRTH CONTROL DEVICE IN LEFT ARM ADULT;  Surgeon: Luretha Murphy, MD;  Location: [facility];  Service: General;  Laterality: Left;  . NO PAST SURGERIES      OB History    Gravida Para Term Preterm AB Living   3 2 2     2    SAB TAB Ectopic Multiple Live Births           2       Home Medications    Prior to Admission medications   Medication Sig Start Date End Date Taking? Authorizing Provider  acetaminophen (TYLENOL) 325 MG tablet Take 650 mg by mouth every 6 (six) hours as needed for mild pain or headache.    [provider]  doxycycline (VIBRAMYCIN) 100 MG capsule Take 1 capsule (100 mg total) by  mouth 2 (two) times daily. One po bid x 7 days 04/27/17   Melene Plan, DO  HYDROcodone-acetaminophen (HYCET) 7.5-325 mg/15 ml solution Take 15 mLs by mouth every 8 (eight) hours as needed for moderate pain. Patient not taking: Reported on 04/27/2017 01/28/17   Barrett Henle, PA-C  ibuprofen (ADVIL,MOTRIN) 200 MG tablet Take 400 mg by mouth every 6 (six) hours as needed for moderate pain.    [provider]  ibuprofen (ADVIL,MOTRIN) 600 MG tablet Take 1 tablet (600 mg total) by mouth every 6 (six) hours as needed. Patient not taking: Reported on 04/27/2017 01/04/17   Jacalyn Lefevre, MD  levonorgestrel (MIRENA, 52 MG,) 20 MCG/24HR IUD 1 each by Intrauterine route once. 07/08/16 07/08/16  [provider]  metroNIDAZOLE (FLAGYL) 500 MG tablet Take 1 tablet (500 mg total) by mouth 2 (two) times daily. One po bid x 7 days 04/27/17   Melene Plan, DO    Family History Family History  Adopted: Yes  Problem Relation Age of Onset  . Other Neg Hx     Social History Social History   Tobacco Use  . Smoking status: Never Smoker  . Smokeless tobacco: Never Used  Substance Use Topics  . Alcohol use: No  . Drug use: No     Allergies  Tramadol   Review of Systems Review of Systems  Constitutional: Negative for chills, fatigue and fever.  Respiratory: Negative for cough and shortness of breath.   Cardiovascular: Negative for chest pain.  Gastrointestinal: Negative for abdominal pain, nausea and vomiting.  Musculoskeletal: Positive for myalgias (right upper back). Negative for arthralgias and neck pain.  Skin: Negative for rash.  Neurological: Negative for weakness and numbness.     Physical Exam Updated Vital Signs BP 127/75 (BP Location: Left Arm)   Pulse 72   Temp 98.6 F (37 C) (Oral)   Resp 17   Ht 5\' 4"  (1.626 m)   Wt 47.6 kg (105 lb)   LMP 09/08/2017 (Exact Date)   SpO2 100%   BMI 18.02 kg/m   Physical Exam  Constitutional: She appears well-developed  and well-nourished. No distress.  HENT:  Head: Normocephalic and atraumatic.  Eyes: Right eye exhibits no discharge. Left eye exhibits no discharge.  Neck: Normal range of motion. Neck supple.  No midline cervical spine tenderness  Cardiovascular: Normal rate, regular rhythm and intact distal pulses. Exam reveals no friction rub.  No murmur heard. Pulmonary/Chest: Effort normal and breath sounds normal. No stridor. No respiratory distress. She has no wheezes. She has no rales.  Abdominal: Soft. Bowel sounds are normal. There is no tenderness. There is no guarding.  Musculoskeletal:  Tenderness to palpation grossly over the right scapula. No midline T-spine or L-spine tenderness. No overlying rash, ecchymosis, erythema or break in skin.  Full active range of motion of this shoulder without pain.  Strength 5/5 in bilateral upper extremities.  Sensation to light touch intact in bilateral upper extremities.  Radial pulses 2+ bilaterally.  Capillary refill less than 2 seconds in bilateral UE.  Lymphadenopathy:    She has no cervical adenopathy.  Neurological: She is alert. Coordination normal.  Skin: Skin is warm and dry. Capillary refill takes less than 2 seconds. She is not diaphoretic.  Psychiatric: She has a normal mood and affect. Her behavior is normal.  Nursing note and vitals reviewed.    ED Treatments / Results  Labs (all labs ordered are listed, but only abnormal results are displayed) Labs Reviewed - No data to display  EKG  EKG Interpretation None       Radiology No results found.  Procedures Procedures (including critical care time)  Medications Ordered in ED Medications - No data to display   Initial Impression / Assessment and Plan / ED Course  I have reviewed the triage vital signs and the nursing notes.  Pertinent labs & imaging results that were available during my care of the patient were reviewed by me and considered in my medical decision making (see  chart for details).    Presents with right upper back pain.  Vital signs stable and she is in no acute distress.  No recent injury.  No associated shortness of breath, chest pain or arm pain.  No neurological symptoms. Given that pain is worsened with palpation over the back and with neck movement, suspect this is musculoskeletal in nature.  Offered to get an x-ray of the shoulder for further evaluation, patient declines.  Will discharge with muscle relaxer and counseled patient on NSAID use.  Discussed return precautions.  Patient agrees and voiced understanding the above plan.  Final Clinical Impressions(s) / ED Diagnoses   Final diagnoses:  Upper back pain on right side    ED Discharge Orders        Ordered  cyclobenzaprine (FLEXERIL) 10 MG tablet  2 times daily PRN     09/11/17 1031       Lawrence MarseillesShrosbree, Joselynne Killam J, PA-C 09/11/17 1033    Charlynne PanderYao, David Hsienta, MD 09/11/17 [phone]

## 2017-09-11 NOTE — ED Triage Notes (Signed)
C/o upper back pain onset yest , denies injury states it hurts worse if she turns her neck to the right

## 2017-09-11 NOTE — Discharge Instructions (Signed)
Please take 800 mg ibuprofen every 6 hours as needed for pain.  I have also written her prescription for muscle relaxer medicine called Flexeril.  This medicine can make you drowsy so please do not drive, work or drink alcohol while taking it.  You can also apply heat to the back to help with your symptoms.  I have written you a work note.  Please schedule an appointment with your primary doctor if your symptoms are not improving in a week.  Return to the emergency department if you develop chest pain, shortness of breath or have any new or worsening symptoms.

## 2017-09-11 NOTE — ED Notes (Signed)
Pt verbalized understanding discharge instructions and denies any further needs or questions at this time. VS stable, ambulatory and steady gait.   

## 2017-10-14 ENCOUNTER — Encounter (HOSPITAL_COMMUNITY): Payer: Self-pay | Admitting: *Deleted

## 2017-10-14 ENCOUNTER — Emergency Department (HOSPITAL_COMMUNITY)
Admission: EM | Admit: 2017-10-14 | Discharge: 2017-10-14 | Disposition: A | Discharge status: Home / Self Care | Payer: Self-pay | Attending: Emergency Medicine | Admitting: Emergency Medicine

## 2017-10-14 ENCOUNTER — Other Ambulatory Visit: Payer: Self-pay

## 2017-10-14 DIAGNOSIS — Z5321 Procedure and treatment not carried out due to patient leaving prior to being seen by health care provider: Secondary | ICD-10-CM | POA: Insufficient documentation

## 2017-10-14 DIAGNOSIS — R109 Unspecified abdominal pain: Secondary | ICD-10-CM | POA: Insufficient documentation

## 2017-10-14 LAB — LIPASE, BLOOD: Lipase: 33 U/L (ref 11–51)

## 2017-10-14 LAB — COMPREHENSIVE METABOLIC PANEL
ALT: 15 U/L (ref 14–54)
AST: 17 U/L (ref 15–41)
Albumin: 3.8 g/dL (ref 3.5–5.0)
Alkaline Phosphatase: 56 U/L (ref 38–126)
Anion gap: 10 (ref 5–15)
BUN: 11 mg/dL (ref 6–20)
CO2: 23 mmol/L (ref 22–32)
Calcium: 8.9 mg/dL (ref 8.9–10.3)
Chloride: 107 mmol/L (ref 101–111)
Creatinine, Ser: 0.73 mg/dL (ref 0.44–1.00)
GFR calc Af Amer: >60 mL/min (ref >60)
GFR calc non Af Amer: >60 mL/min (ref >60)
Glucose, Bld: 86 mg/dL (ref 65–99)
Potassium: 3.5 mmol/L (ref 3.5–5.1)
Sodium: 140 mmol/L (ref 135–145)
Total Bilirubin: 0.6 mg/dL (ref 0.3–1.2)
Total Protein: 7.2 g/dL (ref 6.5–8.1)

## 2017-10-14 LAB — CBC
HCT: 37 % (ref 36.0–46.0)
Hemoglobin: 12.2 g/dL (ref 12.0–15.0)
MCH: 30 pg (ref 26.0–34.0)
MCHC: 33 g/dL (ref 30.0–36.0)
MCV: 90.9 fL (ref 78.0–100.0)
Platelets: 261 10*3/uL (ref 150–400)
RBC: 4.07 MIL/uL (ref 3.87–5.11)
RDW: 12.8 % (ref 11.5–15.5)
WBC: 7.4 10*3/uL (ref 4.0–10.5)

## 2017-10-14 LAB — I-STAT BETA HCG BLOOD, ED (MC, WL, AP ONLY): I-stat hCG, quantitative: <5 m[IU]/mL (ref <5)

## 2017-10-14 NOTE — ED Notes (Signed)
Called again for room x 3. No answer 

## 2017-10-14 NOTE — ED Notes (Signed)
Called for room x3. No answer. 

## 2017-10-14 NOTE — ED Triage Notes (Signed)
Pt reports abdominal pain for 2 days. Denies N/V

## 2017-10-15 ENCOUNTER — Emergency Department (HOSPITAL_COMMUNITY)
Admission: EM | Admit: 2017-10-15 | Discharge: 2017-10-15 | Disposition: A | Discharge status: Home / Self Care | Payer: Self-pay | Attending: Emergency Medicine | Admitting: Emergency Medicine

## 2017-10-15 ENCOUNTER — Encounter (HOSPITAL_COMMUNITY): Payer: Self-pay | Admitting: Emergency Medicine

## 2017-10-15 DIAGNOSIS — Z79899 Other long term (current) drug therapy: Secondary | ICD-10-CM | POA: Insufficient documentation

## 2017-10-15 DIAGNOSIS — N760 Acute vaginitis: Secondary | ICD-10-CM | POA: Insufficient documentation

## 2017-10-15 DIAGNOSIS — R109 Unspecified abdominal pain: Secondary | ICD-10-CM

## 2017-10-15 DIAGNOSIS — B9689 Other specified bacterial agents as the cause of diseases classified elsewhere: Secondary | ICD-10-CM

## 2017-10-15 LAB — COMPREHENSIVE METABOLIC PANEL
ALT: 16 U/L (ref 14–54)
AST: 16 U/L (ref 15–41)
Albumin: 4.1 g/dL (ref 3.5–5.0)
Alkaline Phosphatase: 62 U/L (ref 38–126)
Anion gap: 6 (ref 5–15)
BUN: 11 mg/dL (ref 6–20)
CO2: 25 mmol/L (ref 22–32)
Calcium: 8.8 mg/dL — ABNORMAL LOW (ref 8.9–10.3)
Chloride: 105 mmol/L (ref 101–111)
Creatinine, Ser: 0.61 mg/dL (ref 0.44–1.00)
GFR calc Af Amer: >60 mL/min (ref >60)
GFR calc non Af Amer: >60 mL/min (ref >60)
Glucose, Bld: 79 mg/dL (ref 65–99)
Potassium: 4.3 mmol/L (ref 3.5–5.1)
Sodium: 136 mmol/L (ref 135–145)
Total Bilirubin: 0.9 mg/dL (ref 0.3–1.2)
Total Protein: 7.4 g/dL (ref 6.5–8.1)

## 2017-10-15 LAB — URINALYSIS, ROUTINE W REFLEX MICROSCOPIC
Bilirubin Urine: NEGATIVE
Glucose, UA: NEGATIVE mg/dL
Hgb urine dipstick: NEGATIVE
Ketones, ur: NEGATIVE mg/dL
Leukocytes, UA: NEGATIVE
Nitrite: NEGATIVE
Protein, ur: NEGATIVE mg/dL
Specific Gravity, Urine: 1.02 (ref 1.005–1.030)
pH: 8 (ref 5.0–8.0)

## 2017-10-15 LAB — CBC
HCT: 38.3 % (ref 36.0–46.0)
Hemoglobin: 12.4 g/dL (ref 12.0–15.0)
MCH: 29.7 pg (ref 26.0–34.0)
MCHC: 32.4 g/dL (ref 30.0–36.0)
MCV: 91.6 fL (ref 78.0–100.0)
Platelets: 272 10*3/uL (ref 150–400)
RBC: 4.18 MIL/uL (ref 3.87–5.11)
RDW: 12.8 % (ref 11.5–15.5)
WBC: 8.4 10*3/uL (ref 4.0–10.5)

## 2017-10-15 LAB — WET PREP, GENITAL
Sperm: NONE SEEN
Trich, Wet Prep: NONE SEEN
Yeast Wet Prep HPF POC: NONE SEEN

## 2017-10-15 LAB — I-STAT BETA HCG BLOOD, ED (MC, WL, AP ONLY): I-stat hCG, quantitative: <5 m[IU]/mL (ref <5)

## 2017-10-15 LAB — LIPASE, BLOOD: Lipase: 31 U/L (ref 11–51)

## 2017-10-15 MED ORDER — STERILE WATER FOR INJECTION IJ SOLN
INTRAMUSCULAR | Status: AC
  Administered 2017-10-15: 1.9 mL
  Filled 2017-10-15: qty 10

## 2017-10-15 MED ORDER — CEFTRIAXONE SODIUM 250 MG IJ SOLR
250.0000 mg | Freq: Once | INTRAMUSCULAR | Status: AC
  Administered 2017-10-15: 250 mg via INTRAMUSCULAR
  Filled 2017-10-15: qty 250

## 2017-10-15 MED ORDER — NAPROXEN 500 MG PO TABS: 500.0000 mg | ORAL_TABLET | Freq: Two times a day (BID) | ORAL | 0 refills | Status: DC | PRN

## 2017-10-15 MED ORDER — NAPROXEN 500 MG PO TABS
500.0000 mg | ORAL_TABLET | Freq: Once | ORAL | Status: AC
  Administered 2017-10-15: 500 mg via ORAL
  Filled 2017-10-15: qty 1

## 2017-10-15 MED ORDER — DOXYCYCLINE HYCLATE 100 MG PO CAPS: 100.0000 mg | ORAL_CAPSULE | Freq: Two times a day (BID) | ORAL | 0 refills | Status: DC

## 2017-10-15 MED ORDER — METRONIDAZOLE 500 MG PO TABS: 500.0000 mg | ORAL_TABLET | Freq: Two times a day (BID) | ORAL | 0 refills | Status: DC

## 2017-10-15 MED ORDER — AZITHROMYCIN 250 MG PO TABS
1000.0000 mg | ORAL_TABLET | Freq: Once | ORAL | Status: AC
  Administered 2017-10-15: 1000 mg via ORAL
  Filled 2017-10-15: qty 4

## 2017-10-15 NOTE — Discharge Instructions (Addendum)
You were seen in the emergency department today for abdominal pain and vaginal discharge.  Your lab work obtained during your pelvic exam showed findings consistent with bacterial vaginosis.  Your lab work was otherwise normal, there were no signs of anemia, significant electrolyte abnormalities, or problems with your kidneys, liver, or pancreas.  Your urinalysis did not show signs of a urinary tract infection.  You were treated prophylactically for gonorrhea and chlamydia in the emergency department with ceftriaxone and azithromycin, these are antibiotics.  We will call you with the results of your gonorrhea and Chlamydia testing, if these are positive you will need to inform all sexual partners so that they may seek evaluation.  We have given you 2 prescriptions today: -Metronidazole-this is an antibiotic you will need to take twice per day to treat your bacterial vaginosis.  - Doxycycline- this is an antibiotic you will need to take twice per day  Please take all of your antibiotics until finished. You may develop abdominal discomfort or diarrhea from the antibiotic.  You may help offset this with probiotics which you can buy at the store (ask your pharmacist if unable to find) or get probiotics in the form of eating yogurt. Do not eat or take the probiotics until 2 hours after your antibiotic. If you are unable to tolerate these side effects follow-up with your primary care provider or return to the emergency department. If you begin to experience any blistering, rashes, swelling, or difficulty breathing seek medical care for evaluation of potentially more serious side effects.  - Naproxen- Naproxen is a nonsteroidal anti-inflammatory medication that will help with pain. Be sure to take this medication as prescribed with food, 1 pill every 12 hours,  It should be taken with food, as it can cause stomach upset, and more seriously, stomach bleeding. Do not take other nonsteroidal anti-inflammatory  medications with this such as Advil, Motrin, or Aleve. You may supplement with Tylenol.   Please be aware that these medications may interact with other medications you are taking, please be sure to discuss your medication list with your pharmacist.  Follow-up with the women's health clinic, whose information is provided in your discharge instructions, within the next 5 days for reevaluation of your symptoms and further management.  Return to the emergency department for any new or worsening symptoms including but not limited to worsening of your pain, fever, blood in your stool, inability to keep down fluids due to vomiting, increase in your vaginal discharge as opposed to improvement, or any other concerns that you may have.

## 2017-10-15 NOTE — ED Provider Notes (Signed)
[facility]-EMERGENCY DEPT Provider Note   CSN: [id number] Arrival date & time: 10/15/17  0800     History   Chief Complaint Chief Complaint  Patient presents with  . Abdominal Pain    HPI Nicole Howard is a 27 y.o. female without significant past medical history who presents the emergency department complaining of intermittent abdominal pain over the past 3 days.  Patient states that she is having pain to the right lower abdomen and suprapubic region occurring once every few hours lasting for about an hour in duration.  Describes the pain as a crampy feeling, "almost like period cramps, but a little different."  Pain has no specific alleviating or aggravating factors or triggers.  Patient's pain at present is a 5 out of 10 in severity.  Reports associated vaginal discharge which is increased from normal and has a foul odor to it.  States that her symptoms are very similar to previous bacterial vaginosis diagnosis.  Denies fever, chills, nausea, vomiting, diarrhea, constipation, blood in stool, or urinary symptoms. Patient states she is currently menstruating, this started last evening. States she is sexually active in a monogamous relationship, no recent new sexual partners.   HPI  Past Medical History:  Diagnosis Date  . Medical history non-contributory     Patient Active Problem List   Diagnosis Date Noted  . Active labor 06/01/2012  . Normal delivery 06/01/2012    Past Surgical History:  Procedure Laterality Date  . FOREIGN BODY REMOVAL Left 09/18/2015   Procedure: FOREIGN BODY REMOVAL OF BIRTH CONTROL DEVICE IN LEFT ARM ADULT;  Surgeon: Luretha MurphyMatthew Martin, MD;  Location: [facility];  Service: General;  Laterality: Left;  . NO PAST SURGERIES      OB History    Gravida Para Term Preterm AB Living   3 2 2     2    SAB TAB Ectopic Multiple Live Births           2       Home Medications    Prior to Admission medications   Medication  Sig Start Date End Date Taking? Authorizing Provider  ibuprofen (ADVIL,MOTRIN) 200 MG tablet Take 800 mg by mouth daily as needed for moderate pain.    Yes [provider]  levonorgestrel (MIRENA, 52 MG,) 20 MCG/24HR IUD 1 each by Intrauterine route once. 07/08/16 10/15/17 Yes [provider]  cyclobenzaprine (FLEXERIL) 10 MG tablet Take 1 tablet (10 mg total) by mouth 2 (two) times daily as needed for muscle spasms. Patient not taking: Reported on 10/15/2017 09/11/17   Kellie ShropshireShrosbree, Emily J, PA-C  doxycycline (VIBRAMYCIN) 100 MG capsule Take 1 capsule (100 mg total) by mouth 2 (two) times daily. One po bid x 7 days Patient not taking: Reported on 10/15/2017 04/27/17   Melene PlanFloyd, Dan, DO  HYDROcodone-acetaminophen (HYCET) 7.5-325 mg/15 ml solution Take 15 mLs by mouth every 8 (eight) hours as needed for moderate pain. Patient not taking: Reported on 10/15/2017 01/28/17   Barrett HenleNadeau, Nicole Elizabeth, PA-C  ibuprofen (ADVIL,MOTRIN) 600 MG tablet Take 1 tablet (600 mg total) by mouth every 6 (six) hours as needed. Patient not taking: Reported on 10/15/2017 01/04/17   Jacalyn LefevreHaviland, Julie, MD  metroNIDAZOLE (FLAGYL) 500 MG tablet Take 1 tablet (500 mg total) by mouth 2 (two) times daily. One po bid x 7 days Patient not taking: Reported on 10/15/2017 04/27/17   Melene PlanFloyd, Dan, DO    Family History Family History  Adopted: Yes  Problem Relation Age of  Onset  . Other Neg Hx     Social History Social History   Tobacco Use  . Smoking status: Never Smoker  . Smokeless tobacco: Never Used  Substance Use Topics  . Alcohol use: No  . Drug use: No     Allergies   Tramadol   Review of Systems Review of Systems  Constitutional: Negative for chills and fever.  Respiratory: Negative for shortness of breath.   Cardiovascular: Negative for chest pain.  Gastrointestinal: Positive for abdominal pain. Negative for blood in stool, constipation, diarrhea and nausea.  Genitourinary: Positive for vaginal bleeding  (with mensturation) and vaginal discharge. Negative for dysuria, frequency, hematuria and urgency.  All other systems reviewed and are negative.    Physical Exam Updated Vital Signs BP 126/75 (BP Location: Right Arm)   Pulse 65   Temp 98 F (36.7 C) (Oral)   Resp 15   LMP 10/08/2017   SpO2 100%   Physical Exam  Constitutional: She appears well-developed and well-nourished. No distress.  HENT:  Head: Normocephalic and atraumatic.  Eyes: Conjunctivae are normal. Right eye exhibits no discharge. Left eye exhibits no discharge.  Cardiovascular: Normal rate and regular rhythm.  No murmur heard. Pulmonary/Chest: Breath sounds normal. No respiratory distress. She has no wheezes. She has no rales.  Abdominal: Soft. Normal appearance and bowel sounds are normal. She exhibits no distension. There is tenderness (mild) in the right lower quadrant and suprapubic area. There is no rigidity, no rebound, no guarding, no CVA tenderness and no tenderness at McBurney's point.  Negative psoas and obturator sign. Negative Rovsings.   Genitourinary: Pelvic exam was performed with patient supine. There is no lesion on the right labia. There is no lesion on the left labia. Cervix exhibits discharge (copious amounts of thin yellow mucous like discharge to cervix and vagina vault). Cervix exhibits no motion tenderness and no friability. Right adnexum displays tenderness (mild). Right adnexum displays no mass. Left adnexum displays no mass and no tenderness. Vaginal discharge found.  Genitourinary Comments: Marchelle Folks EDT present as chaperone. No chandelier sign.   Neurological: She is alert.  Clear speech.   Skin: Skin is warm and dry. No rash noted.  Psychiatric: She has a normal mood and affect. Her behavior is normal.  Nursing note and vitals reviewed.   ED Treatments / Results  Labs Results for orders placed or performed during the hospital encounter of 10/15/17  Wet prep, genital  Result Value Ref Range    Yeast Wet Prep HPF POC NONE SEEN NONE SEEN   Trich, Wet Prep NONE SEEN NONE SEEN   Clue Cells Wet Prep HPF POC PRESENT (A) NONE SEEN   WBC, Wet Prep HPF POC MANY (A) NONE SEEN   Sperm NONE SEEN   Lipase, blood  Result Value Ref Range   Lipase 31 11 - 51 U/L  Comprehensive metabolic panel  Result Value Ref Range   Sodium 136 135 - 145 mmol/L   Potassium 4.3 3.5 - 5.1 mmol/L   Chloride 105 101 - 111 mmol/L   CO2 25 22 - 32 mmol/L   Glucose, Bld 79 65 - 99 mg/dL   BUN 11 6 - 20 mg/dL   Creatinine, Ser 5.28 0.44 - 1.00 mg/dL   Calcium 8.8 (L) 8.9 - 10.3 mg/dL   Total Protein 7.4 6.5 - 8.1 g/dL   Albumin 4.1 3.5 - 5.0 g/dL   AST 16 15 - 41 U/L   ALT 16 14 - 54 U/L  Alkaline Phosphatase 62 38 - 126 U/L   Total Bilirubin 0.9 0.3 - 1.2 mg/dL   GFR calc non Af Amer >60 >60 mL/min   GFR calc Af Amer >60 >60 mL/min   Anion gap 6 5 - 15  CBC  Result Value Ref Range   WBC 8.4 4.0 - 10.5 K/uL   RBC 4.18 3.87 - 5.11 MIL/uL   Hemoglobin 12.4 12.0 - 15.0 g/dL   HCT 08.6 57.8 - 46.9 %   MCV 91.6 78.0 - 100.0 fL   MCH 29.7 26.0 - 34.0 pg   MCHC 32.4 30.0 - 36.0 g/dL   RDW 62.9 52.8 - 41.3 %   Platelets 272 150 - 400 K/uL  Urinalysis, Routine w reflex microscopic  Result Value Ref Range   Color, Urine YELLOW YELLOW   APPearance CLEAR CLEAR   Specific Gravity, Urine 1.020 1.005 - 1.030   pH 8.0 5.0 - 8.0   Glucose, UA NEGATIVE NEGATIVE mg/dL   Hgb urine dipstick NEGATIVE NEGATIVE   Bilirubin Urine NEGATIVE NEGATIVE   Ketones, ur NEGATIVE NEGATIVE mg/dL   Protein, ur NEGATIVE NEGATIVE mg/dL   Nitrite NEGATIVE NEGATIVE   Leukocytes, UA NEGATIVE NEGATIVE  I-Stat beta hCG blood, ED  Result Value Ref Range   I-stat hCG, quantitative <5.0 <5 mIU/mL   Comment 3           No results found. EKG  EKG Interpretation None      Radiology No results found.  Procedures Procedures (including critical care time)  Medications Ordered in ED Medications  naproxen (NAPROSYN) tablet  500 mg (not administered)  azithromycin (ZITHROMAX) tablet 1,000 mg (not administered)  cefTRIAXone (ROCEPHIN) injection 250 mg (not administered)     Initial Impression / Assessment and Plan / ED Course  I have reviewed the triage vital signs and the nursing notes.  Pertinent labs & imaging results that were available during my care of the patient were reviewed by me and considered in my medical decision making (see chart for details).    Patient presents with abdominal pain and vaginal discharge. Patient is nontoxic appearing, in no apparent distress, vitals WNL. On exam patient is mildly tender in the suprapubic and right lower abdomen as well as to R adnexa with significant amount of yellow thin mucous discharge. No peritoneal signs on exam. Patient does not have focal McBurney's point tenderness. No CMT or chandelier sign. Lab work ordered and reviewed, findings on wet prep consistent with BV- patient states her sxs feel very similar to previous BV diagnosis. Otherwise labs grossly unremarkable, of note there is no leukocytosis, no significant electrolyte abnormality, renal function and LFTs are WNL. UA without signs of infection. Given no leukocytosis and no peritoneal signs on abdominal exam doubt appendicitis (no McBurney's point tenderness), bowel obstruction, bowel perforation, cholecystitis, diverticulitis, or pancreatitis. Patient's beta hCG negative- doubt ectopic pregnancy. Patient is afebrile, no leukocytosis, mild tenderness, no new sexual partners since last STD screen, however given previous positive chlamydia culture (04/2017) and that patient is young sexually active female with adnexal tenderness and lower abdominal pain will cover for mild/moderate PID, doubt tubo-ovarian abscess. Will treat with Ceftriaxone and Azithromycin in the ED and prescriptions for Doxycycline and Metronidazole- instructed patient she is not to consume alcohol while taking Metronidazole. Will provide  prescription for Naproxen for pain. Instructed obgyn follow up and strict return precautions. I discussed results, treatment plan, need for women's health follow-up, and return precautions with the patient. Provided opportunity for questions,  patient confirmed understanding and is in agreement with plan.    Final Clinical Impressions(s) / ED Diagnoses   Final diagnoses:  Bacterial vaginosis  Abdominal pain, unspecified abdominal location    ED Discharge Orders        Ordered    metroNIDAZOLE (FLAGYL) 500 MG tablet  2 times daily,   Status:  Discontinued     10/15/17 1246    naproxen (NAPROSYN) 500 MG tablet  2 times daily PRN,   Status:  Discontinued     10/15/17 1246    doxycycline (VIBRAMYCIN) 100 MG capsule  2 times daily     10/15/17 1316    metroNIDAZOLE (FLAGYL) 500 MG tablet  2 times daily     10/15/17 1316    naproxen (NAPROSYN) 500 MG tablet  2 times daily PRN     10/15/17 1317       Petrucelli, Kep'el R, PA-C 10/15/17 1717    Derwood Kaplan, MD 10/16/17 [phone]

## 2017-10-15 NOTE — ED Notes (Signed)
Bed: WA03 Expected date:  Expected time:  Means of arrival:  Comments: 

## 2017-10-15 NOTE — ED Triage Notes (Signed)
Patient c/o intermittent lower abd pains for 3 days. Reports that she left [facility] before being seen by EDP yesterday due to long wait. Denies any n/v/d or urinary problems.

## 2017-10-16 LAB — GC/CHLAMYDIA PROBE AMP (~~LOC~~) NOT AT ARMC
Chlamydia: NEGATIVE
Neisseria Gonorrhea: NEGATIVE

## 2017-11-20 IMAGING — US US OB TRANSVAGINAL
1 series · 15 of 28 positions shown · non-contrast
Comparison: Prior study 01/30/2016.

CLINICAL DATA: First trimester pregnancy with pelvic pain. Evaluate
viability.

EXAM:
TRANSVAGINAL OB ULTRASOUND
TECHNIQUE: Transvaginal ultrasound was performed for complete evaluation of the
gestation as well as the maternal uterus, adnexal regions, and
pelvic cul-de-sac.

[Series 1: us ob transvaginal · 15 of 35 slices shown]
[im 1/35]
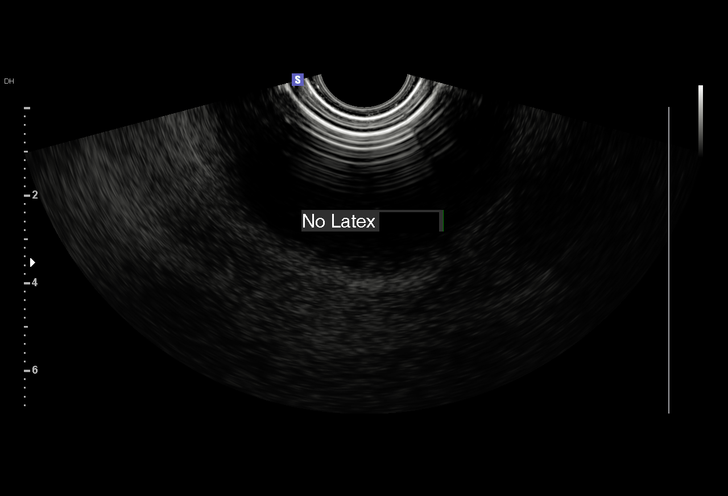
[im 3/35]
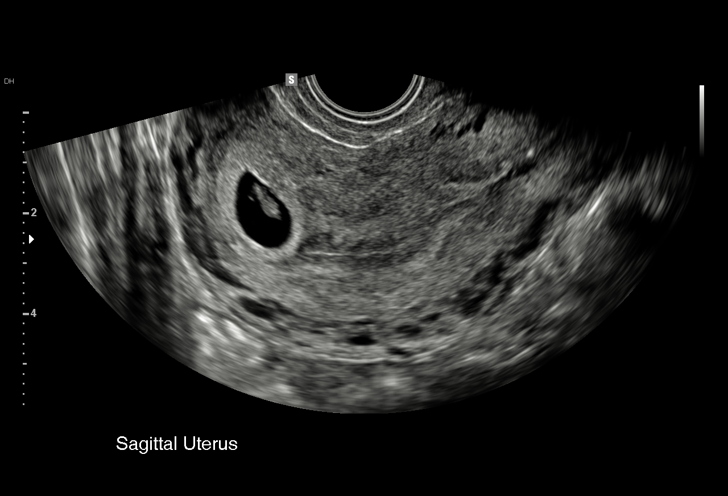
[im 6/35]
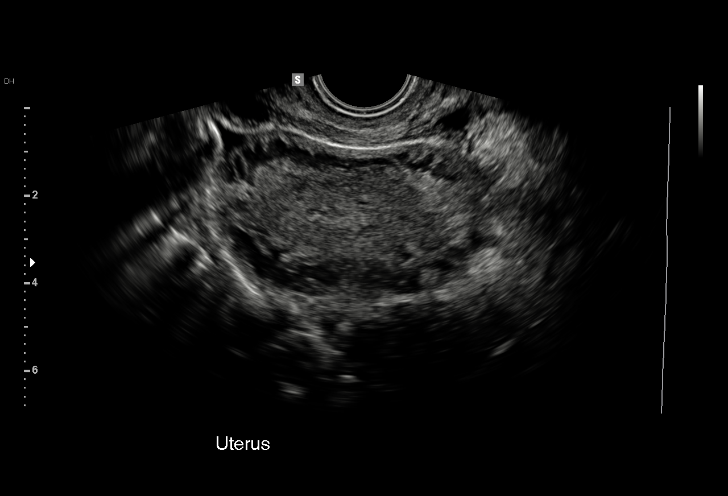
[im 8/35]
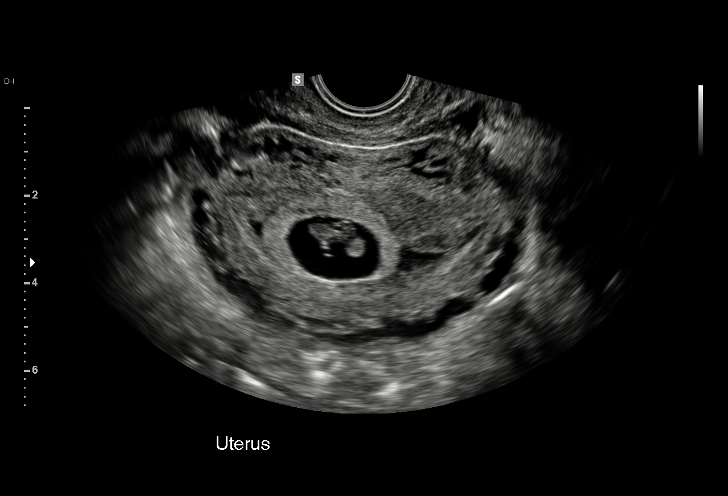
[im 11/35]
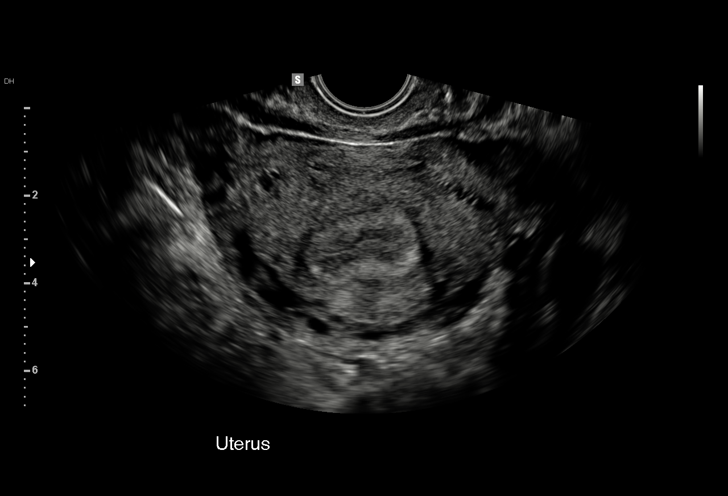
[im 13/35]
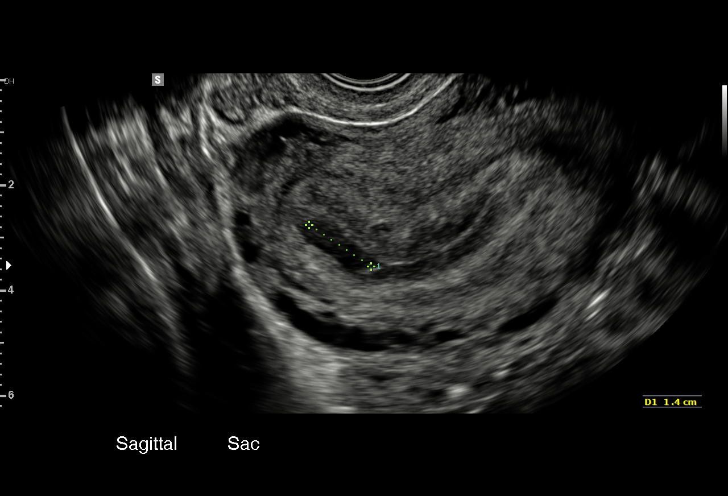
[im 16/35]
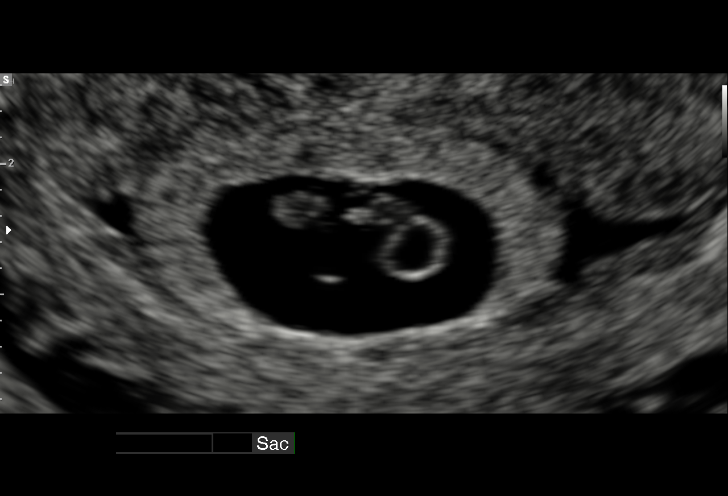
[im 18/35]
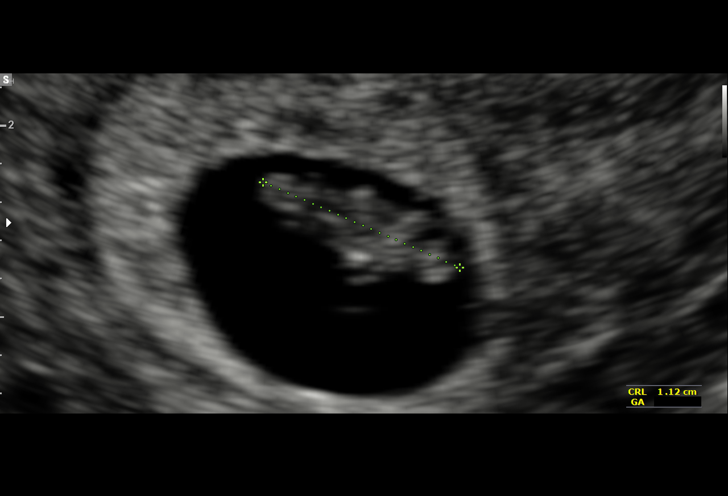
[im 19/35]
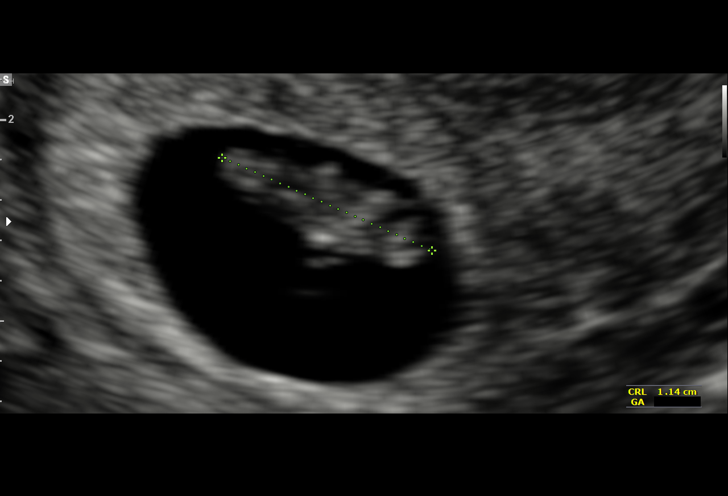
[im 22/35]
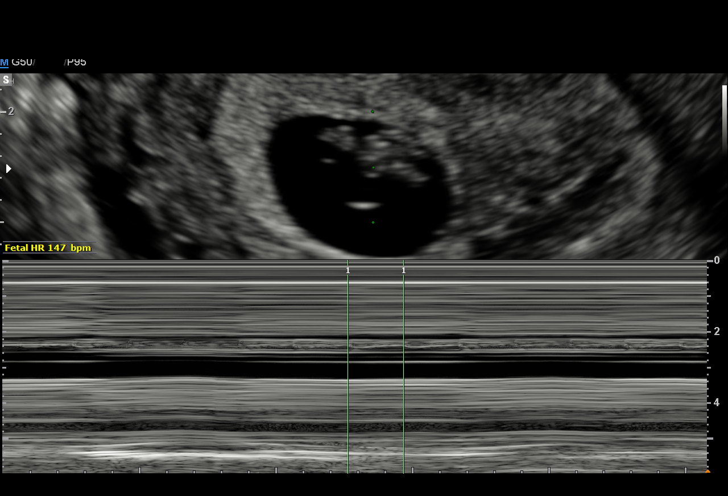
[im 24/35]
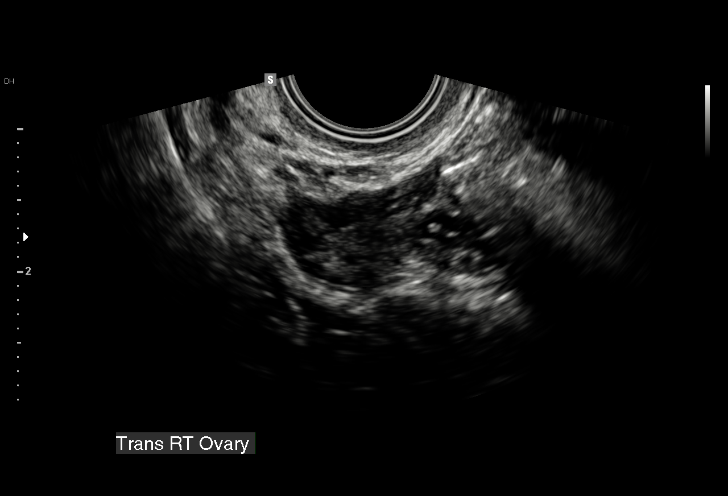
[im 27/35]
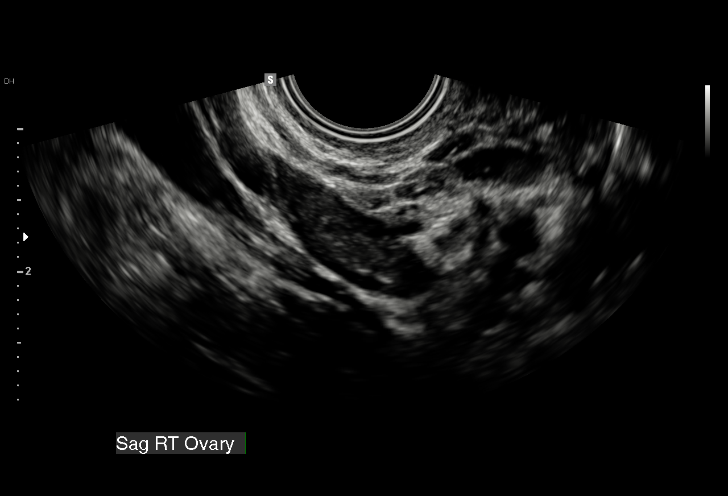
[im 29/35]
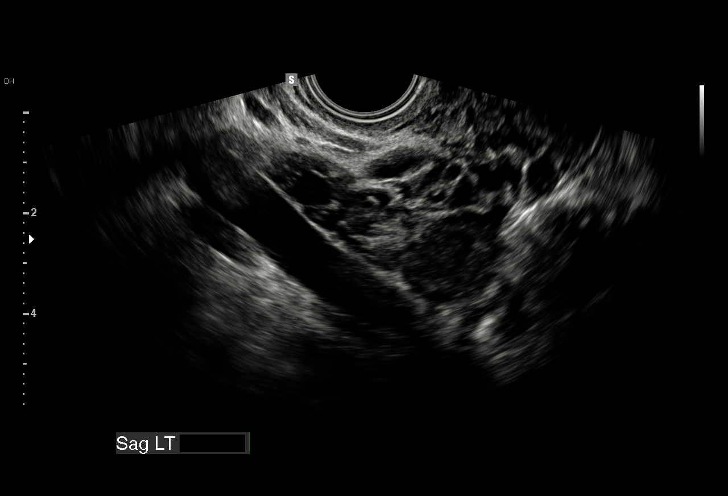
[im 32/35]
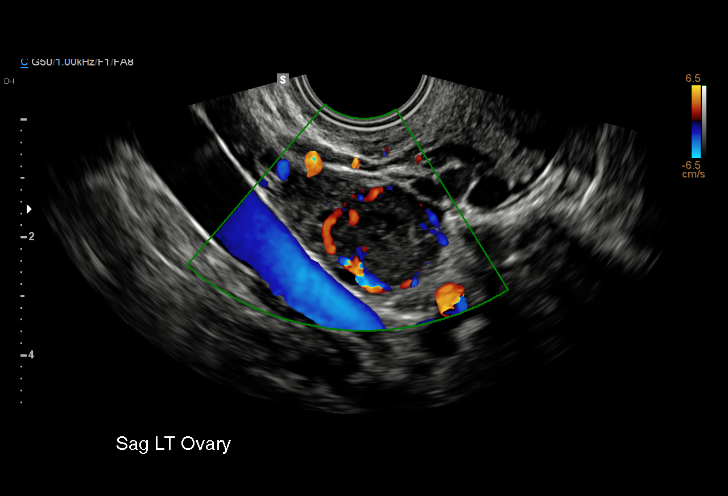
[im 35/35]
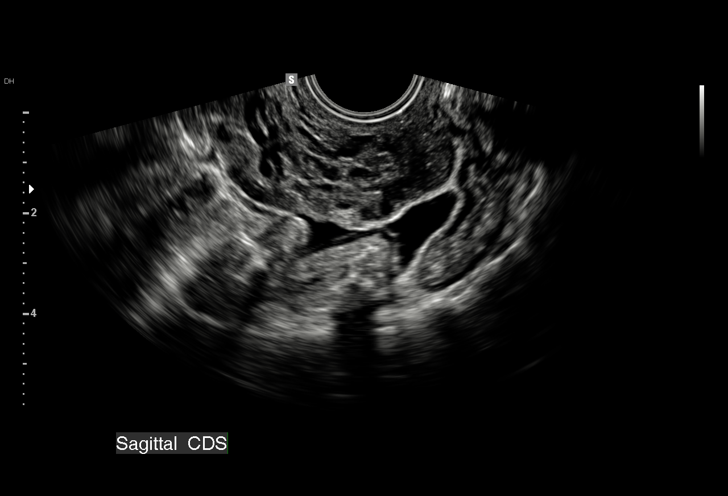

[15 of 28 positions shown; findings below may reference images not displayed]

FINDINGS: Intrauterine gestational sac: Visualized/normal in shape.

Yolk sac:  Visualized.

Embryo:  Visualized.

Cardiac Activity: Visualized.

Heart Rate: 147  bpm

CRL:  11.3  mm; 7 w 2 d;                  US EDC: 09/30/2016

Subchorionic hemorrhage: There is a small subchorionic hematoma.

Maternal uterus/adnexae: Both maternal ovaries are visualized.
Corpus luteum noted on the left. No adnexal mass or significant free
pelvic fluid.
IMPRESSION: 1. Single live intrauterine pregnancy with best estimated
gestational age of 7 weeks 2 days.
2. Small subchorionic hematoma.

## 2018-09-02 ENCOUNTER — Emergency Department (HOSPITAL_COMMUNITY): Admission: EM | Admit: 2018-09-02 | Discharge: 2018-09-02 | Discharge status: Against Medical Advice | Payer: Self-pay

## 2018-09-02 NOTE — ED Triage Notes (Signed)
No response when called to triage. 

## 2018-09-02 NOTE — ED Notes (Signed)
Patient called for triage x3 with no answer. 

## 2018-09-02 NOTE — ED Notes (Signed)
Called for triage x 2 with no answer. 

## 2018-09-15 ENCOUNTER — Emergency Department (HOSPITAL_COMMUNITY)
Admission: EM | Admit: 2018-09-15 | Discharge: 2018-09-15 | Disposition: A | Discharge status: Home / Self Care | Payer: Self-pay | Attending: Emergency Medicine | Admitting: Emergency Medicine

## 2018-09-15 ENCOUNTER — Encounter (HOSPITAL_COMMUNITY): Payer: Self-pay | Admitting: Emergency Medicine

## 2018-09-15 ENCOUNTER — Other Ambulatory Visit: Payer: Self-pay

## 2018-09-15 DIAGNOSIS — Z23 Encounter for immunization: Secondary | ICD-10-CM | POA: Insufficient documentation

## 2018-09-15 DIAGNOSIS — Y929 Unspecified place or not applicable: Secondary | ICD-10-CM | POA: Insufficient documentation

## 2018-09-15 DIAGNOSIS — Y999 Unspecified external cause status: Secondary | ICD-10-CM | POA: Insufficient documentation

## 2018-09-15 DIAGNOSIS — W268XXA Contact with other sharp object(s), not elsewhere classified, initial encounter: Secondary | ICD-10-CM | POA: Insufficient documentation

## 2018-09-15 DIAGNOSIS — Z79899 Other long term (current) drug therapy: Secondary | ICD-10-CM | POA: Insufficient documentation

## 2018-09-15 DIAGNOSIS — S51812A Laceration without foreign body of left forearm, initial encounter: Secondary | ICD-10-CM | POA: Insufficient documentation

## 2018-09-15 DIAGNOSIS — Y939 Activity, unspecified: Secondary | ICD-10-CM | POA: Insufficient documentation

## 2018-09-15 MED ORDER — TETANUS-DIPHTH-ACELL PERTUSSIS 5-2.5-18.5 LF-MCG/0.5 IM SUSP
0.5000 mL | Freq: Once | INTRAMUSCULAR | Status: AC
  Administered 2018-09-15: 0.5 mL via INTRAMUSCULAR
  Filled 2018-09-15: qty 0.5

## 2018-09-15 NOTE — ED Notes (Signed)
Bed: [facility] Expected date:  Expected time:  Means of arrival:  Comments: 

## 2018-09-15 NOTE — Discharge Instructions (Addendum)
Return if any problems.

## 2018-09-15 NOTE — ED Provider Notes (Signed)
[facility]-EMERGENCY DEPT Provider Note   CSN: [id number] Arrival date & time: 09/15/18  0915     History   Chief Complaint Chief Complaint  Patient presents with  . Laceration    HPI Nicole Howard is a 28 y.o. female.  The history is provided by the patient. No language interpreter was used.  Laceration  Location:  Shoulder/arm Shoulder/arm laceration location:  L forearm Length:  1 Quality: straight   Laceration mechanism:  Metal edge Pain details:    Quality:  Aching   Severity:  Mild   Timing:  Constant   Progression:  Worsening Foreign body present:  No foreign bodies Relieved by:  Nothing Tetanus status:  Out of date Pt reports she cut her arm on a door.  Past Medical History:  Diagnosis Date  . Medical history non-contributory     Patient Active Problem List   Diagnosis Date Noted  . Active labor 06/01/2012  . Normal delivery 06/01/2012    Past Surgical History:  Procedure Laterality Date  . FOREIGN BODY REMOVAL Left 09/18/2015   Procedure: FOREIGN BODY REMOVAL OF BIRTH CONTROL DEVICE IN LEFT ARM ADULT;  Surgeon: Luretha MurphyMatthew Martin, MD;  [facility];  Service: General;  Laterality: Left;  . NO PAST SURGERIES       OB History    Gravida  3   Para  2   Term  2   Preterm      AB      Living  2     SAB      TAB      Ectopic      Multiple      Live Births  2            Home Medications    Prior to Admission medications   Medication Sig Start Date End Date Taking? Authorizing Provider  doxycycline (VIBRAMYCIN) 100 MG capsule Take 1 capsule (100 mg total) by mouth 2 (two) times daily. 10/15/17   Petrucelli, Samantha R, PA-C  levonorgestrel (MIRENA, 52 MG,) 20 MCG/24HR IUD 1 each by Intrauterine route once. 07/08/16 10/15/17  [provider]  metroNIDAZOLE (FLAGYL) 500 MG tablet Take 1 tablet (500 mg total) by mouth 2 (two) times daily. One po bid x 7 days 10/15/17   Petrucelli,  Samantha R, PA-C  naproxen (NAPROSYN) 500 MG tablet Take 1 tablet (500 mg total) by mouth 2 (two) times daily as needed for moderate pain. 10/15/17   Petrucelli, Pleas KochSamantha R, PA-C    Family History Family History  Adopted: Yes  Problem Relation Age of Onset  . Other Neg Hx     Social History Social History   Tobacco Use  . Smoking status: Never Smoker  . Smokeless tobacco: Never Used  Substance Use Topics  . Alcohol use: No  . Drug use: No     Allergies   Tramadol   Review of Systems Review of Systems  Skin: Positive for wound.  All other systems reviewed and are negative.    Physical Exam Updated Vital Signs BP (!) 134/91 (BP Location: Right Arm)   Pulse 65   Temp 98 F (36.7 C) (Oral)   Resp 16   Ht 5\' 4"  (1.626 m)   Wt 52.2 kg   LMP 09/15/2018   SpO2 100%   BMI 19.74 kg/m   Physical Exam Vitals signs reviewed.  HENT:     Head: Normocephalic.     Nose: Nose normal.  Musculoskeletal:  Normal range of motion.  Skin:    General: Skin is warm.     Comments: 1cm laceration left forearm.  nv and ns intact   Neurological:     General: No focal deficit present.     Mental Status: She is alert.  Psychiatric:        Mood and Affect: Mood normal.      ED Treatments / Results  Labs (all labs ordered are listed, but only abnormal results are displayed) Labs Reviewed - No data to display  EKG None  Radiology No results found.  Procedures .Marland KitchenLaceration Repair Date/Time: 09/15/2018 10:52 AM Performed by: Elson Areas, PA-C Authorized by: Elson Areas, PA-C   Consent:    Consent obtained:  Verbal   Consent given by:  Patient   Risks discussed:  Pain Anesthesia (see MAR for exact dosages):    Anesthesia method:  None Laceration details:    Location:  Shoulder/arm   Shoulder/arm location:  L lower arm   Length (cm):  1 Repair type:    Repair type:  Simple Pre-procedure details:    Preparation:  Patient was prepped and draped in usual  sterile fashion Treatment:    Area cleansed with:  Saline and Shur-Clens Skin repair:    Repair method:  Tissue adhesive Post-procedure details:    Patient tolerance of procedure:  Tolerated well, no immediate complications   (including critical care time)  Medications Ordered in ED Medications  Tdap (BOOSTRIX) injection 0.5 mL (0.5 mLs Intramuscular Given 09/15/18 1017)     Initial Impression / Assessment and Plan / ED Course  I have reviewed the triage vital signs and the nursing notes.  Pertinent labs & imaging results that were available during my care of the patient were reviewed by me and considered in my medical decision making (see chart for details).     MDM  Pt counseled on wound care  Final Clinical Impressions(s) / ED Diagnoses   Final diagnoses:  Forearm laceration, left, initial encounter    ED Discharge Orders    None    An After Visit Summary was printed and given to the patient.    Osie Cheeks 09/15/18 1053    Gerhard Munch, MD 09/15/18 [phone]

## 2018-09-15 NOTE — ED Triage Notes (Signed)
Patient c/o laceration to left arm after "cutting it on door" this morning. Approximately one half inch lac to left forearm. Bleeding controlled. Unknown last tetanus shot.

## 2018-10-11 IMAGING — CT CT ABD-PELV W/ CM
2 of 4 series · 16 of 46 positions shown, 18 images · IV contrast (ISOVUE)
Comparison: None.

CLINICAL DATA: Left lower quadrant pain for the past 2 weeks.

EXAM:
CT ABDOMEN AND PELVIS WITH CONTRAST
TECHNIQUE: Multidetector CT imaging of the abdomen and pelvis was performed
using the standard protocol following bolus administration of
intravenous contrast.
CONTRAST:  80mL C5H4OO-5VV IOPAMIDOL (C5H4OO-5VV) INJECTION 61%

[Series 2: abd/pel with · axial · 0.74mm/px · z∈[+745,+1105]mm · 13 of 81 slices shown, 15 images]
[im 5/81  soft-tissue]
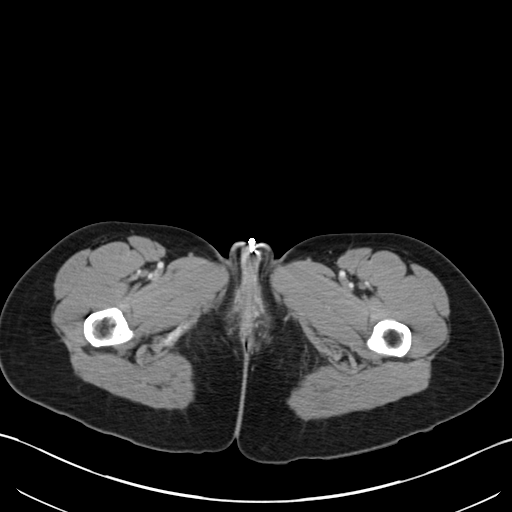
[im 5/81  bone]
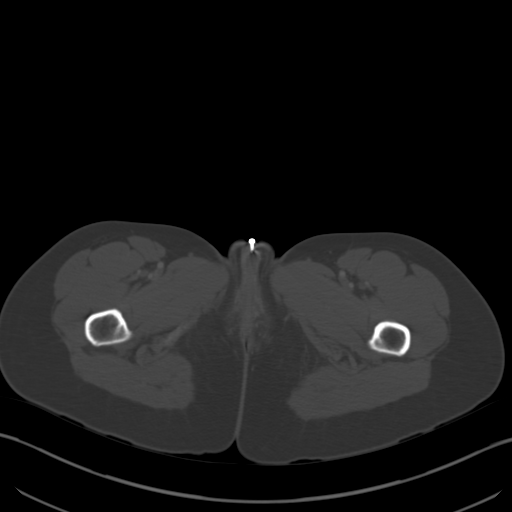
[im 13/81  soft-tissue]
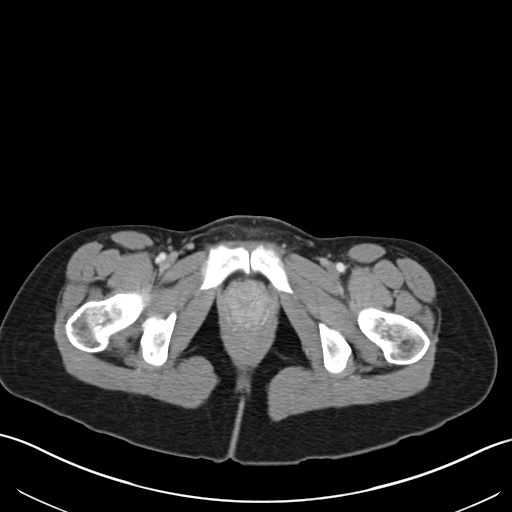
[im 17/81  soft-tissue]
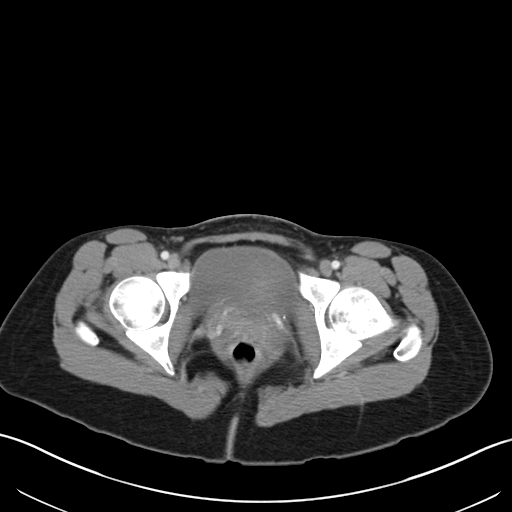
[im 25/81  soft-tissue]
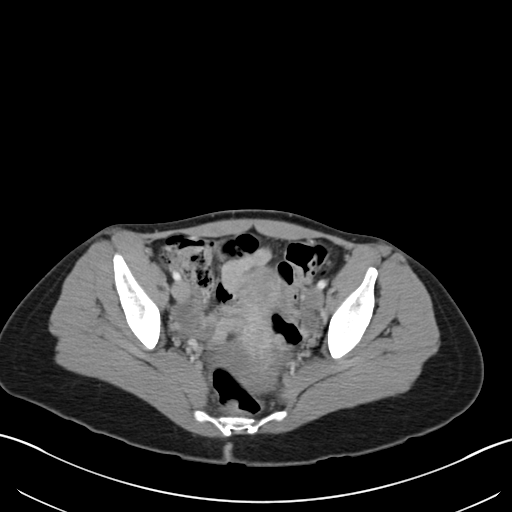
[im 29/81  soft-tissue]
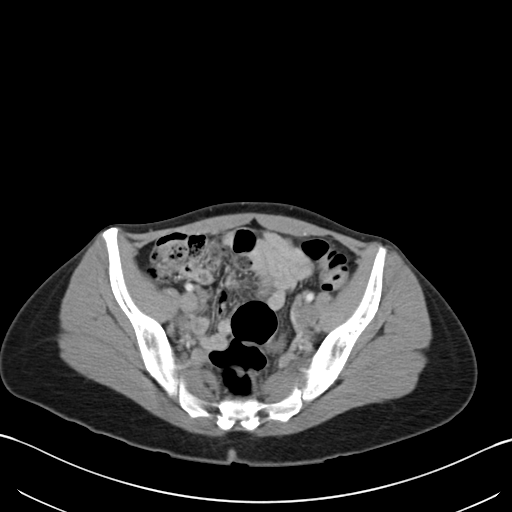
[im 37/81  soft-tissue]
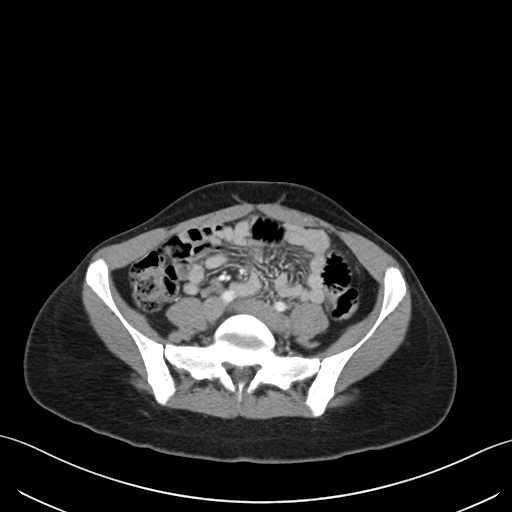
[im 41/81  soft-tissue]
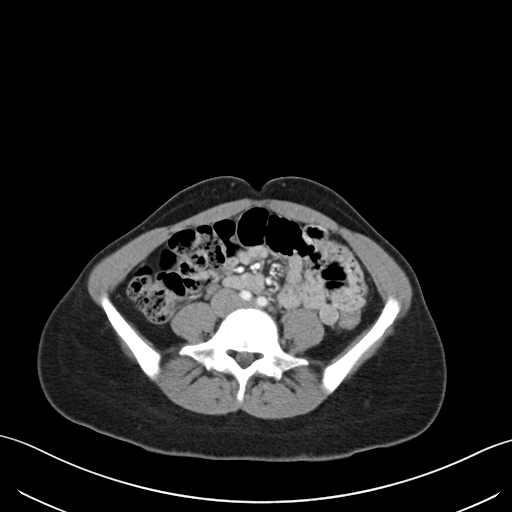
[im 45/81  soft-tissue]
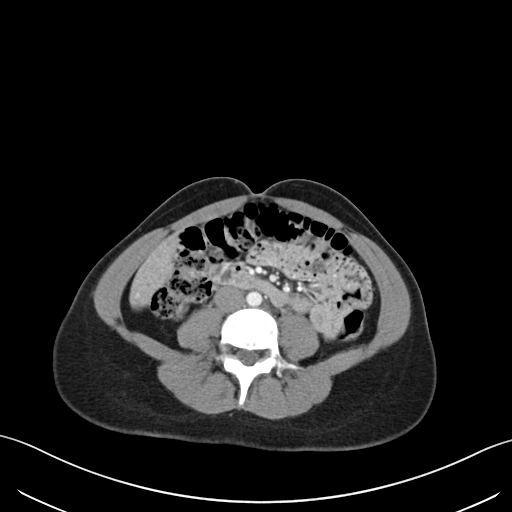
[im 53/81  soft-tissue]
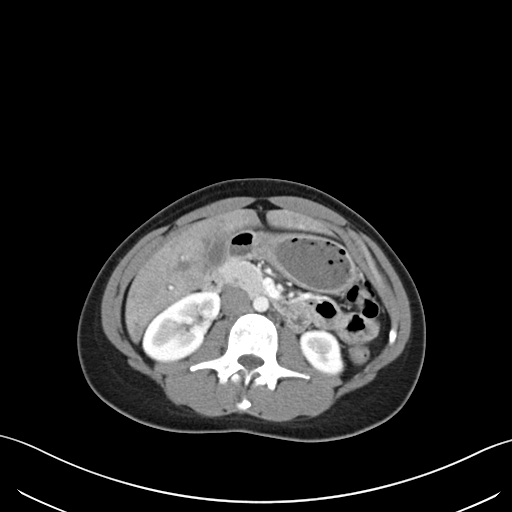
[im 53/81  bone]
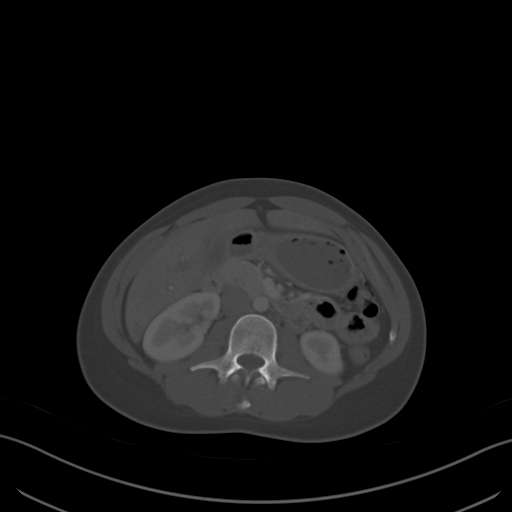
[im 57/81  soft-tissue]
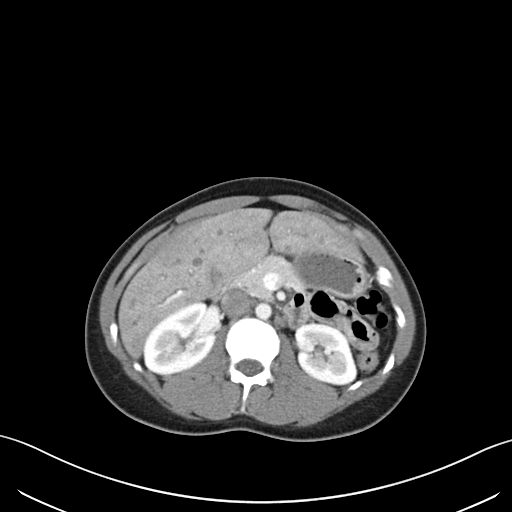
[im 65/81  soft-tissue]
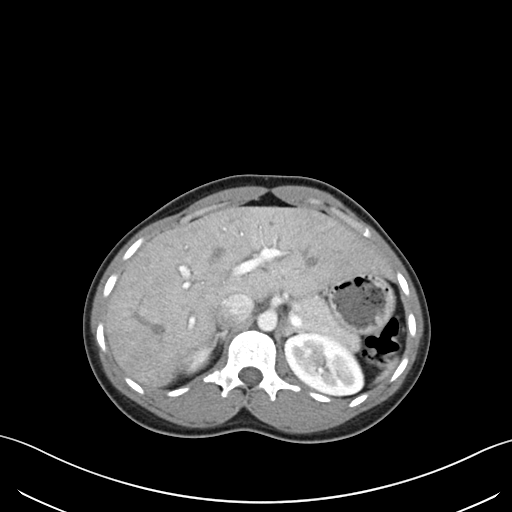
[im 69/81  soft-tissue]
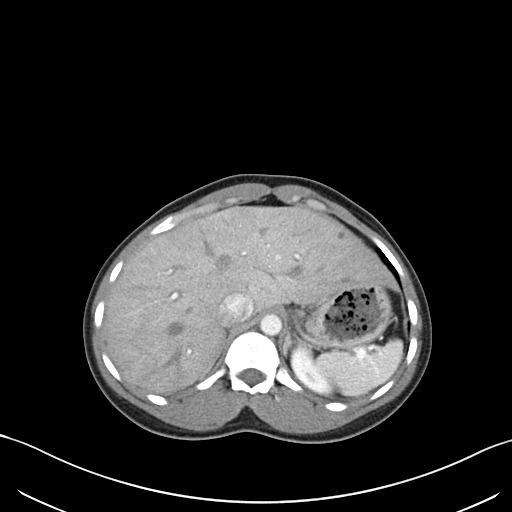
[im 77/81  soft-tissue]
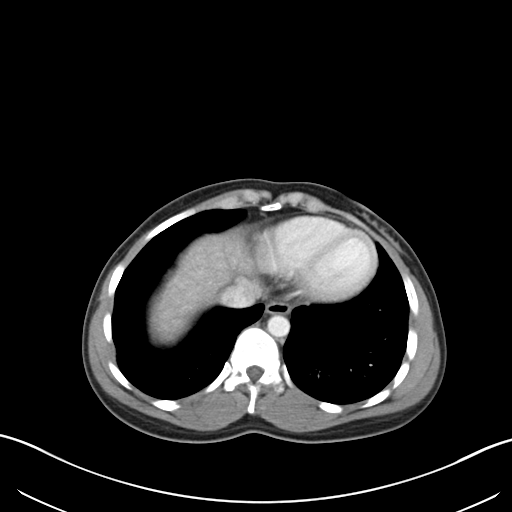

[Series 3: coronal a/|p · coronal · 0.76mm/px · 3 of 147 slices shown]
[im 49/147  soft-tissue]
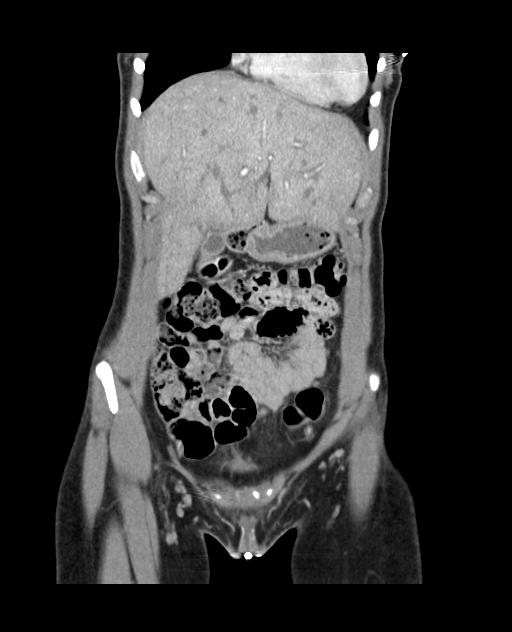
[im 65/147  soft-tissue]
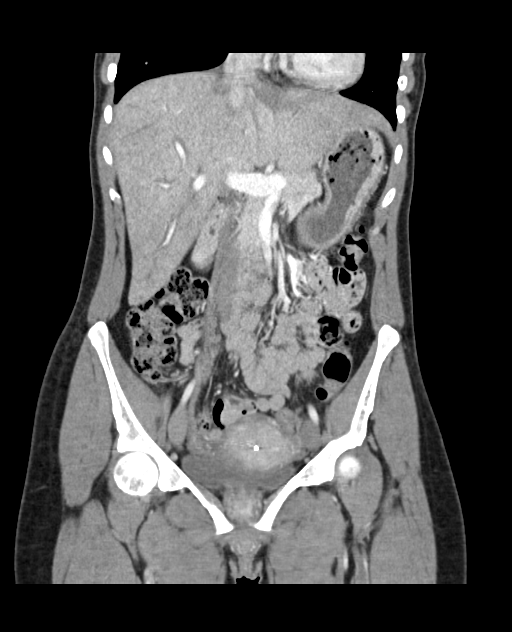
[im 82/147  soft-tissue]
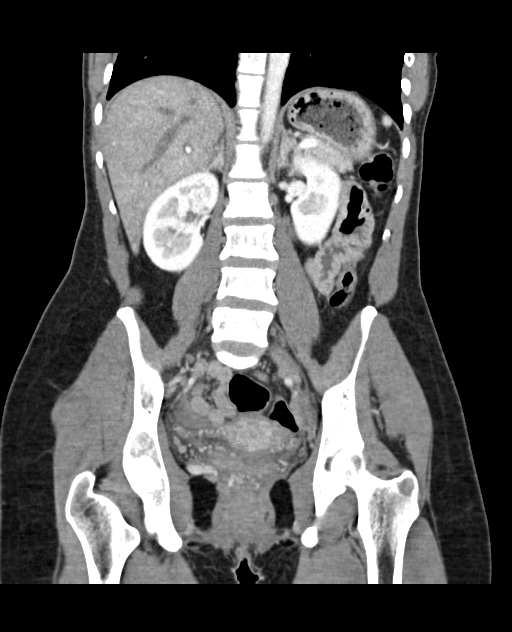

[16 of 46 positions shown; findings below may reference images not displayed]

FINDINGS: Lower chest: Clear lung bases.  Heart normal size.

Hepatobiliary: 4 mm low-density lesion in the lateral segment of the
left lobe consistent with a cyst. Liver otherwise unremarkable.
Normal gallbladder. No bile duct dilation.

Pancreas: Unremarkable. No pancreatic ductal dilatation or
surrounding inflammatory changes.

Spleen: Normal in size without focal abnormality.

Adrenals/Urinary Tract: Adrenal glands are unremarkable. Kidneys are
normal, without renal calculi, focal lesion, or hydronephrosis.
Bladder is unremarkable.

Stomach/Bowel: Stomach is unremarkable. No bowel dilation to suggest
obstruction or adynamic ileus. No wall thickening or adjacent
inflammation. Appendix not visualized. No evidence of appendicitis.

Vascular/Lymphatic: No significant vascular findings are present. No
enlarged abdominal or pelvic lymph nodes.

Reproductive: Well-positioned IUD. Uterus otherwise unremarkable. No
adnexal masses.

Other: Trace, presumed physiologic, pelvic free fluid.  No hernias.

Musculoskeletal: Normal.
IMPRESSION: 1. No acute findings. No findings to account for the patient's left
lower quadrant abdominal pain.
2. 4 mm low-density liver lesion consistent with cysts. Exam
otherwise normal.

## 2020-05-08 ENCOUNTER — Other Ambulatory Visit: Payer: Self-pay

## 2020-05-08 ENCOUNTER — Inpatient Hospital Stay (HOSPITAL_COMMUNITY)
Admission: AD | Admit: 2020-05-08 | Discharge: 2020-05-09 | Disposition: A | Discharge status: Home / Self Care | Payer: Self-pay | Attending: Obstetrics & Gynecology | Admitting: Obstetrics & Gynecology

## 2020-05-08 DIAGNOSIS — Z3202 Encounter for pregnancy test, result negative: Secondary | ICD-10-CM | POA: Insufficient documentation

## 2020-05-08 DIAGNOSIS — Z793 Long term (current) use of hormonal contraceptives: Secondary | ICD-10-CM | POA: Insufficient documentation

## 2020-05-08 DIAGNOSIS — N946 Dysmenorrhea, unspecified: Secondary | ICD-10-CM | POA: Insufficient documentation

## 2020-05-08 DIAGNOSIS — Z888 Allergy status to other drugs, medicaments and biological substances status: Secondary | ICD-10-CM | POA: Insufficient documentation

## 2020-05-08 LAB — POCT PREGNANCY, URINE: Preg Test, Ur: NEGATIVE

## 2020-05-08 LAB — URINALYSIS, ROUTINE W REFLEX MICROSCOPIC
Bacteria, UA: NONE SEEN
Bilirubin Urine: NEGATIVE
Glucose, UA: NEGATIVE mg/dL
Ketones, ur: NEGATIVE mg/dL
Leukocytes,Ua: NEGATIVE
Nitrite: NEGATIVE
Protein, ur: NEGATIVE mg/dL
Specific Gravity, Urine: 1.026 (ref 1.005–1.030)
pH: 7 (ref 5.0–8.0)

## 2020-05-08 NOTE — MAU Note (Signed)
Pt reports positive home preg test one week ago, today began having cramping and bleeding.

## 2020-05-08 NOTE — MAU Provider Note (Signed)
History     CSN: [id number]  Arrival date and time: 05/08/20 2118   First Provider Initiated Contact with Patient 05/08/20 2209      Chief Complaint  Patient presents with  . Possible Pregnancy  . Abdominal Pain  . Vaginal Bleeding   29 y.o. [id number] at unknown presenting with VB and +HPT. Reports +HPT last week. She started bright red bleeding this evening. Has not saturated a pad. Having menstrual type cramps. Rates pain 7/10.   OB History    Gravida  3   Para  2   Term  2   Preterm      AB      Living  2     SAB      TAB      Ectopic      Multiple      Live Births  2           Past Medical History:  Diagnosis Date  . Medical history non-contributory     Past Surgical History:  Procedure Laterality Date  . FOREIGN BODY REMOVAL Left 09/18/2015   Procedure: FOREIGN BODY REMOVAL OF BIRTH CONTROL DEVICE IN LEFT ARM ADULT;  Surgeon: Luretha Murphy, MD;  Location: [facility];  Service: General;  Laterality: Left;  . NO PAST SURGERIES      Family History  Adopted: Yes  Problem Relation Age of Onset  . Other Neg Hx     Social History   Tobacco Use  . Smoking status: Never Smoker  . Smokeless tobacco: Never Used  Vaping Use  . Vaping Use: Never used  Substance Use Topics  . Alcohol use: No  . Drug use: No    Allergies:  Allergies  Allergen Reactions  . Tramadol Nausea And Vomiting    Medications Prior to Admission  Medication Sig Dispense Refill Last Dose  . doxycycline (VIBRAMYCIN) 100 MG capsule Take 1 capsule (100 mg total) by mouth 2 (two) times daily. 28 capsule 0   . levonorgestrel (MIRENA, 52 MG,) 20 MCG/24HR IUD 1 each by Intrauterine route once.     . metroNIDAZOLE (FLAGYL) 500 MG tablet Take 1 tablet (500 mg total) by mouth 2 (two) times daily. One po bid x 7 days 28 tablet 0   . naproxen (NAPROSYN) 500 MG tablet Take 1 tablet (500 mg total) by mouth 2 (two) times daily as needed for moderate pain. 30 tablet 0     Review of Systems  Gastrointestinal: Positive for abdominal pain.  Genitourinary: Positive for vaginal bleeding.   Physical Exam   Blood pressure 123/81, pulse 79, temperature 98.6 F (37 C), temperature source Oral, resp. rate 15, height 5\' 4"  (1.626 m), weight 53.5 kg, last menstrual period 04/05/2020, SpO2 100 %, unknown if currently breastfeeding.  Physical Exam Vitals and nursing note reviewed.  Constitutional:      Appearance: Normal appearance.  Cardiovascular:     Rate and Rhythm: Normal rate.  Pulmonary:     Effort: Pulmonary effort is normal. No respiratory distress.  Neurological:     General: No focal deficit present.     Mental Status: She is alert and oriented to person, place, and time.  Psychiatric:        Mood and Affect: Mood normal.        Behavior: Behavior normal.    Results for orders placed or performed during the hospital encounter of 05/08/20 (from the past 24 hour(s))  Pregnancy, urine POC  Status: None   Collection Time: 05/08/20 10:06 PM  Result Value Ref Range   Preg Test, Ur NEGATIVE NEGATIVE  Urinalysis, Routine w reflex microscopic Urine, Clean Catch     Status: Abnormal   Collection Time: 05/08/20 10:48 PM  Result Value Ref Range   Color, Urine YELLOW YELLOW   APPearance HAZY (A) CLEAR   Specific Gravity, Urine 1.026 1.005 - 1.030   pH 7.0 5.0 - 8.0   Glucose, UA NEGATIVE NEGATIVE mg/dL   Hgb urine dipstick LARGE (A) NEGATIVE   Bilirubin Urine NEGATIVE NEGATIVE   Ketones, ur NEGATIVE NEGATIVE mg/dL   Protein, ur NEGATIVE NEGATIVE mg/dL   Nitrite NEGATIVE NEGATIVE   Leukocytes,Ua NEGATIVE NEGATIVE   RBC / HPF 0-5 0 - 5 RBC/hpf   WBC, UA 0-5 0 - 5 WBC/hpf   Bacteria, UA NONE SEEN NONE SEEN   Squamous Epithelial / LPF 11-20 0 - 5   Mucus PRESENT    MAU Course  Procedures  MDM UPT negative. Quant HCG ordered. Neg qhcg. Informed pt. Bleeding and cramping likely late menstrual cycle. Stable for discharge home.  Assessment and  Plan   1. Negative pregnancy test   2. Dysmenorrhea    Discharge home Follow up at [facility] for GYN care Ibuprofen/Aleve prn Return to MAU for OB emergencies  Allergies as of 05/09/2020      Reactions   Tramadol Nausea And Vomiting      Medication List    STOP taking these medications   doxycycline 100 MG capsule Commonly known as: VIBRAMYCIN   metroNIDAZOLE 500 MG tablet Commonly known as: Flagyl     TAKE these medications   Mirena (52 MG) 20 MCG/24HR IUD Generic drug: levonorgestrel 1 each by Intrauterine route once.   naproxen 500 MG tablet Commonly known as: NAPROSYN Take 1 tablet (500 mg total) by mouth 2 (two) times daily as needed for moderate pain.      Donette Larry, CNM 05/08/2020, 10:14 PM

## 2020-05-09 DIAGNOSIS — Z3202 Encounter for pregnancy test, result negative: Secondary | ICD-10-CM

## 2020-05-09 DIAGNOSIS — N946 Dysmenorrhea, unspecified: Secondary | ICD-10-CM

## 2020-05-09 LAB — HCG, QUANTITATIVE, PREGNANCY: hCG, Beta Chain, Quant, S: 3 m[IU]/mL (ref <5)

## 2020-05-09 NOTE — Discharge Instructions (Signed)
Dysmenorrhea Dysmenorrhea means painful cramps during your period (menstrual period). You will have pain in your lower belly (abdomen). The pain is caused by the tightening (contracting) of the muscles of the womb (uterus). The pain may be mild or very bad. With this condition, you may:  Have a headache.  Feel sick to your stomach (nauseous).  Throw up (vomit).  Have lower back pain. Follow these instructions at home: Helping pain and cramping   Put heat on your lower back or belly when you have pain or cramps. Use the heat source that your doctor tells you to use. ? Place a towel between your skin and the heat. ? Leave the heat on for 20-30 minutes. ? Remove the heat if your skin turns bright red. This is especially important if you cannot feel pain, heat, or cold. ? Do not have a heating pad on during sleep.  Do aerobic exercises. These include walking, swimming, or biking. These may help with cramps.  Massage your lower back or belly. This may help lessen pain. General instructions  Take over-the-counter and prescription medicines only as told by your doctor.  Do not drive or use heavy machinery while taking prescription pain medicine.  Avoid alcohol and caffeine during and right before your period. These can make cramps worse.  Do not use any products that have nicotine or tobacco. These include cigarettes and e-cigarettes. If you need help quitting, ask your doctor.  Keep all follow-up visits as told by your doctor. This is important. Contact a doctor if:  You have pain that gets worse.  You have pain that does not get better with medicine.  You have pain during sex.  You feel sick to your stomach or you throw up during your period, and medicine does not help. Get help right away if:  You pass out (faint). Summary  Dysmenorrhea means painful cramps during your period (menstrual period).  Put heat on your lower back or belly when you have pain or cramps.  Do  exercises like walking, swimming, or biking to help with cramps.  Contact a doctor if you have pain during sex. This information is not intended to replace advice given to you by your health care provider. Make sure you discuss any questions you have with your health care provider. Document Revised: 07/18/2017 Document Reviewed: 08/22/2016 Elsevier Patient Education  2020 [employer]  

## 2020-05-10 ENCOUNTER — Ambulatory Visit: Payer: Self-pay

## 2020-06-12 ENCOUNTER — Other Ambulatory Visit: Payer: Self-pay

## 2020-06-12 ENCOUNTER — Emergency Department (HOSPITAL_COMMUNITY)
Admission: EM | Admit: 2020-06-12 | Discharge: 2020-06-13 | Disposition: A | Discharge status: Home / Self Care | Payer: Self-pay | Attending: Emergency Medicine | Admitting: Emergency Medicine

## 2020-06-12 ENCOUNTER — Encounter (HOSPITAL_COMMUNITY): Payer: Self-pay | Admitting: Emergency Medicine

## 2020-06-12 DIAGNOSIS — Z5321 Procedure and treatment not carried out due to patient leaving prior to being seen by health care provider: Secondary | ICD-10-CM | POA: Insufficient documentation

## 2020-06-12 DIAGNOSIS — N898 Other specified noninflammatory disorders of vagina: Secondary | ICD-10-CM | POA: Insufficient documentation

## 2020-06-12 LAB — URINALYSIS, ROUTINE W REFLEX MICROSCOPIC
Bacteria, UA: NONE SEEN
Bilirubin Urine: NEGATIVE
Glucose, UA: NEGATIVE mg/dL
Ketones, ur: NEGATIVE mg/dL
Nitrite: NEGATIVE
Protein, ur: NEGATIVE mg/dL
Specific Gravity, Urine: 1.006 (ref 1.005–1.030)
pH: 7 (ref 5.0–8.0)

## 2020-06-12 NOTE — ED Triage Notes (Signed)
Patient arrives to ED with complaints of abnormal vaginal discharge and foul odor x1 week. Partner was tested positive for trichomonas this morning.

## 2020-06-13 NOTE — ED Notes (Signed)
Pt said she was leaving.

## 2020-06-14 ENCOUNTER — Encounter (HOSPITAL_COMMUNITY): Payer: Self-pay

## 2020-06-14 ENCOUNTER — Emergency Department (HOSPITAL_COMMUNITY)
Admission: EM | Admit: 2020-06-14 | Discharge: 2020-06-15 | Disposition: A | Discharge status: Home / Self Care | Payer: Self-pay | Attending: Emergency Medicine | Admitting: Emergency Medicine

## 2020-06-14 DIAGNOSIS — N760 Acute vaginitis: Secondary | ICD-10-CM | POA: Insufficient documentation

## 2020-06-14 DIAGNOSIS — A599 Trichomoniasis, unspecified: Secondary | ICD-10-CM | POA: Insufficient documentation

## 2020-06-14 DIAGNOSIS — B9689 Other specified bacterial agents as the cause of diseases classified elsewhere: Secondary | ICD-10-CM | POA: Insufficient documentation

## 2020-06-14 LAB — CBC
HCT: 38.7 % (ref 36.0–46.0)
Hemoglobin: 12.2 g/dL (ref 12.0–15.0)
MCH: 28.8 pg (ref 26.0–34.0)
MCHC: 31.5 g/dL (ref 30.0–36.0)
MCV: 91.5 fL (ref 80.0–100.0)
Platelets: 428 10*3/uL — ABNORMAL HIGH (ref 150–400)
RBC: 4.23 MIL/uL (ref 3.87–5.11)
RDW: 13.5 % (ref 11.5–15.5)
WBC: 9.4 10*3/uL (ref 4.0–10.5)
nRBC: 0 % (ref 0.0–0.2)

## 2020-06-14 LAB — URINALYSIS, ROUTINE W REFLEX MICROSCOPIC
Bacteria, UA: NONE SEEN
Bilirubin Urine: NEGATIVE
Glucose, UA: NEGATIVE mg/dL
Hgb urine dipstick: NEGATIVE
Ketones, ur: NEGATIVE mg/dL
Nitrite: NEGATIVE
Protein, ur: NEGATIVE mg/dL
Specific Gravity, Urine: 1.024 (ref 1.005–1.030)
pH: 7 (ref 5.0–8.0)

## 2020-06-14 LAB — COMPREHENSIVE METABOLIC PANEL
ALT: 37 U/L (ref 0–44)
AST: 24 U/L (ref 15–41)
Albumin: 4.1 g/dL (ref 3.5–5.0)
Alkaline Phosphatase: 59 U/L (ref 38–126)
Anion gap: 9 (ref 5–15)
BUN: 14 mg/dL (ref 6–20)
CO2: 26 mmol/L (ref 22–32)
Calcium: 9.5 mg/dL (ref 8.9–10.3)
Chloride: 104 mmol/L (ref 98–111)
Creatinine, Ser: 0.87 mg/dL (ref 0.44–1.00)
GFR, Estimated: >60 mL/min (ref >60)
Glucose, Bld: 98 mg/dL (ref 70–99)
Potassium: 3.9 mmol/L (ref 3.5–5.1)
Sodium: 139 mmol/L (ref 135–145)
Total Bilirubin: 0.6 mg/dL (ref 0.3–1.2)
Total Protein: 7.4 g/dL (ref 6.5–8.1)

## 2020-06-14 LAB — I-STAT BETA HCG BLOOD, ED (MC, WL, AP ONLY): I-stat hCG, quantitative: <5 m[IU]/mL (ref <5)

## 2020-06-14 LAB — LIPASE, BLOOD: Lipase: 45 U/L (ref 11–51)

## 2020-06-14 NOTE — ED Provider Notes (Signed)
[facility] EMERGENCY DEPARTMENT Provider Note   CSN: [id number] Arrival date & time: 06/14/20  1842     History Chief Complaint  Patient presents with  . Abdominal Pain    Nicole Howard is a 29 y.o. female with no significant past medical history who presents to the emergency department with a chief complaint of pelvic pain.  The patient reports bilateral pelvic pain accompanied by vaginal discharge and dysuria for the last 2 weeks.  Symptoms have been constant, but have not been worsening since onset.  She characterizes the pain as achy.  No known aggravating or alleviating factors.  She denies fever, chills, nausea, vomiting, constipation, diarrhea, back pain, flank pain, chest pain, shortness of breath, URI symptoms.  No treatment prior to arrival.  She is currently sexually active with 1 female partner.  Reports that she was informed by her sexual partner recently that she tested positive for trichomonas.  The patient reports that she has not yet sought testing or treatment.  She has no concerns for pregnancy at this time.  The history is provided by the patient and medical records. No language interpreter was used.       Past Medical History:  Diagnosis Date  . Medical history non-contributory     Patient Active Problem List   Diagnosis Date Noted  . Active labor 06/01/2012  . Normal delivery 06/01/2012    Past Surgical History:  Procedure Laterality Date  . FOREIGN BODY REMOVAL Left 09/18/2015   Procedure: FOREIGN BODY REMOVAL OF BIRTH CONTROL DEVICE IN LEFT ARM ADULT;  Surgeon: Luretha Murphy, MD;  Location: [facility];  Service: General;  Laterality: Left;  . NO PAST SURGERIES       OB History    Gravida  3   Para  2   Term  2   Preterm      AB      Living  2     SAB      TAB      Ectopic      Multiple      Live Births  2           Family History  Adopted: Yes  Problem Relation Age of Onset  . Other  Neg Hx     Social History   Tobacco Use  . Smoking status: Never Smoker  . Smokeless tobacco: Never Used  Vaping Use  . Vaping Use: Never used  Substance Use Topics  . Alcohol use: No  . Drug use: No    Home Medications Prior to Admission medications   Medication Sig Start Date End Date Taking? Authorizing Provider  doxycycline (VIBRAMYCIN) 100 MG capsule Take 1 capsule (100 mg total) by mouth 2 (two) times daily for 7 days. 06/15/20 06/22/20  Talbot Monarch A, PA-C  levonorgestrel (MIRENA, 52 MG,) 20 MCG/24HR IUD 1 each by Intrauterine route once. 07/08/16 10/15/17  [provider]  metroNIDAZOLE (FLAGYL) 500 MG tablet Take 1 tablet (500 mg total) by mouth 2 (two) times daily. 06/15/20   Nialah Saravia A, PA-C    Allergies    Tramadol  Review of Systems   Review of Systems  Constitutional: Negative for activity change, chills and fever.  HENT: Negative for congestion and sore throat.   Eyes: Negative for visual disturbance.  Respiratory: Negative for cough and shortness of breath.   Cardiovascular: Negative for chest pain and palpitations.  Gastrointestinal: Positive for abdominal pain. Negative for constipation, diarrhea, nausea  and vomiting.  Endocrine: Negative for polyuria.  Genitourinary: Positive for dysuria, pelvic pain and vaginal discharge. Negative for flank pain, frequency, hematuria, urgency, vaginal bleeding and vaginal pain.  Musculoskeletal: Negative for back pain, myalgias, neck pain and neck stiffness.  Skin: Negative for rash and wound.  Allergic/Immunologic: Negative for immunocompromised state.  Neurological: Negative for dizziness, seizures, syncope, weakness, numbness and headaches.  Psychiatric/Behavioral: Negative for confusion.    Physical Exam Updated Vital Signs BP 133/68 (BP Location: Right Arm)   Pulse 80   Temp 98 F (36.7 C) (Oral)   Resp 19   SpO2 99%   Physical Exam Vitals and nursing note reviewed.  Constitutional:       General: She is not in acute distress.    Appearance: She is not ill-appearing, toxic-appearing or diaphoretic.  HENT:     Head: Normocephalic.  Eyes:     Conjunctiva/sclera: Conjunctivae normal.  Cardiovascular:     Rate and Rhythm: Normal rate and regular rhythm.     Pulses: Normal pulses.     Heart sounds: Normal heart sounds. No murmur heard.  No friction rub. No gallop.   Pulmonary:     Effort: Pulmonary effort is normal. No respiratory distress.     Breath sounds: Normal breath sounds. No stridor. No wheezing, rhonchi or rales.  Chest:     Chest wall: No tenderness.  Abdominal:     General: There is no distension.     Palpations: Abdomen is soft. There is no mass.     Tenderness: There is abdominal tenderness. There is no right CVA tenderness, left CVA tenderness, guarding or rebound.     Hernia: No hernia is present.     Comments: Tender to palpation in the suprapubic region without rebound or guarding.  Abdomen is soft and nondistended.  Normoactive bowel sounds.  No tenderness palpation over McBurney's point.  No CVA tenderness bilaterally.  Negative Murphy sign.  Genitourinary:    Comments: Chaperoned exam Moderate amount of yellowish discharge in the vaginal vault.  No cervical motion tenderness.  Cervix is not friable.  No adnexal tenderness or masses bilaterally. Musculoskeletal:     Cervical back: Normal range of motion and neck supple.  Skin:    General: Skin is warm.     Findings: No rash.  Neurological:     Mental Status: She is alert.  Psychiatric:        Behavior: Behavior normal.     ED Results / Procedures / Treatments   Labs (all labs ordered are listed, but only abnormal results are displayed) Labs Reviewed  WET PREP, GENITAL - Abnormal; Notable for the following components:      Result Value   Trich, Wet Prep PRESENT (*)    Clue Cells Wet Prep HPF POC PRESENT (*)    WBC, Wet Prep HPF POC MANY (*)    All other components within normal limits  CBC  - Abnormal; Notable for the following components:   Platelets 428 (*)    All other components within normal limits  URINALYSIS, ROUTINE W REFLEX MICROSCOPIC - Abnormal; Notable for the following components:   APPearance HAZY (*)    Leukocytes,Ua LARGE (*)    All other components within normal limits  URINE CULTURE  LIPASE, BLOOD  COMPREHENSIVE METABOLIC PANEL  RPR  HIV ANTIBODY (ROUTINE TESTING W REFLEX)  I-STAT BETA HCG BLOOD, ED ([facility], [facility], [facility] ONLY)  GC/CHLAMYDIA PROBE AMP () NOT AT [facility]    EKG None  Radiology No results found.  Procedures Procedures (including critical care time)  Medications Ordered in ED Medications  lidocaine (PF) (XYLOCAINE) 1 % injection (has no administration in time range)  cefTRIAXone (ROCEPHIN) injection 500 mg (500 mg Intramuscular Given 06/15/20 0107)    ED Course  I have reviewed the triage vital signs and the nursing notes.  Pertinent labs & imaging results that were available during my care of the patient were reviewed by me and considered in my medical decision making (see chart for details).    MDM Rules/Calculators/A&P                          29 year old female with no significant past medical history presenting with 2 weeks of lower abdominal and pelvic pain, dysuria, and vaginal discharge.  Reports that she was recently informed that her sexual partner tested positive for trichomonas.   This patient presents to the ED for concern of  vaginal discharge and abdominal pelvic pain this involves an extensive number of treatment options, and is a complaint that carries with it a high risk of complications and morbidity.  The differential diagnosis includes STI, TOA, pelvic inflammatory disease, appendicitis, ovarian torsion, diverticulitis, UTI, pyelonephritis, kidney stone..      Lab Tests:    I Ordered, reviewed, and interpreted labs, which included  wet prep, GC chlamydia, RPR, HIV, CBC, metabolic panel, and urinalysis that  showed clue cells and trichomonas on wet prep that suggest trichomoniasis infection and bacterial vaginosis.  UA also with leukocyte esterase, but I suspect this is likely secondary to trichomoniasis infection.  Urine culture is pending.  RPR, HIV, gonorrhea, and Chlamydia test are pending.   Medicines ordered:    I ordered Rocephin.   Additional history obtained:    Previous records obtained and reviewed   Reevaluation:   After the interventions stated above, I reevaluated the patient and found improved.  Patient has been counseled on CDC guidelines since she tested positive for trichomonas.  She opted for treatment for gonorrhea and chlamydia since these test are pending.  She was given Rocephin in the ER.  She will be discharged with doxycycline and Flagyl.  Advised to avoid alcohol while taking Flagyl.  She did not have any evidence of PID on her exam.  Doubt TOA or ovarian torsion.  ER return precautions given.  She is hemodynamically stable no acute distress.  Safe for discharge home with outpatient follow-up as indicated.   Final Clinical Impression(s) / ED Diagnoses Final diagnoses:  Trichomoniasis  Bacterial vaginosis    Rx / DC Orders ED Discharge Orders         Ordered    doxycycline (VIBRAMYCIN) 100 MG capsule  2 times daily        06/15/20 0048    metroNIDAZOLE (FLAGYL) 500 MG tablet  2 times daily        06/15/20 0048           Raywood Wailes, Pedro Earls A, PA-C 06/15/20 0358    Zadie Rhine, MD 06/15/20 0400

## 2020-06-14 NOTE — ED Triage Notes (Signed)
Pt reports that she has been having lower abd pain and vaginal discharge, came Monday and LWBS, pt reports partner diagnosed with trichomonas

## 2020-06-15 LAB — GC/CHLAMYDIA PROBE AMP (~~LOC~~) NOT AT ARMC
Chlamydia: NEGATIVE
Comment: NEGATIVE
Comment: NORMAL
Neisseria Gonorrhea: NEGATIVE

## 2020-06-15 LAB — WET PREP, GENITAL
Sperm: NONE SEEN
Yeast Wet Prep HPF POC: NONE SEEN

## 2020-06-15 LAB — HIV ANTIBODY (ROUTINE TESTING W REFLEX): HIV Screen 4th Generation wRfx: NONREACTIVE

## 2020-06-15 LAB — RPR: RPR Ser Ql: NONREACTIVE

## 2020-06-15 MED ORDER — DOXYCYCLINE HYCLATE 100 MG PO CAPS: 100.0000 mg | ORAL_CAPSULE | Freq: Two times a day (BID) | ORAL | 0 refills | Status: AC

## 2020-06-15 MED ORDER — METRONIDAZOLE 500 MG PO TABS: 500.0000 mg | ORAL_TABLET | Freq: Two times a day (BID) | ORAL | 0 refills | Status: DC

## 2020-06-15 MED ORDER — CEFTRIAXONE SODIUM 500 MG IJ SOLR
500.0000 mg | Freq: Once | INTRAMUSCULAR | Status: AC
  Administered 2020-06-15: 500 mg via INTRAMUSCULAR
  Filled 2020-06-15: qty 500

## 2020-06-15 MED ORDER — LIDOCAINE HCL (PF) 1 % IJ SOLN
INTRAMUSCULAR | Status: AC
  Filled 2020-06-15: qty 5

## 2020-06-15 NOTE — Discharge Instructions (Addendum)
Thank you for allowing me to care for you today in the Emergency Department.   You were seen in the emergency department tonight for abdominal and pelvic pain, vaginal discharge, and burning with urination.  You tested positive for trichomoniasis.   This is an STD that is transmitted through sexual intercourse with someone that currently is infected with the infection.  It is treated with an antibiotic called Flagyl.  Take 1 tablet by mouth 2 times daily for the next 7 days.  It is imperative that you complete the entire course.  It is also very important you do not drink alcohol while you are taking this medication as it can cause severe vomiting.  Your gonorrhea and Chlamydia test are pending.  However, you have been treated for both of these infections.  You were given a shot of Rocephin in the ER and have been discharged with a course of doxycycline.  Take 1 tablet of doxycycline 2 times daily for the next 7 days.  You can take Motrin and Tylenol as needed for pain.  Your symptoms should significantly improve after you have been taking antibiotics for several days.  Per CDC guidelines, it is important he abstain from all sexual activities for 7 to 10 days after you have finished treatment for an STD.  This means that you should not engage in any sexual activity for at least 14 to 17 days.  Is also important that your partner completes the entire course of antibiotics.  Please note that you can continue to get reinfected if you do not follow these guidelines.  You can follow-up with the health department for routine STD screening.  Also, your syphilis and HIV test are pending.  You can view the results of these test in my chart or you may receive a call from the hospital if either of these test are positive.   Return to the emergency department if you develop high fevers with worsening abdominal pain, uncontrollable vomiting, or other new, concerning symptoms.

## 2020-06-16 LAB — URINE CULTURE

## 2020-06-27 ENCOUNTER — Other Ambulatory Visit: Payer: Self-pay

## 2020-09-15 ENCOUNTER — Encounter (HOSPITAL_COMMUNITY): Payer: Self-pay | Admitting: Obstetrics & Gynecology

## 2020-09-15 ENCOUNTER — Inpatient Hospital Stay (HOSPITAL_COMMUNITY)
Admission: AD | Admit: 2020-09-15 | Discharge: 2020-09-15 | Disposition: A | Discharge status: Home / Self Care | Payer: Medicaid Other | Attending: Obstetrics & Gynecology | Admitting: Obstetrics & Gynecology

## 2020-09-15 ENCOUNTER — Telehealth: Payer: Self-pay | Admitting: Obstetrics and Gynecology

## 2020-09-15 ENCOUNTER — Other Ambulatory Visit: Payer: Self-pay

## 2020-09-15 DIAGNOSIS — O99611 Diseases of the digestive system complicating pregnancy, first trimester: Secondary | ICD-10-CM | POA: Diagnosis not present

## 2020-09-15 DIAGNOSIS — R0602 Shortness of breath: Secondary | ICD-10-CM | POA: Insufficient documentation

## 2020-09-15 DIAGNOSIS — O26891 Other specified pregnancy related conditions, first trimester: Secondary | ICD-10-CM

## 2020-09-15 DIAGNOSIS — O210 Mild hyperemesis gravidarum: Secondary | ICD-10-CM | POA: Diagnosis not present

## 2020-09-15 DIAGNOSIS — Z3A10 10 weeks gestation of pregnancy: Secondary | ICD-10-CM | POA: Insufficient documentation

## 2020-09-15 DIAGNOSIS — Z885 Allergy status to narcotic agent status: Secondary | ICD-10-CM | POA: Diagnosis not present

## 2020-09-15 DIAGNOSIS — Z888 Allergy status to other drugs, medicaments and biological substances status: Secondary | ICD-10-CM | POA: Diagnosis not present

## 2020-09-15 DIAGNOSIS — Z20822 Contact with and (suspected) exposure to covid-19: Secondary | ICD-10-CM | POA: Insufficient documentation

## 2020-09-15 DIAGNOSIS — K5900 Constipation, unspecified: Secondary | ICD-10-CM

## 2020-09-15 DIAGNOSIS — O219 Vomiting of pregnancy, unspecified: Secondary | ICD-10-CM

## 2020-09-15 DIAGNOSIS — R519 Headache, unspecified: Secondary | ICD-10-CM | POA: Diagnosis not present

## 2020-09-15 HISTORY — DX: Headache, unspecified: R51.9

## 2020-09-15 LAB — SARS CORONAVIRUS 2 (TAT 6-24 HRS): SARS Coronavirus 2: NEGATIVE

## 2020-09-15 LAB — URINALYSIS, ROUTINE W REFLEX MICROSCOPIC
Bilirubin Urine: NEGATIVE
Glucose, UA: NEGATIVE mg/dL
Hgb urine dipstick: NEGATIVE
Ketones, ur: NEGATIVE mg/dL
Leukocytes,Ua: NEGATIVE
Nitrite: NEGATIVE
Protein, ur: NEGATIVE mg/dL
Specific Gravity, Urine: 1.025 (ref 1.005–1.030)
pH: 6 (ref 5.0–8.0)

## 2020-09-15 LAB — POCT PREGNANCY, URINE: Preg Test, Ur: POSITIVE — AB

## 2020-09-15 MED ORDER — DIPHENHYDRAMINE HCL 50 MG/ML IJ SOLN
25.0000 mg | Freq: Once | INTRAMUSCULAR | Status: AC
  Administered 2020-09-15: 25 mg via INTRAVENOUS
  Filled 2020-09-15: qty 1

## 2020-09-15 MED ORDER — METOCLOPRAMIDE HCL 5 MG/ML IJ SOLN
10.0000 mg | Freq: Once | INTRAMUSCULAR | Status: AC
  Administered 2020-09-15: 10 mg via INTRAVENOUS
  Filled 2020-09-15: qty 2

## 2020-09-15 MED ORDER — POLYETHYLENE GLYCOL 3350 17 G PO PACK: 17.0000 g | PACK | Freq: Every day | ORAL | 0 refills | Status: DC

## 2020-09-15 MED ORDER — PROMETHAZINE HCL 12.5 MG PO TABS: 12.5000 mg | ORAL_TABLET | Freq: Four times a day (QID) | ORAL | 0 refills | Status: DC | PRN

## 2020-09-15 MED ORDER — CYCLOBENZAPRINE HCL 5 MG PO TABS: 5.0000 mg | ORAL_TABLET | Freq: Two times a day (BID) | ORAL | 0 refills | Status: DC | PRN

## 2020-09-15 MED ORDER — DEXAMETHASONE SODIUM PHOSPHATE 10 MG/ML IJ SOLN
10.0000 mg | Freq: Once | INTRAMUSCULAR | Status: AC
  Administered 2020-09-15: 10 mg via INTRAVENOUS
  Filled 2020-09-15: qty 1

## 2020-09-15 MED ORDER — LACTATED RINGERS IV BOLUS
1000.0000 mL | Freq: Once | INTRAVENOUS | Status: AC
  Administered 2020-09-15: 1000 mL via INTRAVENOUS

## 2020-09-15 NOTE — Telephone Encounter (Signed)
Patient notified of negative COVID results. She reports still feeling "well." She has not picked up her Rx sent to her pharmacy. Encouraged to pick up the Rx's and take them as directed. Patient verbalized an understanding of the plan of care and agrees.   Raelyn Mora, CNM

## 2020-09-15 NOTE — MAU Provider Note (Signed)
History     CSN: [id number]  Arrival date and time: 09/15/20 0749   Event Date/Time   First Provider Initiated Contact with Patient 09/15/20 [phone]      Chief Complaint  Patient presents with  . Headache  . Nausea  . Shortness of Breath   [name] is a 30 y.o. year old [id number] female at [redacted]w[redacted]d weeks gestation who presents to MAU reporting on-going H/A (rated 5/10) since (+) HPT, but continuous since yesterday with no relief from Tylenol, nausea and feeling like she can't catch her breath since (+) HPT. She reports that she has a h/o migraines, but none since high school. She states that the on-going H/As in pregnancy are usually relieved with Tylenol. She reports that SOB has been "for a while only with activity." She also reports that her daughter tested (+) COVID on 08/29/2020 and her test was negative on 08/30/2020. She also complains of constipation. She recently received [facility] and is unsure where to seek [facility].   OB History    Gravida  4   Para  2   Term  2   Preterm      AB      Living  2     SAB      IAB      Ectopic      Multiple      Live Births  2           Past Medical History:  Diagnosis Date  . Headache   . Medical history non-contributory     Past Surgical History:  Procedure Laterality Date  . FOREIGN BODY REMOVAL Left 09/18/2015   Procedure: FOREIGN BODY REMOVAL OF BIRTH CONTROL DEVICE IN LEFT ARM ADULT;  Surgeon: Luretha Murphy, MD;  Location: [facility];  Service: General;  Laterality: Left;  . NO PAST SURGERIES      Family History  Adopted: Yes  Problem Relation Age of Onset  . Other Neg Hx     Social History   Tobacco Use  . Smoking status: Never Smoker  . Smokeless tobacco: Never Used  Vaping Use  . Vaping Use: Never used  Substance Use Topics  . Alcohol use: No  . Drug use: No    Allergies:  Allergies  Allergen Reactions  . Tramadol Nausea And Vomiting    Medications Prior to  Admission  Medication Sig Dispense Refill Last Dose  . levonorgestrel (MIRENA, 52 MG,) 20 MCG/24HR IUD 1 each by Intrauterine route once.     . metroNIDAZOLE (FLAGYL) 500 MG tablet Take 1 tablet (500 mg total) by mouth 2 (two) times daily. 14 tablet 0     Review of Systems  Constitutional: Positive for appetite change.  HENT: Negative.   Eyes: Negative.   Respiratory: Positive for shortness of breath ("hard to catch her breath with activity").   Gastrointestinal: Positive for constipation, nausea and vomiting.  Endocrine: Negative.   Genitourinary: Negative.   Musculoskeletal: Negative.   Skin: Negative.   Allergic/Immunologic: Negative.   Neurological: Negative.   Hematological: Negative.   Psychiatric/Behavioral: Negative.    Physical Exam   Blood pressure 124/71, pulse 89, temperature 98.6 F (37 C), temperature source Oral, resp. rate 20, height 5\' 4"  (1.626 m), weight 54.8 kg, last menstrual period 07/05/2020, SpO2 100 %, unknown if currently breastfeeding.  Physical Exam Vitals and nursing note reviewed.  Constitutional:      Appearance: Normal appearance. She is normal weight.  HENT:  Head: Normocephalic and atraumatic.     Nose: Nose normal.  Eyes:     Pupils: Pupils are equal, round, and reactive to light.  Cardiovascular:     Rate and Rhythm: Normal rate.     Pulses: Normal pulses.     Heart sounds: Normal heart sounds.  Pulmonary:     Effort: Pulmonary effort is normal.     Breath sounds: Normal breath sounds.  Abdominal:     General: Abdomen is flat.     Palpations: Abdomen is soft.  Genitourinary:    Comments: Not indicated Musculoskeletal:        General: Normal range of motion.     Cervical back: Normal range of motion.  Skin:    General: Skin is warm and dry.  Neurological:     Mental Status: She is alert and oriented to person, place, and time.  Psychiatric:        Mood and Affect: Mood normal.        Behavior: Behavior normal.         Thought Content: Thought content normal.        Judgment: Judgment normal.    MAU Course  Procedures  MDM CCUA UPT COVID swab LR 1000 ml bolus Received migraine headache cocktail (IVFs with benadryl 25 mg IVP, metoclopramide 10 mg IVP, Decadron 10 mg IVP) -- reports relief.  Results for orders placed or performed during the hospital encounter of 09/15/20 (from the past 24 hour(s))  Pregnancy, urine POC     Status: Abnormal   Collection Time: 09/15/20  8:03 AM  Result Value Ref Range   Preg Test, Ur POSITIVE (A) NEGATIVE  Urinalysis, Routine w reflex microscopic Urine, Clean Catch     Status: Abnormal   Collection Time: 09/15/20  8:07 AM  Result Value Ref Range   Color, Urine YELLOW YELLOW   APPearance HAZY (A) CLEAR   Specific Gravity, Urine 1.025 1.005 - 1.030   pH 6.0 5.0 - 8.0   Glucose, UA NEGATIVE NEGATIVE mg/dL   Hgb urine dipstick NEGATIVE NEGATIVE   Bilirubin Urine NEGATIVE NEGATIVE   Ketones, ur NEGATIVE NEGATIVE mg/dL   Protein, ur NEGATIVE NEGATIVE mg/dL   Nitrite NEGATIVE NEGATIVE   Leukocytes,Ua NEGATIVE NEGATIVE  SARS CORONAVIRUS 2 (TAT 6-24 HRS) Nasopharyngeal Nasopharyngeal Swab     Status: None   Collection Time: 09/15/20  8:33 AM   Specimen: Nasopharyngeal Swab  Result Value Ref Range   SARS Coronavirus 2 NEGATIVE NEGATIVE    Assessment and Plan  1. Headache in pregnancy, antepartum, first trimester - Rx for Flexeril 5 mg BID; may take 2 tabs BID prn H/A - Information provided on H/A form and general H/A without cause   2. Constipation during pregnancy in first trimester - Rx Miralax - Information provided on constipation   3. Nausea/vomiting in pregnancy - Rx for Phenergan 12.5 mg every 6 hours prn - Information provided on morning sickness   - Discharge patient - List of [facility] Providers given - Patient verbalized an understanding of the plan of care and agrees.   *Patient notified by phone of her negative COVID results     Raelyn Mora,  CNM 09/15/2020, 10:12 AM

## 2020-09-15 NOTE — Discharge Instructions (Signed)
Center for [facility] Prenatal Care Providers          Center for [facility] Healthcare locations:  Hours may vary. Please call for an appointment  Center for [facility] @ [facility]  [address] (corner of [address])  [phone]  Center for [facility] @ [facility]   [address]  [phone]  Center For [facility] @ [facility]       [address] [phone]            Center for [facility] @ [place]     [phone]          Center for [facility] @ [facility]   [address] [phone]  Center for [facility] @ [facility]  [address] [place] [phone]     Center for [facility] @ [facility]  [address]   [phone]  Safe Medications in Pregnancy   Acne: Benzoyl Peroxide Salicylic Acid  Backache/Headache: Tylenol: 2 regular strength every 4 hours OR              2 Extra strength every 6 hours  Colds/Coughs/Allergies: Benadryl (alcohol free) 25 mg every 6 hours as needed Breath right strips Claritin Cepacol throat lozenges Chloraseptic throat spray Cold-Eeze- up to three times per day Cough drops, alcohol free Flonase (by prescription only) Guaifenesin Mucinex Robitussin DM (plain only, alcohol free) Saline nasal spray/drops Sudafed (pseudoephedrine) & Actifed ** use only after [redacted] weeks gestation and if you do not have high blood pressure Tylenol Vicks Vaporub Zinc lozenges Zyrtec   Constipation: Colace Ducolax suppositories Fleet enema Glycerin suppositories Metamucil Milk of magnesia Miralax Senokot Smooth move tea  Diarrhea: Kaopectate Imodium A-D  *NO pepto Bismol  Hemorrhoids: Anusol Anusol HC Preparation H Tucks  Indigestion: Tums Maalox Mylanta Zantac  Pepcid  Insomnia: Benadryl (alcohol free) 25mg  every 6 hours as  needed Tylenol PM Unisom, no Gelcaps  Leg Cramps: Tums MagGel  Nausea/Vomiting:  Bonine Dramamine Emetrol Ginger extract Sea bands Meclizine  Nausea medication to take during pregnancy:  Unisom (doxylamine succinate 25 mg tablets) Take one tablet daily at bedtime. If symptoms are not adequately controlled, the dose can be increased to a maximum recommended dose of two tablets daily (1/2 tablet in the morning, 1/2 tablet mid-afternoon and one at bedtime). Vitamin B6 100mg  tablets. Take one tablet twice a day (up to 200 mg per day).  Skin Rashes: Aveeno products Benadryl cream or 25mg  every 6 hours as needed Calamine Lotion 1% cortisone cream  Yeast infection: Gyne-lotrimin 7 Monistat 7   **If taking multiple medications, please check labels to avoid duplicating the same active ingredients **take medication as directed on the label ** Do not exceed 4000 mg of tylenol in 24 hours **Do not take medications that contain aspirin or ibuprofen

## 2020-09-15 NOTE — MAU Note (Signed)
Presents with c/o continuous H/A and nausea.  States has taken Tylenol for H/A, but no relief, last took @ 0230 this morning.  Also states can't catch her breath.  Denies VB.  LMP 07/05/2020.

## 2020-09-15 NOTE — Telephone Encounter (Signed)
TC to notify patient of results. No answer LVM to call MAU provider.

## 2020-10-14 DIAGNOSIS — Z862 Personal history of diseases of the blood and blood-forming organs and certain disorders involving the immune mechanism: Secondary | ICD-10-CM

## 2020-10-14 HISTORY — DX: Personal history of diseases of the blood and blood-forming organs and certain disorders involving the immune mechanism: Z86.2

## 2020-10-14 HISTORY — PX: DILATION AND CURETTAGE OF UTERUS: SHX78

## 2020-10-17 HISTORY — PX: DILATION AND CURETTAGE, DIAGNOSTIC / THERAPEUTIC: SUR384

## 2020-12-11 ENCOUNTER — Encounter (HOSPITAL_COMMUNITY): Payer: Self-pay | Admitting: *Deleted

## 2020-12-11 ENCOUNTER — Emergency Department (HOSPITAL_COMMUNITY)
Admission: EM | Admit: 2020-12-11 | Discharge: 2020-12-11 | Disposition: A | Discharge status: Home / Self Care | Payer: Medicaid Other | Attending: Emergency Medicine | Admitting: Emergency Medicine

## 2020-12-11 ENCOUNTER — Other Ambulatory Visit: Payer: Self-pay

## 2020-12-11 DIAGNOSIS — Z20822 Contact with and (suspected) exposure to covid-19: Secondary | ICD-10-CM | POA: Insufficient documentation

## 2020-12-11 DIAGNOSIS — J029 Acute pharyngitis, unspecified: Secondary | ICD-10-CM | POA: Diagnosis not present

## 2020-12-11 LAB — RESP PANEL BY RT-PCR (FLU A&B, COVID) ARPGX2
Influenza A by PCR: NEGATIVE
Influenza B by PCR: NEGATIVE
SARS Coronavirus 2 by RT PCR: NEGATIVE

## 2020-12-11 LAB — GROUP A STREP BY PCR: Group A Strep by PCR: NOT DETECTED

## 2020-12-11 MED ORDER — IBUPROFEN 800 MG PO TABS
800.0000 mg | ORAL_TABLET | Freq: Once | ORAL | Status: AC
  Administered 2020-12-11: 800 mg via ORAL
  Filled 2020-12-11: qty 1

## 2020-12-11 MED ORDER — AMOXICILLIN 500 MG PO CAPS: 500.0000 mg | ORAL_CAPSULE | Freq: Two times a day (BID) | ORAL | 0 refills | Status: AC

## 2020-12-11 NOTE — ED Provider Notes (Signed)
[facility]-EMERGENCY DEPT Provider Note   CSN: [id number] Arrival date & time: 12/11/20  8101     History Chief Complaint  Patient presents with  . Sore Throat  . Headache    Nicole Howard is a 30 y.o. female who presents with concern for sore throat and headache that started yesterday.  She endorses single episode of NBNB emesis last night.  Denies any nausea today denies abdominal pain, vomiting, diarrhea, fevers, chills at home.  She is able to swallow her secretions and tolerate p.o. though she does have some discomfort with swallowing.  Denies any change in her voice, denies any chest pain or shortness of breath.  I personally reviewed this patient's medical records.  She has been vaccinated with COVID-19 has not been vaccinated as to flu.  HPI     Past Medical History:  Diagnosis Date  . Headache   . Medical history non-contributory     Patient Active Problem List   Diagnosis Date Noted  . Active labor 06/01/2012  . Normal delivery 06/01/2012    Past Surgical History:  Procedure Laterality Date  . FOREIGN BODY REMOVAL Left 09/18/2015   Procedure: FOREIGN BODY REMOVAL OF BIRTH CONTROL DEVICE IN LEFT ARM ADULT;  Surgeon: Luretha Murphy, MD;  Location: [facility];  Service: General;  Laterality: Left;  . NO PAST SURGERIES       OB History    Gravida  4   Para  2   Term  2   Preterm      AB      Living  2     SAB      IAB      Ectopic      Multiple      Live Births  2           Family History  Adopted: Yes  Problem Relation Age of Onset  . Other Neg Hx     Social History   Tobacco Use  . Smoking status: Never Smoker  . Smokeless tobacco: Never Used  Vaping Use  . Vaping Use: Never used  Substance Use Topics  . Alcohol use: No  . Drug use: No    Home Medications Prior to Admission medications   Medication Sig Start Date End Date Taking? Authorizing Provider  amoxicillin (AMOXIL) 500 MG  capsule Take 1 capsule (500 mg total) by mouth 2 (two) times daily for 10 days. 12/11/20 12/21/20 Yes Damarri Rampy, Lupe Carney R, PA-C  cyclobenzaprine (FLEXERIL) 5 MG tablet Take 1 tablet (5 mg total) by mouth 2 (two) times daily as needed for muscle spasms. May take up to 2 every 12 hours 09/15/20   Raelyn Mora, CNM  polyethylene glycol (MIRALAX / GLYCOLAX) 17 g packet Take 17 g by mouth daily. 09/15/20   Raelyn Mora, CNM  promethazine (PHENERGAN) 12.5 MG tablet Take 1 tablet (12.5 mg total) by mouth every 6 (six) hours as needed for nausea or vomiting. May insert vaginally, if unable to keep it down 09/15/20   Raelyn Mora, CNM    Allergies    Tramadol  Review of Systems   Review of Systems  Constitutional: Negative.   HENT: Positive for sore throat. Negative for congestion, trouble swallowing and voice change.   Respiratory: Negative.   Cardiovascular: Negative.   Gastrointestinal: Positive for vomiting. Negative for abdominal pain, blood in stool, constipation and nausea.  Genitourinary: Negative.   Musculoskeletal: Negative.   Neurological: Positive for headaches. Negative for dizziness,  tremors, syncope, weakness and light-headedness.    Physical Exam Updated Vital Signs BP 128/85 (BP Location: Left Arm)   Pulse 94   Temp (!) 100.8 F (38.2 C)   Resp 18   LMP 12/03/2020   SpO2 100%   Breastfeeding Unknown   Physical Exam Vitals and nursing note reviewed.  Constitutional:      Appearance: She is not toxic-appearing.  HENT:     Head: Normocephalic and atraumatic.     Nose: Nose normal.     Mouth/Throat:     Mouth: Mucous membranes are moist.     Pharynx: Oropharynx is clear. Uvula midline. Posterior oropharyngeal erythema present. No oropharyngeal exudate.     Tonsils: Tonsillar exudate present. 2+ on the right. 2+ on the left.     Comments: Scant exudate of the left tonsil Eyes:     General: Lids are normal. Vision grossly intact.        Right eye: No discharge.         Left eye: No discharge.     Extraocular Movements: Extraocular movements intact.     Conjunctiva/sclera: Conjunctivae normal.     Pupils: Pupils are equal, round, and reactive to light.  Neck:     Trachea: Trachea and phonation normal.  Cardiovascular:     Rate and Rhythm: Normal rate and regular rhythm.     Pulses: Normal pulses.     Heart sounds: Normal heart sounds. No murmur heard.   Pulmonary:     Effort: Pulmonary effort is normal. No tachypnea, bradypnea, accessory muscle usage, prolonged expiration or respiratory distress.     Breath sounds: Normal breath sounds. No wheezing or rales.  Chest:     Chest wall: No mass, lacerations, deformity, swelling, tenderness, crepitus or edema.  Abdominal:     General: Bowel sounds are normal. There is no distension.     Palpations: Abdomen is soft.     Tenderness: There is no abdominal tenderness. There is no right CVA tenderness, left CVA tenderness, guarding or rebound.  Musculoskeletal:        General: No deformity.     Cervical back: Normal range of motion and neck supple. No rigidity, tenderness or crepitus. No pain with movement, spinous process tenderness or muscular tenderness.     Right lower leg: No edema.     Left lower leg: No edema.  Lymphadenopathy:     Cervical: Cervical adenopathy present.     Right cervical: Superficial cervical adenopathy present.     Left cervical: Superficial cervical adenopathy present.  Skin:    General: Skin is warm and dry.  Neurological:     General: No focal deficit present.     Mental Status: She is alert and oriented to person, place, and time. Mental status is at baseline.     Sensory: Sensation is intact.     Motor: Motor function is intact.     Gait: Gait is intact.  Psychiatric:        Mood and Affect: Mood normal.     ED Results / Procedures / Treatments   Labs (all labs ordered are listed, but only abnormal results are displayed) Labs Reviewed  GROUP A STREP BY PCR   RESP PANEL BY RT-PCR (FLU A&B, COVID) [id number]    EKG None  Radiology No results found.  Procedures Procedures   Medications Ordered in ED Medications  ibuprofen (ADVIL) tablet 800 mg (800 mg Oral Given 12/11/20 0818)    ED Course  I  have reviewed the triage vital signs and the nursing notes.  Pertinent labs & imaging results that were available during my care of the patient were reviewed by me and considered in my medical decision making (see chart for details).    MDM Rules/Calculators/A&P                          30 year old female presents with 24 hours of sore throat and headache.  Tolerating p.o.  Differential diagnosis for this patient includes but is not limited to streptococcal pharyngitis, mononucleosis, laryngitis, other viral or bacterial pharyngitis, GERD, sinusitis postnasal drainage.  Patient was febrile on intake 100.8 F.  Vital signs otherwise normal.  Cardiopulmonary symptoms, abdominal exam is benign.  HEENT exam revealed erythemtatous, erythematous tonsils with scant exudate on the left tonsil.  Superficial cervical lymphadenopathy bilaterally.  No concern for airway compromise on physical exam.  Group A strep test negative in triage.  Respiratory pathogen panel pending.  Physical exam concerning for possible bacterial infection of the throat.  Patient declined Monospot testing.  Will treat with antibiotic course to cover for group A strep, and have patient follow-up on her respiratory pathogen panel in the MyChart app.  Josalyn voiced understanding of her medical evaluation and treatment plan.  Her questions was answered to her expressed satisfaction.  Return precautions given.  Patient is stable and appropriate for discharge at this time.  This chart was dictated using voice recognition software, Dragon. Despite the best efforts of this provider to proofread and correct errors, errors may still occur which can change documentation meaning.  Final Clinical  Impression(s) / ED Diagnoses Final diagnoses:  Pharyngitis, unspecified etiology    Rx / DC Orders ED Discharge Orders         Ordered    amoxicillin (AMOXIL) 500 MG capsule  2 times daily        12/11/20 0840           Krisa Blattner, Eugene Gavia, PA-C 12/11/20 2831    Gwyneth Sprout, MD 12/14/20 [phone]

## 2020-12-11 NOTE — Discharge Instructions (Addendum)
You were seen in the emergency department today for your sore throat and headache.  Your strep test was negative, however your physical exam was concerning for possible bacterial infection of your throat.  You have been prescribed an antibiotic to take for the next 10 days.  Additionally you may follow-up on your COVID and flu test in your MyChart app.  Return to the emergency department if you develop any nausea or vomiting does not stop, difficulty swallowing fluids, or any other severe symptoms.

## 2020-12-11 NOTE — ED Triage Notes (Signed)
Pt complains of sore throat and headache since yesterday. No cough, fever, or body aches.

## 2021-01-10 ENCOUNTER — Other Ambulatory Visit: Payer: Self-pay

## 2021-01-10 ENCOUNTER — Emergency Department (HOSPITAL_COMMUNITY)
Admission: EM | Admit: 2021-01-10 | Discharge: 2021-01-10 | Disposition: A | Discharge status: Home / Self Care | Payer: Medicaid Other | Attending: Emergency Medicine | Admitting: Emergency Medicine

## 2021-01-10 ENCOUNTER — Encounter (HOSPITAL_COMMUNITY): Payer: Self-pay | Admitting: Emergency Medicine

## 2021-01-10 ENCOUNTER — Emergency Department (HOSPITAL_COMMUNITY): Payer: Medicaid Other

## 2021-01-10 DIAGNOSIS — B9689 Other specified bacterial agents as the cause of diseases classified elsewhere: Secondary | ICD-10-CM | POA: Insufficient documentation

## 2021-01-10 DIAGNOSIS — R102 Pelvic and perineal pain: Secondary | ICD-10-CM

## 2021-01-10 DIAGNOSIS — N760 Acute vaginitis: Secondary | ICD-10-CM | POA: Insufficient documentation

## 2021-01-10 DIAGNOSIS — N739 Female pelvic inflammatory disease, unspecified: Secondary | ICD-10-CM | POA: Diagnosis not present

## 2021-01-10 DIAGNOSIS — N730 Acute parametritis and pelvic cellulitis: Secondary | ICD-10-CM

## 2021-01-10 LAB — CBC WITH DIFFERENTIAL/PLATELET
Abs Immature Granulocytes: 0.01 10*3/uL (ref 0.00–0.07)
Basophils Absolute: 0.1 10*3/uL (ref 0.0–0.1)
Basophils Relative: 1 %
Eosinophils Absolute: 0.1 10*3/uL (ref 0.0–0.5)
Eosinophils Relative: 1 %
HCT: 34.3 % — ABNORMAL LOW (ref 36.0–46.0)
Hemoglobin: 11.1 g/dL — ABNORMAL LOW (ref 12.0–15.0)
Immature Granulocytes: 0 %
Lymphocytes Relative: 26 %
Lymphs Abs: 2.3 10*3/uL (ref 0.7–4.0)
MCH: 28.9 pg (ref 26.0–34.0)
MCHC: 32.4 g/dL (ref 30.0–36.0)
MCV: 89.3 fL (ref 80.0–100.0)
Monocytes Absolute: 1.1 10*3/uL — ABNORMAL HIGH (ref 0.1–1.0)
Monocytes Relative: 12 %
Neutro Abs: 5.5 10*3/uL (ref 1.7–7.7)
Neutrophils Relative %: 60 %
Platelets: 270 10*3/uL (ref 150–400)
RBC: 3.84 MIL/uL — ABNORMAL LOW (ref 3.87–5.11)
RDW: 14 % (ref 11.5–15.5)
WBC: 9.1 10*3/uL (ref 4.0–10.5)
nRBC: 0 % (ref 0.0–0.2)

## 2021-01-10 LAB — LIPASE, BLOOD: Lipase: 34 U/L (ref 11–51)

## 2021-01-10 LAB — I-STAT BETA HCG BLOOD, ED (MC, WL, AP ONLY): I-stat hCG, quantitative: <5 m[IU]/mL (ref <5)

## 2021-01-10 LAB — COMPREHENSIVE METABOLIC PANEL
ALT: 21 U/L (ref 0–44)
AST: 20 U/L (ref 15–41)
Albumin: 3.8 g/dL (ref 3.5–5.0)
Alkaline Phosphatase: 57 U/L (ref 38–126)
Anion gap: 7 (ref 5–15)
BUN: 7 mg/dL (ref 6–20)
CO2: 26 mmol/L (ref 22–32)
Calcium: 8.9 mg/dL (ref 8.9–10.3)
Chloride: 105 mmol/L (ref 98–111)
Creatinine, Ser: 0.79 mg/dL (ref 0.44–1.00)
GFR, Estimated: >60 mL/min (ref >60)
Glucose, Bld: 92 mg/dL (ref 70–99)
Potassium: 3.8 mmol/L (ref 3.5–5.1)
Sodium: 138 mmol/L (ref 135–145)
Total Bilirubin: 1 mg/dL (ref 0.3–1.2)
Total Protein: 7 g/dL (ref 6.5–8.1)

## 2021-01-10 LAB — URINALYSIS, ROUTINE W REFLEX MICROSCOPIC
Bacteria, UA: NONE SEEN
Bilirubin Urine: NEGATIVE
Glucose, UA: NEGATIVE mg/dL
Hgb urine dipstick: NEGATIVE
Ketones, ur: NEGATIVE mg/dL
Nitrite: NEGATIVE
Protein, ur: NEGATIVE mg/dL
Specific Gravity, Urine: 1.019 (ref 1.005–1.030)
pH: 7 (ref 5.0–8.0)

## 2021-01-10 LAB — WET PREP, GENITAL
Sperm: NONE SEEN
Trich, Wet Prep: NONE SEEN
Yeast Wet Prep HPF POC: NONE SEEN

## 2021-01-10 LAB — RPR: RPR Ser Ql: NONREACTIVE

## 2021-01-10 LAB — HIV ANTIBODY (ROUTINE TESTING W REFLEX): HIV Screen 4th Generation wRfx: NONREACTIVE

## 2021-01-10 MED ORDER — MORPHINE SULFATE (PF) 4 MG/ML IV SOLN
4.0000 mg | Freq: Once | INTRAVENOUS | Status: AC
  Administered 2021-01-10: 4 mg via INTRAVENOUS
  Filled 2021-01-10: qty 1

## 2021-01-10 MED ORDER — STERILE WATER FOR INJECTION IJ SOLN
INTRAMUSCULAR | Status: AC
  Administered 2021-01-10: 2.1 mL
  Filled 2021-01-10: qty 10

## 2021-01-10 MED ORDER — CEFTRIAXONE SODIUM 1 G IJ SOLR
1.0000 g | Freq: Once | INTRAMUSCULAR | Status: AC
  Administered 2021-01-10: 1 g via INTRAMUSCULAR
  Filled 2021-01-10: qty 10

## 2021-01-10 MED ORDER — ONDANSETRON HCL 4 MG/2ML IJ SOLN
4.0000 mg | Freq: Once | INTRAMUSCULAR | Status: AC
Start: 2021-01-10 — End: 2021-01-10
  Administered 2021-01-10: 4 mg via INTRAVENOUS
  Filled 2021-01-10: qty 2

## 2021-01-10 MED ORDER — SODIUM CHLORIDE 0.9 % IV BOLUS
1000.0000 mL | Freq: Once | INTRAVENOUS | Status: AC
  Administered 2021-01-10: 1000 mL via INTRAVENOUS

## 2021-01-10 MED ORDER — METRONIDAZOLE 500 MG PO TABS: 500.0000 mg | ORAL_TABLET | Freq: Two times a day (BID) | ORAL | 0 refills | Status: AC

## 2021-01-10 MED ORDER — DOXYCYCLINE HYCLATE 100 MG PO CAPS: 100.0000 mg | ORAL_CAPSULE | Freq: Two times a day (BID) | ORAL | 0 refills | Status: AC

## 2021-01-10 NOTE — ED Triage Notes (Signed)
Patient complains of abdominal pain for the last week, reports history of miscarriage and D&C in March this year. Patient alert, oriented, and in no apparent distress at this time.

## 2021-01-10 NOTE — Discharge Instructions (Addendum)
Your exam today was consistent with possible pelvic inflammatory disease.  We are treating this with antibiotics.  May take additional Tylenol and ibuprofen.  Ultrasound did not show any significant abnormality which is reassuring  It is important to follow-up with an OB/GYN.  We have referred you to one at discharge.  Return for any new or worsening symptoms peer

## 2021-01-10 NOTE — ED Provider Notes (Signed)
[facility] EMERGENCY DEPARTMENT Provider Note   CSN: [id number] Arrival date & time: 01/10/21  0807    History Abd pain   Nicole Howard is a 30 y.o. female with past medical history significant [facility] with post [facility] with subsequent DIC at [facility] on 10/14/20 who presents for evaluation of pelvic pain. Has had persistent lower abd pain since d&c. Has not followed with OB post TAB. Pain located to suprapubic region.  Does not radiate into the left lower quadrant or right lower quadrant.  She denies any dysuria or hematuria.  Feels like a aching sensation.  She does admit to increased vaginal discharge which has been abnormal for her over the last week.  Has prior history of trichomonas.  She is sexually active. No current vaginal bleeding.  She denies fever, chills, nausea, vomiting, flank pain, weakness.  Denies additional aggravating or alleviating factors. Rates pain a 6/10. Denies additional aggravating or alleviating factors.  History obtained from patient and past medical records. No interpretor was used.  HPI     Past Medical History:  Diagnosis Date  . Headache   . Medical history non-contributory     Patient Active Problem List   Diagnosis Date Noted  . Active labor 06/01/2012  . Normal delivery 06/01/2012    Past Surgical History:  Procedure Laterality Date  . DILATION AND CURETTAGE, DIAGNOSTIC / THERAPEUTIC  10/2020  . FOREIGN BODY REMOVAL Left 09/18/2015   Procedure: FOREIGN BODY REMOVAL OF BIRTH CONTROL DEVICE IN LEFT ARM ADULT;  Surgeon: Luretha Murphy, MD;  Location:  SURGERY CENTER;  Service: General;  Laterality: Left;  . NO PAST SURGERIES       OB History    Gravida  4   Para  2   Term  2   Preterm      AB      Living  2     SAB      IAB      Ectopic      Multiple      Live Births  2           Family History  Adopted: Yes  Problem Relation Age of Onset  . Other Neg  Hx     Social History   Tobacco Use  . Smoking status: Never Smoker  . Smokeless tobacco: Never Used  Vaping Use  . Vaping Use: Never used  Substance Use Topics  . Alcohol use: No  . Drug use: No    Home Medications Prior to Admission medications   Medication Sig Start Date End Date Taking? Authorizing Provider  doxycycline (VIBRAMYCIN) 100 MG capsule Take 1 capsule (100 mg total) by mouth 2 (two) times daily for 14 days. 01/10/21 01/24/21 Yes Kortez Murtagh A, PA-C  metroNIDAZOLE (FLAGYL) 500 MG tablet Take 1 tablet (500 mg total) by mouth 2 (two) times daily for 14 days. 01/10/21 01/24/21 Yes Wilmarie Sparlin A, PA-C  cyclobenzaprine (FLEXERIL) 5 MG tablet Take 1 tablet (5 mg total) by mouth 2 (two) times daily as needed for muscle spasms. May take up to 2 every 12 hours 09/15/20   Raelyn Mora, CNM  polyethylene glycol (MIRALAX / GLYCOLAX) 17 g packet Take 17 g by mouth daily. 09/15/20   Raelyn Mora, CNM  promethazine (PHENERGAN) 12.5 MG tablet Take 1 tablet (12.5 mg total) by mouth every 6 (six) hours as needed for nausea or vomiting. May insert vaginally, if unable to keep  it down 09/15/20   Raelyn Mora, CNM    Allergies    Tramadol  Review of Systems   Review of Systems  Constitutional: Negative.   HENT: Negative.   Respiratory: Negative.   Cardiovascular: Negative.   Gastrointestinal: Positive for abdominal pain. Negative for abdominal distention, anal bleeding, diarrhea, nausea, rectal pain and vomiting.  Genitourinary: Positive for vaginal discharge. Negative for decreased urine volume, difficulty urinating, dysuria, flank pain, frequency, genital sores, hematuria, menstrual problem, urgency, vaginal bleeding and vaginal pain.  Musculoskeletal: Negative.   Skin: Negative.   Neurological: Negative.   All other systems reviewed and are negative.   Physical Exam Updated Vital Signs BP 120/78   Pulse (!) 50   Temp 98.2 F (36.8 C) (Oral)   Resp 16   LMP  07/05/2020   SpO2 100%   Physical Exam Vitals and nursing note reviewed.  Constitutional:      General: She is not in acute distress.    Appearance: She is well-developed. She is not ill-appearing, toxic-appearing or diaphoretic.  HENT:     Head: Normocephalic and atraumatic.     Nose: Nose normal.     Mouth/Throat:     Mouth: Mucous membranes are moist.  Eyes:     Pupils: Pupils are equal, round, and reactive to light.  Cardiovascular:     Rate and Rhythm: Normal rate.     Pulses: Normal pulses.     Heart sounds: Normal heart sounds.  Pulmonary:     Effort: Pulmonary effort is normal. No respiratory distress.     Breath sounds: Normal breath sounds.  Abdominal:     General: Bowel sounds are normal. There is no distension.     Palpations: Abdomen is soft.     Tenderness: There is abdominal tenderness in the suprapubic area. There is no right CVA tenderness, left CVA tenderness, guarding or rebound.     Hernia: No hernia is present.    Genitourinary:    Comments: Normal appearing external female genitalia without rashes or lesions, normal vaginal epithelium. Normal appearing cervix with white, light yellow foamy discharge. No cervical petechiae. Cervical os is closed. There is  bleeding noted at the os. No odor. Bimanual: Moderate CMT.  No palpable adnexal masses or tenderness. Uterus midline and not fixed. Rectovaginal exam was deferred.  No cystocele or rectocele noted. No pelvic lymphadenopathy noted. Wet prep was obtained.  Cultures for gonorrhea and chlamydia collected. Exam performed with chaperone in room. Musculoskeletal:        General: Normal range of motion.     Cervical back: Normal range of motion.  Skin:    General: Skin is warm and dry.     Capillary Refill: Capillary refill takes less than 2 seconds.  Neurological:     General: No focal deficit present.     Mental Status: She is alert and oriented to person, place, and time.     ED Results / Procedures /  Treatments   Labs (all labs ordered are listed, but only abnormal results are displayed) Labs Reviewed  WET PREP, GENITAL - Abnormal; Notable for the following components:      Result Value   Clue Cells Wet Prep HPF POC PRESENT (*)    WBC, Wet Prep HPF POC MANY (*)    All other components within normal limits  CBC WITH DIFFERENTIAL/PLATELET - Abnormal; Notable for the following components:   RBC 3.84 (*)    Hemoglobin 11.1 (*)    HCT 34.3 (*)  Monocytes Absolute 1.1 (*)    All other components within normal limits  URINALYSIS, ROUTINE W REFLEX MICROSCOPIC - Abnormal; Notable for the following components:   APPearance CLOUDY (*)    Leukocytes,Ua TRACE (*)    All other components within normal limits  URINE CULTURE  COMPREHENSIVE METABOLIC PANEL  LIPASE, BLOOD  RPR  HIV ANTIBODY (ROUTINE TESTING W REFLEX)  I-STAT BETA HCG BLOOD, ED ([facility], [facility], [facility] ONLY)  GC/CHLAMYDIA PROBE AMP ([place]    EKG None  Radiology US PELVIC COMPLETE W TRANSVAGINAL AND TORSION R/O  Result Date: 01/10/2021 CLINICAL DATA:  30 year old with pelvic pain. EXAM: TRANSABDOMINAL AND TRANSVAGINAL ULTRASOUND OF PELVIS DOPPLER ULTRASOUND OF OVARIES TECHNIQUE: Both transabdominal and transvaginal ultrasound examinations of the pelvis were performed. Transabdominal technique was performed for global imaging of the pelvis including uterus, ovaries, adnexal regions, and pelvic cul-de-sac. It was necessary to proceed with endovaginal exam following the transabdominal exam to visualize the uterus and ovaries. Color and duplex Doppler ultrasound was utilized to evaluate blood flow to the ovaries. COMPARISON:  Pelvic CT 01/04/2017 FINDINGS: Uterus Measurements: 9.9 x 4.8 x 6.6 cm = volume: 164 mL. No fibroids or other mass visualized. Endometrium Thickness: 0.7 cm.  No focal abnormality visualized. Right ovary Measurements: 2.2 x 1.6 x 1.6 cm = volume: 2.9 mL. Normal appearance/no adnexal mass. Left ovary  Measurements: 3.7 x 2.2 x 2.3 cm = volume: 9.8 mL. Normal appearance/no adnexal mass. Pulsed Doppler evaluation of both ovaries demonstrates normal low-resistance arterial and venous waveforms. Other findings No abnormal free fluid. IMPRESSION: Normal pelvic ultrasound. Electronically Signed   By: Richarda OverlieAdam  Henn M.D.   On: 01/10/2021 14:46    Procedures Procedures   Medications Ordered in ED Medications  sodium chloride 0.9 % bolus 1,000 mL (1,000 mLs Intravenous New Bag/Given 01/10/21 0850)  ondansetron (ZOFRAN) injection 4 mg (4 mg Intravenous Given 01/10/21 0851)  morphine 4 MG/ML injection 4 mg (4 mg Intravenous Given 01/10/21 0851)  cefTRIAXone (ROCEPHIN) injection 1 g (1 g Intramuscular Given 01/10/21 1449)  sterile water (preservative free) injection (2.1 mLs  Given 01/10/21 1449)   ED Course  I have reviewed the triage vital signs and the nursing notes.  Pertinent labs & imaging results that were available during my care of the patient were reviewed by me and considered in my medical decision making (see chart for details).  30 year old here for evaluation of lower abdominal pain which is been constant since her D&C with possible postop hemorrhage complicated by DIC. Some vag dc.  She is sexually active.  Unsure of chance of pregnancy.  She is afebrile, nonseptic, non-ill-appearing.  Her heart and lungs are clear.  Her abdomen has some mild suprapubic tenderness.  We will plan on labs, imaging, pelvic exam and reassess  Labs and imaging personally reviewed and interpreted:  CBC without leukocytosis, metabolic panel without electrolyte, renal or liver normality RPR nonreactive HIV nonreactive Pregnancy test negative UA negative for infection US without significant abnormality  GU exam with moderate white/ yellow foamy discharge.  Mild/moderate cervical motion tenderness.  No adnexal tenderness.  Will treat for PID.  Discussed close follow-up with OB/GYN.  I have placed referral as she  would like to follow-up on locally.  Will give Rocephin here, Doxy/Flagyl outpatient.  On repeat exam patient does not have a surgical abdomin and there are no peritoneal signs.  No indication of appendicitis, bowel obstruction, bowel perforation, cholecystitis, diverticulitis, TOA, torsion or ectopic pregnancy.  The patient has been appropriately medically screened and/or stabilized in the ED. I have low suspicion for any other emergent medical condition which would require further screening, evaluation or treatment in the ED or require inpatient management.  Patient is hemodynamically stable and in no acute distress.  Patient able to ambulate in department prior to ED.  Evaluation does not show acute pathology that would require ongoing or additional emergent interventions while in the emergency department or further inpatient treatment.  I have discussed the diagnosis with the patient and answered all questions.  Pain is been managed while in the emergency department and patient has no further complaints prior to discharge.  Patient is comfortable with plan discussed in room and is stable for discharge at this time.  I have discussed strict return precautions for returning to the emergency department.  Patient was encouraged to follow-up with PCP/specialist refer to at discharge.     MDM Rules/Calculators/A&P                           Final Clinical Impression(s) / ED Diagnoses Final diagnoses:  Pelvic pain  PID (acute pelvic inflammatory disease)  Bacterial vaginosis    Rx / DC Orders ED Discharge Orders         Ordered    metroNIDAZOLE (FLAGYL) 500 MG tablet  2 times daily        01/10/21 1432    doxycycline (VIBRAMYCIN) 100 MG capsule  2 times daily        01/10/21 1432    Ambulatory referral to Obstetrics / Gynecology        01/10/21 [address] Lisaanne Lawrie A, PA-C 01/10/21 1455    Jacalyn Lefevre, MD 01/11/21 1720

## 2021-01-11 LAB — URINE CULTURE: Culture: <10000 — AB

## 2021-01-11 LAB — GC/CHLAMYDIA PROBE AMP (~~LOC~~) NOT AT ARMC
Chlamydia: NEGATIVE
Comment: NEGATIVE
Comment: NORMAL
Neisseria Gonorrhea: NEGATIVE

## 2021-02-13 ENCOUNTER — Ambulatory Visit (INDEPENDENT_AMBULATORY_CARE_PROVIDER_SITE_OTHER): Payer: 59

## 2021-02-13 ENCOUNTER — Other Ambulatory Visit: Payer: Self-pay

## 2021-02-13 ENCOUNTER — Encounter: Payer: Self-pay | Admitting: Family Medicine

## 2021-02-13 ENCOUNTER — Other Ambulatory Visit (HOSPITAL_COMMUNITY)
Admission: RE | Admit: 2021-02-13 | Discharge: 2021-02-13 | Disposition: A | Discharge status: Home / Self Care | Payer: 59 | Source: Ambulatory Visit

## 2021-02-13 DIAGNOSIS — R1030 Lower abdominal pain, unspecified: Secondary | ICD-10-CM | POA: Diagnosis not present

## 2021-02-13 DIAGNOSIS — Z124 Encounter for screening for malignant neoplasm of cervix: Secondary | ICD-10-CM | POA: Insufficient documentation

## 2021-02-13 DIAGNOSIS — Z3202 Encounter for pregnancy test, result negative: Secondary | ICD-10-CM

## 2021-02-13 DIAGNOSIS — N739 Female pelvic inflammatory disease, unspecified: Secondary | ICD-10-CM

## 2021-02-13 DIAGNOSIS — N898 Other specified noninflammatory disorders of vagina: Secondary | ICD-10-CM

## 2021-02-13 DIAGNOSIS — F419 Anxiety disorder, unspecified: Secondary | ICD-10-CM

## 2021-02-13 LAB — POCT PREGNANCY, URINE: Preg Test, Ur: NEGATIVE

## 2021-02-13 MED ORDER — DOXYCYCLINE HYCLATE 100 MG PO CAPS: 100.0000 mg | ORAL_CAPSULE | Freq: Two times a day (BID) | ORAL | 0 refills | Status: DC

## 2021-02-13 MED ORDER — ONDANSETRON HCL 4 MG PO TABS: 4.0000 mg | ORAL_TABLET | Freq: Three times a day (TID) | ORAL | 0 refills | Status: DC | PRN

## 2021-02-13 NOTE — Progress Notes (Signed)
Patient is here today because of miscarriage she had in February/March of this year. Patient stated that she was approximately [redacted] weeks pregnant at the time of her miscarriage and that she had no prenatal care prior. Ms Gaumond stated that she is no longer having vaginal bleeding but still has daily abdominal pain. I asked the pt if she would like to speak to a behavior health therapist and patient accepted.  Vitals: BP: 120/86 pulse 81 Weight:117.5 lbs LMP: 01/20/21  Resha Filippone, [facility]  02/13/21 02/13/21

## 2021-02-13 NOTE — Progress Notes (Signed)
  GYNECOLOGY PROGRESS NOTE  History:  30 y.o. [id number] presents to [facility] office today for ED follow up. Patient seen in ED on 01/10/21 and diagnosed with PID. She reports ongoing abdominal/pelvic pain since she had a miscarriage and D&E in February. That was an unplanned pregnancy. She continues to describe pain as an intermittent cramping sensation. Patient reports she was given prescription for 2 antibiotics. Says she completed the metronidazole however only took 2 days of doxycycline because it made her sick. She sometimes takes ibuprofen for cramping, but doesn't always as it sometimes helps with pain. She denies vaginal bleeding, dizziness, urinary s/s, shortness of breath, n/v, or fever/chills. Of note, she last had unprotected intercourse 2-3 weeks ago and does not intend to become pregnant. LMP was 01/20/21  The following portions of the patient's history were reviewed and updated as appropriate: allergies, current medications, past family history, past medical history, past social history, past surgical history and problem list. Last pap smear on 05/08/2015 was NILM.   Health Maintenance Due  Topic Date Due   COVID-19 Vaccine (1) Never done   Hepatitis C Screening  Never done   PAP SMEAR-Modifier  05/07/2018     Review of Systems:  Pertinent items are noted in HPI.   Objective:  Physical Exam BP: 120/86 Pulse: 81 Weight: 117.5lbs LMP: 01/20/21 VS reviewed, nursing note reviewed,  Constitutional: well developed, well nourished, no distress HEENT: normocephalic CV: normal rate Pulm/chest wall: normal effort Breast Exam: deferred Abdomen: soft Neuro: alert and oriented x 3 Skin: warm, dry Psych: affect normal Pelvic exam: cervix pink, visually closed, without lesion, scant white creamy discharge, no vaginal bleeding, vaginal walls and external genitalia normal Bimanual exam: Cervix closed/long/high, firm, anterior, positive CMT, uterus nontender, nonenlarged, adnexa  without tenderness, enlargement, or mass  Assessment & Plan:   1. Anxiety  - Ambulatory referral to Integrated Behavioral Health  2. PID (pelvic inflammatory disease) - Given patient did not complete antibiotics along with +CMT, patient instructed to take doxycycline as previously prescribed. Will send Zofran as well to help with nausea.  - We also discussed contraceptive methods given that she does not intend to become pregnant. Patient declines.  - We discussed importance of using condoms to protect against both pregnancy and STI's.  - doxycycline (VIBRAMYCIN) 100 MG capsule; Take 1 capsule (100 mg total) by mouth 2 (two) times daily.  Dispense: 28 capsule; Refill: 0 - ondansetron (ZOFRAN) 4 MG tablet; Take 1 tablet (4 mg total) by mouth every 8 (eight) hours as needed for nausea or vomiting.  Dispense: 30 tablet; Refill: 0  3. Lower abdominal pain  - Urinalysis, Routine w reflex microscopic - POCT urine pregnancy  4. Screening for cervical cancer  - Cytology - PAP( [place])  5. Vaginal discharge  - Cervicovaginal ancillary only( [place])    Follow up in 4 weeks     Brand Males, CNM 02/14/21 2:03 PM

## 2021-02-14 LAB — CERVICOVAGINAL ANCILLARY ONLY
Bacterial Vaginitis (gardnerella): POSITIVE — AB
Candida Glabrata: NEGATIVE
Candida Vaginitis: NEGATIVE
Comment: NEGATIVE
Comment: NEGATIVE
Comment: NEGATIVE
Comment: NEGATIVE
Trichomonas: NEGATIVE

## 2021-02-14 LAB — URINALYSIS, ROUTINE W REFLEX MICROSCOPIC
Bilirubin, UA: NEGATIVE
Glucose, UA: NEGATIVE
Ketones, UA: NEGATIVE
Leukocytes,UA: NEGATIVE
Nitrite, UA: NEGATIVE
Protein,UA: NEGATIVE
RBC, UA: NEGATIVE
Specific Gravity, UA: 1.024 (ref 1.005–1.030)
Urobilinogen, Ur: 0.2 mg/dL (ref 0.2–1.0)
pH, UA: 7 (ref 5.0–7.5)

## 2021-02-23 LAB — CYTOLOGY - PAP
Comment: NEGATIVE
Comment: NEGATIVE
Diagnosis: NEGATIVE
HPV 16: NEGATIVE
HPV 18 / 45: NEGATIVE
High risk HPV: POSITIVE — AB

## 2021-03-15 ENCOUNTER — Encounter: Payer: Self-pay | Admitting: Family Medicine

## 2021-03-15 ENCOUNTER — Encounter: Payer: 59 | Admitting: Family Medicine

## 2021-03-15 NOTE — Progress Notes (Signed)
Patient did not keep appointment today. She will be called to reschedule.  

## 2021-08-15 ENCOUNTER — Encounter (HOSPITAL_COMMUNITY): Payer: Self-pay | Admitting: Emergency Medicine

## 2021-08-15 ENCOUNTER — Emergency Department (HOSPITAL_COMMUNITY)
Admission: EM | Admit: 2021-08-15 | Discharge: 2021-08-15 | Disposition: A | Discharge status: Home / Self Care | Payer: Medicaid Other | Attending: Emergency Medicine | Admitting: Emergency Medicine

## 2021-08-15 DIAGNOSIS — L509 Urticaria, unspecified: Secondary | ICD-10-CM | POA: Diagnosis not present

## 2021-08-15 DIAGNOSIS — R21 Rash and other nonspecific skin eruption: Secondary | ICD-10-CM | POA: Diagnosis present

## 2021-08-15 LAB — PREGNANCY, URINE: Preg Test, Ur: NEGATIVE

## 2021-08-15 MED ORDER — PREDNISONE 20 MG PO TABS
60.0000 mg | ORAL_TABLET | Freq: Once | ORAL | Status: AC
  Administered 2021-08-15: 60 mg via ORAL
  Filled 2021-08-15: qty 3

## 2021-08-15 MED ORDER — DIPHENHYDRAMINE HCL 25 MG PO CAPS
50.0000 mg | ORAL_CAPSULE | Freq: Once | ORAL | Status: AC
  Administered 2021-08-15: 50 mg via ORAL
  Filled 2021-08-15: qty 2

## 2021-08-15 MED ORDER — FAMOTIDINE 20 MG PO TABS
20.0000 mg | ORAL_TABLET | Freq: Once | ORAL | Status: AC
  Administered 2021-08-15: 20 mg via ORAL
  Filled 2021-08-15: qty 1

## 2021-08-15 MED ORDER — PREDNISONE 20 MG PO TABS
40.0000 mg | ORAL_TABLET | Freq: Every day | ORAL | 0 refills | Status: DC
Start: 2021-08-15 — End: 2021-08-25

## 2021-08-15 MED ORDER — CETIRIZINE HCL 10 MG PO TABS: 10.0000 mg | ORAL_TABLET | Freq: Every day | ORAL | 1 refills | Status: DC

## 2021-08-15 NOTE — ED Provider Notes (Signed)
[facility]-EMERGENCY DEPT Provider Note   CSN: [id number] Arrival date & time: 08/15/21  9518     History Chief Complaint  Patient presents with   Allergic Reaction    Nicole Howard is a 30 y.o. female.  The history is provided by the patient.  Allergic Reaction Presenting symptoms: itching and rash   Presenting symptoms comment:  Started with hives and itching yesterday but this morning when she woke up she reports her throat feels a little bit itchy but denies any trouble breathing or difficulty swallowing Severity:  Moderate Duration:  12 hours Prior allergic episodes:  No prior episodes Context comment:  Unknown Relieved by: Some improvement with Benadryl and last had Benadryl at 11 PM last night. Worsened by:  Nothing Ineffective treatments:  None tried     Past Medical History:  Diagnosis Date   Headache    Medical history non-contributory     Patient Active Problem List   Diagnosis Date Noted   Active labor 06/01/2012   Normal delivery 06/01/2012    Past Surgical History:  Procedure Laterality Date   DILATION AND CURETTAGE, DIAGNOSTIC / THERAPEUTIC  10/2020   FOREIGN BODY REMOVAL Left 09/18/2015   Procedure: FOREIGN BODY REMOVAL OF BIRTH CONTROL DEVICE IN LEFT ARM ADULT;  Surgeon: Luretha Murphy, MD;  Location: [facility];  Service: General;  Laterality: Left;   NO PAST SURGERIES       OB History     Gravida  4   Para  2   Term  2   Preterm      AB      Living  2      SAB      IAB      Ectopic      Multiple      Live Births  2           Family History  Adopted: Yes  Problem Relation Age of Onset   Other Neg Hx     Social History   Tobacco Use   Smoking status: Never   Smokeless tobacco: Never  Vaping Use   Vaping Use: Never used  Substance Use Topics   Alcohol use: No   Drug use: No    Home Medications Prior to Admission medications   Medication Sig Start Date End Date  Taking? Authorizing Provider  acetaminophen (TYLENOL) 325 MG tablet Take by mouth. Patient not taking: Reported on 02/13/2021    [provider]  cyclobenzaprine (FLEXERIL) 5 MG tablet Take 1 tablet (5 mg total) by mouth 2 (two) times daily as needed for muscle spasms. May take up to 2 every 12 hours Patient not taking: Reported on 02/13/2021 09/15/20   Raelyn Mora, CNM  doxycycline (VIBRAMYCIN) 100 MG capsule Take 1 capsule (100 mg total) by mouth 2 (two) times daily. 02/13/21   Brand Males, CNM  ondansetron (ZOFRAN) 4 MG tablet Take 1 tablet (4 mg total) by mouth every 8 (eight) hours as needed for nausea or vomiting. 02/13/21   Brand Males, CNM  polyethylene glycol (MIRALAX / GLYCOLAX) 17 g packet Take 17 g by mouth daily. Patient not taking: Reported on 02/13/2021 09/15/20   Raelyn Mora, CNM  promethazine (PHENERGAN) 12.5 MG tablet Take 1 tablet (12.5 mg total) by mouth every 6 (six) hours as needed for nausea or vomiting. May insert vaginally, if unable to keep it down Patient not taking: Reported on 02/13/2021 09/15/20   Raelyn Mora, CNM  Allergies    Tramadol  Review of Systems   Review of Systems  Skin:  Positive for itching and rash.  All other systems reviewed and are negative.  Physical Exam Updated Vital Signs BP (!) 152/103 (BP Location: Left Arm)    Pulse (!) 105    Temp 98.3 F (36.8 C) (Oral)    Resp 18    SpO2 100%   Physical Exam Vitals and nursing note reviewed.  Constitutional:      General: She is not in acute distress.    Appearance: She is well-developed.  HENT:     Head: Normocephalic and atraumatic.     Mouth/Throat:     Mouth: Mucous membranes are moist.     Pharynx: Oropharynx is clear.     Comments: No edema noted of the tongue or uvula. Eyes:     Pupils: Pupils are equal, round, and reactive to light.  Neck:     Comments: No stridor Cardiovascular:     Rate and Rhythm: Normal rate and regular rhythm.     Heart  sounds: Normal heart sounds. No murmur heard.   No friction rub.  Pulmonary:     Effort: Pulmonary effort is normal.     Breath sounds: Normal breath sounds. No wheezing or rales.  Musculoskeletal:        General: No tenderness. Normal range of motion.     Cervical back: Normal range of motion and neck supple.     Comments: No edema  Skin:    General: Skin is warm and dry.     Findings: Rash present.     Comments: Sparse raised papules consistent with urticaria over the extremities and trunk  Neurological:     Mental Status: She is alert and oriented to person, place, and time. Mental status is at baseline.     Cranial Nerves: No cranial nerve deficit.  Psychiatric:        Mood and Affect: Mood normal.        Behavior: Behavior normal.    ED Results / Procedures / Treatments   Labs (all labs ordered are listed, but only abnormal results are displayed) Labs Reviewed  PREGNANCY, URINE    EKG None  Radiology No results found.  Procedures Procedures   Medications Ordered in ED Medications  diphenhydrAMINE (BENADRYL) capsule 50 mg (50 mg Oral Given 08/15/21 0926)  predniSONE (DELTASONE) tablet 60 mg (60 mg Oral Given 08/15/21 0925)  famotidine (PEPCID) tablet 20 mg (20 mg Oral Given 08/15/21 5852)    ED Course  I have reviewed the triage vital signs and the nursing notes.  Pertinent labs & imaging results that were available during my care of the patient were reviewed by me and considered in my medical decision making (see chart for details).    MDM Rules/Calculators/A&P                         Patient presenting with urticaria that started yesterday spontaneously without known new exposures or medications.  She did take Benadryl yesterday with some improvement but woke up this morning and had some itching in her throat.  She is well-appearing on exam without wheezing or stridor.  She is in no acute distress at this time.  No evidence of oral edema.  Patient given a  dose of Benadryl, Pepcid and prednisone.  Will reevaluate.  No prior history of similar symptoms.  10:29 AM Patient's urticaria is not any worse but  has not resolved.  No further airway issues.  She was given prednisone and Zyrtec.  Given allergy immunology information.     Final Clinical Impression(s) / ED Diagnoses Final diagnoses:  Urticaria    Rx / DC Orders ED Discharge Orders          Ordered    predniSONE (DELTASONE) 20 MG tablet  Daily        08/15/21 1028    cetirizine (ZYRTEC) 10 MG tablet  Daily        08/15/21 1028             Gwyneth Sprout, MD 08/15/21 1030

## 2021-08-15 NOTE — ED Triage Notes (Signed)
Per pt, states she started breaking out in hives on arms and legs yesterday-states she took some benadryl last night-states she woke up this am with an itchy throat-maintaining airway-able to swallow secretions-no s/s's of respiratory distress

## 2021-08-15 NOTE — Discharge Instructions (Signed)
You can also take Benadryl every 6 hours as needed.  If you start having any trouble breathing return to the emergency room immediately.

## 2021-08-15 NOTE — ED Notes (Signed)
Pt expressed little to no change in her rash/hives following medication.

## 2021-08-19 NOTE — L&D Delivery Note (Signed)
Operative Delivery Note At 1:48 PM a viable female was delivered via Vaginal, Spontaneous.  Presentation: vertex; Position: Right,, Occiput,, Anterior; Station: +3.  Verbal consent: obtained from patient.  Fetal heart rate in the 100s and patient has no urge to push and is not pushing at all with her contractions.  Risks and benefits of vacuum discussed in detail.  Risks include, but are not limited to the risks of anesthesia, bleeding, infection, damage to maternal tissues, fetal cephalhematoma.  There is also the risk of inability to effect vaginal delivery of the head, or shoulder dystocia that cannot be resolved by established maneuvers, leading to the need for emergency cesarean section.  APGAR: 9, 9; weight  pending.   Placenta status: spont, intact.   Cord:  3V with the following complications: None.  Cord pH: n/a  Anesthesia:  Epidural Instruments: Vacuum Episiotomy: None Lacerations: None Suture Repair:  n/a Est. Blood Loss (mL): 100  Mom to postpartum.  Baby to Couplet care / Skin to Skin.  Purcell Nails 05/02/2022, 2:16 PM

## 2021-08-25 ENCOUNTER — Inpatient Hospital Stay (HOSPITAL_COMMUNITY): Payer: Medicaid Other

## 2021-08-25 ENCOUNTER — Encounter (HOSPITAL_COMMUNITY): Payer: Self-pay | Admitting: Family Medicine

## 2021-08-25 ENCOUNTER — Other Ambulatory Visit: Payer: Self-pay

## 2021-08-25 ENCOUNTER — Inpatient Hospital Stay (HOSPITAL_COMMUNITY)
Admission: AD | Admit: 2021-08-25 | Discharge: 2021-08-25 | Disposition: A | Discharge status: Home / Self Care | Payer: Medicaid Other | Attending: Family Medicine | Admitting: Family Medicine

## 2021-08-25 DIAGNOSIS — Z3A01 Less than 8 weeks gestation of pregnancy: Secondary | ICD-10-CM

## 2021-08-25 DIAGNOSIS — A599 Trichomoniasis, unspecified: Secondary | ICD-10-CM | POA: Diagnosis present

## 2021-08-25 DIAGNOSIS — O4691 Antepartum hemorrhage, unspecified, first trimester: Secondary | ICD-10-CM

## 2021-08-25 DIAGNOSIS — R109 Unspecified abdominal pain: Secondary | ICD-10-CM | POA: Diagnosis not present

## 2021-08-25 DIAGNOSIS — R1030 Lower abdominal pain, unspecified: Secondary | ICD-10-CM

## 2021-08-25 DIAGNOSIS — O209 Hemorrhage in early pregnancy, unspecified: Secondary | ICD-10-CM | POA: Diagnosis not present

## 2021-08-25 DIAGNOSIS — O26891 Other specified pregnancy related conditions, first trimester: Secondary | ICD-10-CM | POA: Diagnosis not present

## 2021-08-25 DIAGNOSIS — O4690 Antepartum hemorrhage, unspecified, unspecified trimester: Secondary | ICD-10-CM

## 2021-08-25 DIAGNOSIS — O99891 Other specified diseases and conditions complicating pregnancy: Secondary | ICD-10-CM

## 2021-08-25 LAB — URINALYSIS, MICROSCOPIC (REFLEX): Bacteria, UA: NONE SEEN

## 2021-08-25 LAB — WET PREP, GENITAL
Sperm: NONE SEEN
WBC, Wet Prep HPF POC: >=10 — AB (ref <10)
Yeast Wet Prep HPF POC: NONE SEEN

## 2021-08-25 LAB — URINALYSIS, ROUTINE W REFLEX MICROSCOPIC
Bilirubin Urine: NEGATIVE
Glucose, UA: NEGATIVE mg/dL
Ketones, ur: NEGATIVE mg/dL
Nitrite: NEGATIVE
Protein, ur: NEGATIVE mg/dL
Specific Gravity, Urine: 1.01 (ref 1.005–1.030)
pH: 6 (ref 5.0–8.0)

## 2021-08-25 LAB — POCT PREGNANCY, URINE: Preg Test, Ur: POSITIVE — AB

## 2021-08-25 LAB — CBC
HCT: 37.5 % (ref 36.0–46.0)
Hemoglobin: 12.4 g/dL (ref 12.0–15.0)
MCH: 29.3 pg (ref 26.0–34.0)
MCHC: 33.1 g/dL (ref 30.0–36.0)
MCV: 88.7 fL (ref 80.0–100.0)
Platelets: 384 10*3/uL (ref 150–400)
RBC: 4.23 MIL/uL (ref 3.87–5.11)
RDW: 13.7 % (ref 11.5–15.5)
WBC: 12.9 10*3/uL — ABNORMAL HIGH (ref 4.0–10.5)
nRBC: 0 % (ref 0.0–0.2)

## 2021-08-25 LAB — HCG, QUANTITATIVE, PREGNANCY: hCG, Beta Chain, Quant, S: 6195 m[IU]/mL — ABNORMAL HIGH (ref <5)

## 2021-08-25 MED ORDER — PREPLUS 27-1 MG PO TABS: 1.0000 | ORAL_TABLET | Freq: Every day | ORAL | 13 refills | Status: DC

## 2021-08-25 MED ORDER — ACETAMINOPHEN 500 MG PO TABS: 1000.0000 mg | ORAL_TABLET | Freq: Once | ORAL | Status: DC

## 2021-08-25 MED ORDER — METRONIDAZOLE 500 MG PO TABS
500.0000 mg | ORAL_TABLET | Freq: Two times a day (BID) | ORAL | 0 refills | Status: DC
Start: 2021-08-25 — End: 2021-11-23

## 2021-08-25 NOTE — MAU Provider Note (Signed)
History     CSN: [id number]  Arrival date and time: 08/25/21 0434   Event Date/Time   First Provider Initiated Contact with Patient 08/25/21 [phone]      Chief Complaint  Patient presents with   Abdominal Pain   Vaginal Bleeding   Nicole Howard is a 31 y.o. G4P2002 at 342w0d by Definite LMP of 07/21/2022. She presents  today for Abdominal Pain and Vaginal Bleeding.  She states she started having bleeding about 2 hours ago.  She states is not heavy and she is not passing clots.  She is not wearing a pad as the blood is only with wiping.  She reports it is red in color.  She states she started having cramping shortly after bleeding that she states is constant and rates a 5/10.  She reports sexual activity in the past 24 hours.    OB History     Gravida  5   Para  2   Term  2   Preterm      AB      Living  2      SAB      IAB      Ectopic      Multiple      Live Births  2           Past Medical History:  Diagnosis Date   Headache    Medical history non-contributory     Past Surgical History:  Procedure Laterality Date   DILATION AND CURETTAGE, DIAGNOSTIC / THERAPEUTIC  10/2020   FOREIGN BODY REMOVAL Left 09/18/2015   Procedure: FOREIGN BODY REMOVAL OF BIRTH CONTROL DEVICE IN LEFT ARM ADULT;  Surgeon: Luretha MurphyMatthew Martin, MD;  Location: [facility];  Service: General;  Laterality: Left;   NO PAST SURGERIES      Family History  Adopted: Yes  Problem Relation Age of Onset   Obesity Father    Other Neg Hx     Social History   Tobacco Use   Smoking status: Never   Smokeless tobacco: Never  Vaping Use   Vaping Use: Never used  Substance Use Topics   Alcohol use: No   Drug use: No    Allergies:  Allergies  Allergen Reactions   Tramadol Nausea And Vomiting    Medications Prior to Admission  Medication Sig Dispense Refill Last Dose   acetaminophen (TYLENOL) 325 MG tablet Take by mouth. (Patient not taking: Reported on 02/13/2021)       cetirizine (ZYRTEC) 10 MG tablet Take 1 tablet (10 mg total) by mouth daily. 10 tablet 1    cyclobenzaprine (FLEXERIL) 5 MG tablet Take 1 tablet (5 mg total) by mouth 2 (two) times daily as needed for muscle spasms. May take up to 2 every 12 hours (Patient not taking: Reported on 02/13/2021) 60 tablet 0    doxycycline (VIBRAMYCIN) 100 MG capsule Take 1 capsule (100 mg total) by mouth 2 (two) times daily. 28 capsule 0    ondansetron (ZOFRAN) 4 MG tablet Take 1 tablet (4 mg total) by mouth every 8 (eight) hours as needed for nausea or vomiting. 30 tablet 0    polyethylene glycol (MIRALAX / GLYCOLAX) 17 g packet Take 17 g by mouth daily. (Patient not taking: Reported on 02/13/2021) 30 each 0    predniSONE (DELTASONE) 20 MG tablet Take 2 tablets (40 mg total) by mouth daily. 10 tablet 0    promethazine (PHENERGAN) 12.5 MG tablet Take 1 tablet (12.5 mg total) by  mouth every 6 (six) hours as needed for nausea or vomiting. May insert vaginally, if unable to keep it down (Patient not taking: Reported on 02/13/2021) 30 tablet 0     Review of Systems  Gastrointestinal:  Positive for abdominal pain. Negative for constipation, diarrhea, nausea and vomiting.  Genitourinary:  Negative for difficulty urinating and dysuria.  Physical Exam   Blood pressure 125/72, pulse (!) 111, temperature 98 F (36.7 C), temperature source Oral, resp. rate 17, last menstrual period 07/21/2021, SpO2 98 %, unknown if currently breastfeeding.  Physical Exam Vitals reviewed. Exam conducted with a chaperone present.  Constitutional:      Appearance: She is well-developed.  HENT:     Head: Normocephalic and atraumatic.  Cardiovascular:     Rate and Rhythm: Normal rate and regular rhythm.  Pulmonary:     Effort: Pulmonary effort is normal. No respiratory distress.  Abdominal:     General: Abdomen is flat.     Palpations: Abdomen is soft.     Tenderness: There is abdominal tenderness in the right lower quadrant, suprapubic area,  left upper quadrant and left lower quadrant.  Genitourinary:    Vagina: Vaginal discharge present.     Cervix: No cervical motion tenderness, discharge or friability.     Uterus: Tender.      Comments: Speculum Exam: -Normal External Genitalia: Non tender, no apparent discharge at introitus.  -Vaginal Vault: Pink mucosa with good rugae. Moderate amt thin white discharge. No odor apparent -wet prep collected -Cervix:Pink, no lesions, cysts, or polyps.  Appears closed. No active bleeding from os-GC/CT collected -Bimanual Exam:  No uterine enlargement. Tenderness noted. No CMT.   Skin:    General: Skin is warm and dry.  Neurological:     Mental Status: She is alert.  Psychiatric:        Mood and Affect: Mood normal.        Behavior: Behavior normal.    MAU Course  Procedures Results for orders placed or performed during the hospital encounter of 08/25/21 (from the past 24 hour(s))  Pregnancy, urine POC     Status: Abnormal   Collection Time: 08/25/21  4:56 AM  Result Value Ref Range   Preg Test, Ur POSITIVE (A) NEGATIVE  Urinalysis, Routine w reflex microscopic Urine, Clean Catch     Status: Abnormal   Collection Time: 08/25/21  5:07 AM  Result Value Ref Range   Color, Urine YELLOW YELLOW   APPearance CLEAR CLEAR   Specific Gravity, Urine 1.010 1.005 - 1.030   pH 6.0 5.0 - 8.0   Glucose, UA NEGATIVE NEGATIVE mg/dL   Hgb urine dipstick TRACE (A) NEGATIVE   Bilirubin Urine NEGATIVE NEGATIVE   Ketones, ur NEGATIVE NEGATIVE mg/dL   Protein, ur NEGATIVE NEGATIVE mg/dL   Nitrite NEGATIVE NEGATIVE   Leukocytes,Ua SMALL (A) NEGATIVE  Urinalysis, Microscopic (reflex)     Status: None   Collection Time: 08/25/21  5:07 AM  Result Value Ref Range   RBC / HPF 0-5 0 - 5 RBC/hpf   WBC, UA 0-5 0 - 5 WBC/hpf   Bacteria, UA NONE SEEN NONE SEEN   Squamous Epithelial / LPF 0-5 0 - 5  CBC     Status: Abnormal   Collection Time: 08/25/21  5:25 AM  Result Value Ref Range   WBC 12.9 (H) 4.0  - 10.5 K/uL   RBC 4.23 3.87 - 5.11 MIL/uL   Hemoglobin 12.4 12.0 - 15.0 g/dL   HCT 67.6 72.0 - 94.7 %  MCV 88.7 80.0 - 100.0 fL   MCH 29.3 26.0 - 34.0 pg   MCHC 33.1 30.0 - 36.0 g/dL   RDW 16.9 45.0 - 38.8 %   Platelets 384 150 - 400 K/uL   nRBC 0.0 0.0 - 0.2 %  hCG, quantitative, pregnancy     Status: Abnormal   Collection Time: 08/25/21  5:25 AM  Result Value Ref Range   hCG, Beta Chain, Quant, S 6,195 (H) <5 mIU/mL  Wet prep, genital     Status: Abnormal   Collection Time: 08/25/21  5:30 AM  Result Value Ref Range   Yeast Wet Prep HPF POC NONE SEEN NONE SEEN   Trich, Wet Prep PRESENT (A) NONE SEEN   Clue Cells Wet Prep HPF POC PRESENT (A) NONE SEEN   WBC, Wet Prep HPF POC >=10 (A) <10   Sperm NONE SEEN    US OB LESS THAN 14 WEEKS WITH OB TRANSVAGINAL  Result Date: 08/25/2021 CLINICAL DATA:  Vaginal bleeding and pelvic pain. EXAM: OBSTETRIC <14 WK [place] AND TRANSVAGINAL OB US TECHNIQUE: Both transabdominal and transvaginal ultrasound examinations were performed for complete evaluation of the gestation as well as the maternal uterus, adnexal regions, and pelvic cul-de-sac. Transvaginal technique was performed to assess early pregnancy. COMPARISON:  None. FINDINGS: Intrauterine gestational sac: Single. Yolk sac:  Not visualized Embryo:  Not visualized Cardiac Activity: Not visualized Heart Rate: N/A  bpm MSD: 5.8 mm   5 w   to d Subchorionic hemorrhage:  None visualized. Maternal uterus/adnexae: No adnexal mass. No evidence for intraperitoneal free fluid. IMPRESSION: Single intrauterine gestational sac identified without embryo or yolk sac evident. Mean sac diameter estimates 5 week 2 day gestational age. No adnexal mass. Electronically Signed   By: Kennith Center M.D.   On: 08/25/2021 06:09     MDM Pelvic Exam; Wet Prep and GC/CT Labs: UA, UPT, CBC, hCG, ABO Ultrasound Assessment and Plan  31 year old G4P2002 at 5 weeks Vaginal Bleeding Abdominal Pain  -POC Reviewed.  -Exam  performed and findings discussed. -Cultures collected and pending. -Labs ordered -Will give tylenol for pain. -Send for [place] and await results.   Cherre Robins 08/25/2021, 5:15 AM   Reassessment (6:41 AM) Trichomoniasis IUGS c/w Dates  -[place] and lab results as above. -[place] images reviewed by provider.  -Provider to bedside to discuss. -Reviewed treatment for trich and EPT Offered, but declined. -Rx for metronidazole sent to pharmacy on file.  -Discussed US findings and need for follow up. Order placed for repeat US in 1-2 weeks in outpatient setting. -Precautions Given. -Encouraged to call primary office or return to MAU if symptoms worsen or with the onset of new symptoms. -Discharged to home in stable condition.  Cherre Robins MSN, CNM Advanced Practice Provider, [facility]

## 2021-08-25 NOTE — MAU Note (Signed)
Pt c/o of abd pain and vag bleeding X 2hours.

## 2021-08-27 LAB — GC/CHLAMYDIA PROBE AMP (~~LOC~~) NOT AT ARMC
Chlamydia: NEGATIVE
Comment: NEGATIVE
Comment: NORMAL
Neisseria Gonorrhea: NEGATIVE

## 2021-08-29 ENCOUNTER — Encounter (HOSPITAL_COMMUNITY): Payer: Self-pay | Admitting: Obstetrics & Gynecology

## 2021-08-29 ENCOUNTER — Inpatient Hospital Stay (HOSPITAL_COMMUNITY): Payer: Medicaid Other

## 2021-08-29 ENCOUNTER — Other Ambulatory Visit: Payer: Self-pay

## 2021-08-29 ENCOUNTER — Inpatient Hospital Stay (HOSPITAL_COMMUNITY)
Admission: AD | Admit: 2021-08-29 | Discharge: 2021-08-29 | Disposition: A | Discharge status: Home / Self Care | Payer: Medicaid Other | Attending: Obstetrics & Gynecology | Admitting: Obstetrics & Gynecology

## 2021-08-29 DIAGNOSIS — O98311 Other infections with a predominantly sexual mode of transmission complicating pregnancy, first trimester: Secondary | ICD-10-CM | POA: Diagnosis not present

## 2021-08-29 DIAGNOSIS — O26891 Other specified pregnancy related conditions, first trimester: Secondary | ICD-10-CM | POA: Diagnosis not present

## 2021-08-29 DIAGNOSIS — R109 Unspecified abdominal pain: Secondary | ICD-10-CM | POA: Diagnosis present

## 2021-08-29 DIAGNOSIS — A599 Trichomoniasis, unspecified: Secondary | ICD-10-CM | POA: Insufficient documentation

## 2021-08-29 DIAGNOSIS — Z3491 Encounter for supervision of normal pregnancy, unspecified, first trimester: Secondary | ICD-10-CM

## 2021-08-29 DIAGNOSIS — Z3A01 Less than 8 weeks gestation of pregnancy: Secondary | ICD-10-CM | POA: Insufficient documentation

## 2021-08-29 DIAGNOSIS — O26899 Other specified pregnancy related conditions, unspecified trimester: Secondary | ICD-10-CM

## 2021-08-29 LAB — URINALYSIS, ROUTINE W REFLEX MICROSCOPIC
Bilirubin Urine: NEGATIVE
Glucose, UA: 250 mg/dL — AB
Hgb urine dipstick: NEGATIVE
Ketones, ur: NEGATIVE mg/dL
Leukocytes,Ua: NEGATIVE
Nitrite: NEGATIVE
Protein, ur: NEGATIVE mg/dL
Specific Gravity, Urine: 1.02 (ref 1.005–1.030)
pH: 6.5 (ref 5.0–8.0)

## 2021-08-29 LAB — CBC WITH DIFFERENTIAL/PLATELET
Abs Immature Granulocytes: 0.05 10*3/uL (ref 0.00–0.07)
Basophils Absolute: 0.1 10*3/uL (ref 0.0–0.1)
Basophils Relative: 1 %
Eosinophils Absolute: 0.1 10*3/uL (ref 0.0–0.5)
Eosinophils Relative: 1 %
HCT: 36.3 % (ref 36.0–46.0)
Hemoglobin: 12.1 g/dL (ref 12.0–15.0)
Immature Granulocytes: 0 %
Lymphocytes Relative: 13 %
Lymphs Abs: 1.6 10*3/uL (ref 0.7–4.0)
MCH: 29.6 pg (ref 26.0–34.0)
MCHC: 33.3 g/dL (ref 30.0–36.0)
MCV: 88.8 fL (ref 80.0–100.0)
Monocytes Absolute: 1 10*3/uL (ref 0.1–1.0)
Monocytes Relative: 8 %
Neutro Abs: 9.9 10*3/uL — ABNORMAL HIGH (ref 1.7–7.7)
Neutrophils Relative %: 77 %
Platelets: 380 10*3/uL (ref 150–400)
RBC: 4.09 MIL/uL (ref 3.87–5.11)
RDW: 13.9 % (ref 11.5–15.5)
WBC: 12.7 10*3/uL — ABNORMAL HIGH (ref 4.0–10.5)
nRBC: 0 % (ref 0.0–0.2)

## 2021-08-29 LAB — TYPE AND SCREEN
ABO/RH(D): O POS
Antibody Screen: NEGATIVE

## 2021-08-29 LAB — HCG, QUANTITATIVE, PREGNANCY: hCG, Beta Chain, Quant, S: 29457 m[IU]/mL — ABNORMAL HIGH (ref <5)

## 2021-08-29 MED ORDER — FENTANYL CITRATE (PF) 100 MCG/2ML IJ SOLN
50.0000 ug | Freq: Once | INTRAMUSCULAR | Status: AC
  Administered 2021-08-29: 50 ug via INTRAMUSCULAR
  Filled 2021-08-29: qty 2

## 2021-08-29 MED ORDER — KETOROLAC TROMETHAMINE 60 MG/2ML IM SOLN
60.0000 mg | Freq: Once | INTRAMUSCULAR | Status: AC
  Administered 2021-08-29: 60 mg via INTRAMUSCULAR
  Filled 2021-08-29: qty 2

## 2021-08-29 NOTE — MAU Note (Signed)
Pt returned to MAU from radiology.

## 2021-08-29 NOTE — MAU Provider Note (Addendum)
History     CSN: [id number]  Arrival date and time: 08/29/21 1045   Event Date/Time   First Provider Initiated Contact with Patient 08/29/21 1130      Chief Complaint  Patient presents with   Abdominal Pain   HPI  [name] is a 31 y.o. female [phone] @ 8325w4d here in MAU with worsening abdominal pain. She was seen on 1/7 for pain and bleeding. She was scheduled for a repeat US for viability in 1-2 weeks.  No bleeding. The pain became sharp upon waking up this morning. She currently rates her pain 7/10. She took tylenol which did not help.  She is currently taking treatment for trichomonas.   OB History     Gravida  4   Para  2   Term  2   Preterm      AB      Living  2      SAB      IAB      Ectopic      Multiple      Live Births  2           Past Medical History:  Diagnosis Date   Headache    Medical history non-contributory     Past Surgical History:  Procedure Laterality Date   DILATION AND CURETTAGE, DIAGNOSTIC / THERAPEUTIC  10/2020   FOREIGN BODY REMOVAL Left 09/18/2015   Procedure: FOREIGN BODY REMOVAL OF BIRTH CONTROL DEVICE IN LEFT ARM ADULT;  Surgeon: Luretha MurphyMatthew Martin, MD;  Location: [facility];  Service: General;  Laterality: Left;   NO PAST SURGERIES      Family History  Adopted: Yes  Problem Relation Age of Onset   Obesity Father    Other Neg Hx     Social History   Tobacco Use   Smoking status: Never   Smokeless tobacco: Never  Vaping Use   Vaping Use: Never used  Substance Use Topics   Alcohol use: No   Drug use: No    Allergies:  Allergies  Allergen Reactions   Tramadol Nausea And Vomiting    Medications Prior to Admission  Medication Sig Dispense Refill Last Dose   metroNIDAZOLE (FLAGYL) 500 MG tablet Take 1 tablet (500 mg total) by mouth 2 (two) times daily. 14 tablet 0 08/29/2021   Prenatal Vit-Fe Fumarate-FA (PREPLUS) 27-1 MG TABS Take 1 tablet by mouth daily. 30 tablet 13 08/29/2021    Results for orders placed or performed during the hospital encounter of 08/29/21 (from the past 48 hour(s))  Urinalysis, Routine w reflex microscopic Urine, Clean Catch     Status: Abnormal   Collection Time: 08/29/21 11:02 AM  Result Value Ref Range   Color, Urine YELLOW YELLOW   APPearance CLEAR CLEAR   Specific Gravity, Urine 1.020 1.005 - 1.030   pH 6.5 5.0 - 8.0   Glucose, UA 250 (A) NEGATIVE mg/dL   Hgb urine dipstick NEGATIVE NEGATIVE   Bilirubin Urine NEGATIVE NEGATIVE   Ketones, ur NEGATIVE NEGATIVE mg/dL   Protein, ur NEGATIVE NEGATIVE mg/dL   Nitrite NEGATIVE NEGATIVE   Leukocytes,Ua NEGATIVE NEGATIVE    Comment: Microscopic not done on urines with negative protein, blood, leukocytes, nitrite, or glucose < 500 mg/dL. Performed at [facility], [address] [place], [place] [address]   hCG, quantitative, pregnancy     Status: Abnormal   Collection Time: 08/29/21 11:43 AM  Result Value Ref Range   hCG, Beta Chain, Quant, S  29,457 (H) <5 mIU/mL    Comment:          GEST. AGE      CONC.  (mIU/mL)   <=1 WEEK        5 - 50     2 WEEKS       50 - 500     3 WEEKS       100 - 10,000     4 WEEKS     1,000 - 30,000     5 WEEKS     3,500 - 115,000   6-8 WEEKS     12,000 - 270,000    12 WEEKS     15,000 - 220,000        FEMALE AND NON-PREGNANT FEMALE:     LESS THAN 5 mIU/mL Performed at [facility], [address] [place], [place] [address]   Type and screen [facility]     Status: None   Collection Time: 08/29/21 11:43 AM  Result Value Ref Range   ABO/RH(D) O POS    Antibody Screen NEG    Sample Expiration      09/01/2021,2359 Performed at [facility], [address] [place], [place] [address]   CBC with Differential/Platelet     Status: Abnormal   Collection Time: 08/29/21 11:45 AM  Result Value Ref Range   WBC 12.7 (H) 4.0 - 10.5 K/uL   RBC 4.09 3.87 - 5.11 MIL/uL   Hemoglobin 12.1 12.0 - 15.0 g/dL   HCT 41.3 24.4 - 01.0  %   MCV 88.8 80.0 - 100.0 fL   MCH 29.6 26.0 - 34.0 pg   MCHC 33.3 30.0 - 36.0 g/dL   RDW 27.2 53.6 - 64.4 %   Platelets 380 150 - 400 K/uL   nRBC 0.0 0.0 - 0.2 %   Neutrophils Relative % 77 %   Neutro Abs 9.9 (H) 1.7 - 7.7 K/uL   Lymphocytes Relative 13 %   Lymphs Abs 1.6 0.7 - 4.0 K/uL   Monocytes Relative 8 %   Monocytes Absolute 1.0 0.1 - 1.0 K/uL   Eosinophils Relative 1 %   Eosinophils Absolute 0.1 0.0 - 0.5 K/uL   Basophils Relative 1 %   Basophils Absolute 0.1 0.0 - 0.1 K/uL   Immature Granulocytes 0 %   Abs Immature Granulocytes 0.05 0.00 - 0.07 K/uL    Comment: Performed at [facility], [address], [place], [place] [address]    MR PELVIS WO CONTRAST  Result Date: 08/29/2021 CLINICAL DATA:  Five weeks pregnant with lower abdominal pain. EXAM: MRI ABDOMEN AND PELVIS WITHOUT CONTRAST TECHNIQUE: Multiplanar multisequence MR imaging of the abdomen and pelvis was performed. No intravenous contrast was administered. COMPARISON:  None. Pelvic ultrasound, same date. FINDINGS: COMBINED FINDINGS FOR BOTH MR ABDOMEN AND PELVIS Lower chest: The lung bases are grossly clear. No pulmonary lesions or pleural effusion. No pericardial effusion. Hepatobiliary: No hepatic lesions or intrahepatic biliary dilatation. The gallbladder is unremarkable. No common bile duct dilatation. Pancreas:  No mass, inflammation or ductal dilatation. Spleen:  Normal size.  No focal lesions. Adrenals/Urinary Tract: Adrenal glands and kidneys are normal. No renal lesions or hydronephrosis. Stomach/Bowel: The stomach, duodenum, small bowel and colon are grossly normal. Low lying cecum deep in the pelvis. The appendix is not identified for certain but I do not see any findings suspicious for acute appendicitis. Vascular/Lymphatic: The major vascular structures appear normal. No abdominal or pelvic  lymphadenopathy. Reproductive: Gravid uterus. Intrauterine gestational sac noted at the fundal area. There is a corpus  luteum cyst noted on the left. The right ovary is unremarkable. Other: No ascites or abdominal wall hernia. Musculoskeletal: No significant findings. IMPRESSION: No MR findings to suggest acute appendicitis. Gravid uterus. Electronically Signed   By: Rudie Meyer M.D.   On: 08/29/2021 17:56   MR ABDOMEN WO CONTRAST  Result Date: 08/29/2021 CLINICAL DATA:  Five weeks pregnant with lower abdominal pain. EXAM: MRI ABDOMEN AND PELVIS WITHOUT CONTRAST TECHNIQUE: Multiplanar multisequence MR imaging of the abdomen and pelvis was performed. No intravenous contrast was administered. COMPARISON:  None. Pelvic ultrasound, same date. FINDINGS: COMBINED FINDINGS FOR BOTH MR ABDOMEN AND PELVIS Lower chest: The lung bases are grossly clear. No pulmonary lesions or pleural effusion. No pericardial effusion. Hepatobiliary: No hepatic lesions or intrahepatic biliary dilatation. The gallbladder is unremarkable. No common bile duct dilatation. Pancreas:  No mass, inflammation or ductal dilatation. Spleen:  Normal size.  No focal lesions. Adrenals/Urinary Tract: Adrenal glands and kidneys are normal. No renal lesions or hydronephrosis. Stomach/Bowel: The stomach, duodenum, small bowel and colon are grossly normal. Low lying cecum deep in the pelvis. The appendix is not identified for certain but I do not see any findings suspicious for acute appendicitis. Vascular/Lymphatic: The major vascular structures appear normal. No abdominal or pelvic lymphadenopathy. Reproductive: Gravid uterus. Intrauterine gestational sac noted at the fundal area. There is a corpus luteum cyst noted on the left. The right ovary is unremarkable. Other: No ascites or abdominal wall hernia. Musculoskeletal: No significant findings. IMPRESSION: No MR findings to suggest acute appendicitis. Gravid uterus. Electronically Signed   By: Rudie Meyer M.D.   On: 08/29/2021 17:56   US OB Transvaginal  Result Date: 08/29/2021 CLINICAL DATA:  Pain EXAM:  TRANSVAGINAL OB ULTRASOUND TECHNIQUE: Transvaginal ultrasound was performed for complete evaluation of the gestation as well as the maternal uterus, adnexal regions, and pelvic cul-de-sac. COMPARISON:  Obstetric ultrasound 08/25/2021 FINDINGS: Intrauterine gestational sac: Single Yolk sac:  Visualized. Embryo:  Not Visualized. Cardiac Activity: Not Visualized. Heart Rate: N/a bpm MSD: 11.1 mm   5 w   6 d Subchorionic hemorrhage:  None visualized. Maternal uterus/adnexae: The right ovary is normal measuring 2.6 cm x 2.9 cm x 1.5 cm. The left ovary measures 2.1 cm x 3.3 cm x 2.5 cm. A corpus luteum is noted on the left. IMPRESSION: Early intrauterine gestational sac with yolk sac with an estimated gestational age of [redacted] weeks 6 days by mean sac diameter, but no fetal pole or cardiac activity yet visualized. Recommend follow-up quantitative B-HCG levels and follow-up US in 14 days to assess viability. This recommendation follows SRU consensus guidelines: Diagnostic Criteria for Nonviable Pregnancy Early in the First Trimester. Malva Limes Med 2013; 546:2703-50. Electronically Signed   By: Lesia Hausen M.D.   On: 08/29/2021 12:07     Review of Systems  Constitutional:  Negative for fever.  Gastrointestinal:  Positive for abdominal pain. Negative for diarrhea, nausea and vomiting.  Genitourinary:  Negative for dysuria, vaginal discharge and vaginal pain.  Physical Exam   Blood pressure 136/79, pulse (!) 109, temperature 98.3 F (36.8 C), temperature source Oral, resp. rate 17, height 5\' 5"  (1.651 m), weight 52.8 kg, last menstrual period 07/21/2021, SpO2 100 %, unknown if currently breastfeeding.  Physical Exam Vitals and nursing note reviewed.  Constitutional:      General: She is not in acute distress.    Appearance: She is  well-developed. She is ill-appearing. She is not toxic-appearing.  HENT:     Head: Normocephalic.  Abdominal:     Tenderness: There is abdominal tenderness in the right lower quadrant,  suprapubic area and left lower quadrant. There is guarding. Negative signs include McBurney's sign.     Hernia: No hernia is present.  Genitourinary:    Cervix: Normal.     Adnexa: Right adnexa normal and left adnexa normal.     Comments: Cervix closed, thick, posterior. No CMT Exam by Venia CarbonJennifer Brandyn Lowrey, NP  Skin:    General: Skin is warm.     Capillary Refill: Capillary refill takes more than 3 seconds.  Neurological:     Mental Status: She is alert and oriented to person, place, and time.   MAU Course  Procedures None  MDM  2nd visit to MAU with abdominal pain with leukocytosis.  Currently taking treatment for trichomonas  US ordered with yolk sac visualized. Patient requesting pain medication. Toradol ordered prior to US, only minimal relief. Pain continues to be 7/10. Fentanyl ordered IM; patient with allergy to tramadol -MRI or abdomen/ pelvis.  Patient reports pain is still present however does feel well enough to go home. MRI normal Corpus luteal cyst found, unlikely pain source.   Assessment and Plan   A:  1. Trichomoniasis   2. Abdominal pain affecting pregnancy   3. Normal intrauterine pregnancy on prenatal ultrasound in first trimester   4. [redacted] weeks gestation of pregnancy      P:  Discharge home Strict return precautions Ok to use tylenol as directed on the bottle  Venia Carbonasch, Berklee Battey I, NP 08/29/2021 6:29 PM

## 2021-08-29 NOTE — MAU Note (Signed)
About 0800, her stomach started hurting real bad, took some Tylenol- did not help at all. No bleeding. Denies urinary problems, constipation or diarrhea.

## 2021-08-29 NOTE — MAU Note (Signed)
Radiology at bedside to transport pt to MRI.

## 2021-08-30 LAB — CULTURE, OB URINE: Culture: NO GROWTH

## 2021-09-06 ENCOUNTER — Ambulatory Visit
Admission: RE | Admit: 2021-09-06 | Discharge: 2021-09-06 | Disposition: A | Discharge status: Home / Self Care | Payer: Medicaid Other | Source: Ambulatory Visit

## 2021-09-06 ENCOUNTER — Telehealth: Payer: Self-pay | Admitting: Medical

## 2021-09-06 ENCOUNTER — Other Ambulatory Visit: Payer: Self-pay

## 2021-09-06 DIAGNOSIS — Z3A01 Less than 8 weeks gestation of pregnancy: Secondary | ICD-10-CM | POA: Diagnosis present

## 2021-09-06 DIAGNOSIS — O99891 Other specified diseases and conditions complicating pregnancy: Secondary | ICD-10-CM | POA: Diagnosis present

## 2021-09-06 DIAGNOSIS — R1030 Lower abdominal pain, unspecified: Secondary | ICD-10-CM | POA: Insufficient documentation

## 2021-09-06 NOTE — Telephone Encounter (Signed)
I called Ferne Coe today at 3:37 PM and confirmed patient's identity using two patient identifiers. [place] results from earlier today were reviewed. Patient is not yet scheduled for new OB visit at this time. An in-basket message has been sent to [facility] to schedule in ~4 weeks. First trimester warning signs reviewed. Patient encouraged to start prenatal vitamins. Patient voiced understanding and had no further questions.   US OB Transvaginal  Result Date: 09/06/2021 CLINICAL DATA:  Viability, abdominal cramping EXAM: OBSTETRIC <14 WK ULTRASOUND TECHNIQUE: Transabdominal ultrasound was performed for evaluation of the gestation as well as the maternal uterus and adnexal regions. COMPARISON:  08/29/2021 FINDINGS: Intrauterine gestational sac: Single Yolk sac:  Visualized. Embryo:  Visualized. Cardiac Activity: Visualized. Heart Rate: 121 bpm CRL:   8.5 mm   6 w 6 d                  [place] EDC: 04/26/2022 Subchorionic hemorrhage:  None visualized. Maternal uterus/adnexae: Left ovarian corpus luteum. IMPRESSION: Single intrauterine gestation at sonographic gestational age of [redacted] weeks, 6 days. Fetal heart rate 121 beats per minute. EDD 04/26/2022. Electronically Signed   By: Jearld Lesch M.D.   On: 09/06/2021 09:34    Marny Lowenstein, PA-C 09/06/2021 3:37 PM

## 2021-09-10 ENCOUNTER — Telehealth: Payer: Self-pay | Admitting: Obstetrics and Gynecology

## 2021-09-10 NOTE — Telephone Encounter (Signed)
Attempted to reach patient about scheduling an appointment to establish care. Left a voicemail message for her to call me back to get an appointment.

## 2021-11-23 ENCOUNTER — Inpatient Hospital Stay (HOSPITAL_COMMUNITY)
Admission: AD | Admit: 2021-11-23 | Discharge: 2021-11-23 | Disposition: A | Discharge status: Home / Self Care | Payer: Medicaid Other | Attending: Obstetrics and Gynecology | Admitting: Obstetrics and Gynecology

## 2021-11-23 ENCOUNTER — Other Ambulatory Visit: Payer: Self-pay

## 2021-11-23 ENCOUNTER — Encounter (HOSPITAL_COMMUNITY): Payer: Self-pay | Admitting: Obstetrics and Gynecology

## 2021-11-23 ENCOUNTER — Inpatient Hospital Stay (HOSPITAL_BASED_OUTPATIENT_CLINIC_OR_DEPARTMENT_OTHER): Payer: Medicaid Other

## 2021-11-23 DIAGNOSIS — Z3A17 17 weeks gestation of pregnancy: Secondary | ICD-10-CM

## 2021-11-23 DIAGNOSIS — M79662 Pain in left lower leg: Secondary | ICD-10-CM | POA: Diagnosis present

## 2021-11-23 DIAGNOSIS — O26892 Other specified pregnancy related conditions, second trimester: Secondary | ICD-10-CM | POA: Insufficient documentation

## 2021-11-23 DIAGNOSIS — M79605 Pain in left leg: Secondary | ICD-10-CM

## 2021-11-23 MED ORDER — CYCLOBENZAPRINE HCL 10 MG PO TABS
10.0000 mg | ORAL_TABLET | Freq: Two times a day (BID) | ORAL | 0 refills | Status: DC | PRN
Start: 2021-11-23 — End: 2021-12-30

## 2021-11-23 MED ORDER — CYCLOBENZAPRINE HCL 5 MG PO TABS
10.0000 mg | ORAL_TABLET | Freq: Once | ORAL | Status: AC
Start: 2021-11-23 — End: 2021-11-23
  Administered 2021-11-23: 10 mg via ORAL
  Filled 2021-11-23: qty 2

## 2021-11-23 NOTE — MAU Provider Note (Signed)
?History  ?  ? ?CSN: [id number] ? ?Arrival date and time: 11/23/21 1051 ? ? Event Date/Time  ? First Provider Initiated Contact with Patient 11/23/21 1139   ?  ? ?Chief Complaint  ?Patient presents with  ? Left Leg Pain  ? ?Nicole Howard is a 31 y.o. G4P2002 at [redacted]w[redacted]d who presents today with LLE pain x 2 days. She rates the pain 4/10 at this time. She has not taken anything for the pain at this time. She tried tylenol yesterday, but reports that did not help. She has not noticed any swelling or redness. She denies any chest pain or shortness of breath.  ? ? ?OB History   ? ? Gravida  ?4  ? Para  ?2  ? Term  ?2  ? Preterm  ?   ? AB  ?   ? Living  ?2  ?  ? ? SAB  ?   ? IAB  ?   ? Ectopic  ?   ? Multiple  ?   ? Live Births  ?2  ?   ?  ?  ? ? ?Past Medical History:  ?Diagnosis Date  ? Headache   ? Medical history non-contributory   ? ? ?Past Surgical History:  ?Procedure Laterality Date  ? DILATION AND CURETTAGE, DIAGNOSTIC / THERAPEUTIC  10/2020  ? FOREIGN BODY REMOVAL Left 09/18/2015  ? Procedure: FOREIGN BODY REMOVAL OF BIRTH CONTROL DEVICE IN LEFT ARM ADULT;  Surgeon: Luretha Murphy, MD;  Location: [facility];  Service: General;  Laterality: Left;  ? NO PAST SURGERIES    ? ? ?Family History  ?Adopted: Yes  ?Problem Relation Age of Onset  ? Other Neg Hx   ? ? ?Social History  ? ?Tobacco Use  ? Smoking status: Never  ? Smokeless tobacco: Never  ?Vaping Use  ? Vaping Use: Never used  ?Substance Use Topics  ? Alcohol use: No  ? Drug use: No  ? ? ?Allergies:  ?Allergies  ?Allergen Reactions  ? Tramadol Nausea And Vomiting  ? ? ?Medications Prior to Admission  ?Medication Sig Dispense Refill Last Dose  ? Prenatal Vit-Fe Fumarate-FA (PREPLUS) 27-1 MG TABS Take 1 tablet by mouth daily. 30 tablet 13 11/22/2021  ? metroNIDAZOLE (FLAGYL) 500 MG tablet Take 1 tablet (500 mg total) by mouth 2 (two) times daily. 14 tablet 0   ? ? ?Review of Systems  ?All other systems reviewed and are negative. ?Physical Exam  ? ?Blood  pressure 109/69, pulse 87, temperature 99.3 ?F (37.4 ?C), temperature source Oral, resp. rate 19, height 5\' 5"  (1.651 m), weight 57.2 kg, last menstrual period 07/21/2021, SpO2 100 %, unknown if currently breastfeeding. ? ?Physical Exam ?Constitutional:   ?   Appearance: She is well-developed.  ?HENT:  ?   Head: Normocephalic.  ?Eyes:  ?   Pupils: Pupils are equal, round, and reactive to light.  ?Cardiovascular:  ?   Rate and Rhythm: Normal rate and regular rhythm.  ?   Heart sounds: Normal heart sounds.  ?Pulmonary:  ?   Effort: Pulmonary effort is normal. No respiratory distress.  ?   Breath sounds: Normal breath sounds.  ?Abdominal:  ?   Palpations: Abdomen is soft.  ?   Tenderness: There is no abdominal tenderness.  ?Genitourinary: ?   Vagina: No bleeding. Vaginal discharge: mucusy. ?   Comments:  ?  ? ? ?Musculoskeletal:     ?   General: Tenderness (LLE) present. Swelling: RLE=29.5cm, LLE=31cm.Normal range of motion.  ?  Cervical back: Normal range of motion and neck supple.  ?Skin: ?   General: Skin is warm and dry.  ?Neurological:  ?   Mental Status: She is alert and oriented to person, place, and time.  ?Psychiatric:     ?   Mood and Affect: Mood normal.     ?   Behavior: Behavior normal.  ? ?FHT 160 with doppler  ? ?VAS US LOWER EXTREMITY VENOUS (DVT) (ONLY [facility] & [facility]) ? ?Result Date: 11/23/2021 ? Lower Venous DVT Study Patient Name:  Nicole Howard  Date of Exam:   11/23/2021 Medical Rec #: [id number]        Accession #:    [id number] Date of Birth: Jan 11, 1991        Patient Gender: F Patient Age:   [age] years [facility]am [facility] Procedure:      VAS US LOWER EXTREMITY VENOUS (DVT) Referring Phys: Efraim Vanallen --------------------------------------------------------------------------------  Indications: Left calf pain.  Risk Factors: Pregnancy. Performing Technologist: Jean Rosenthalachel Hodge RDMS, RVT  Examination Guidelines: A complete evaluation includes B-mode imaging, spectral Doppler, color Doppler, and  power Doppler as needed of all accessible portions of each vessel. Bilateral testing is considered an integral part of a complete examination. Limited examinations for reoccurring indications may be performed as noted. The reflux portion of the exam is performed with the patient in reverse Trendelenburg.  +-----+---------------+---------+-----------+----------+--------------+ RIGHTCompressibilityPhasicitySpontaneityPropertiesThrombus Aging +-----+---------------+---------+-----------+----------+--------------+ CFV  Full           Yes      Yes                                 +-----+---------------+---------+-----------+----------+--------------+   +---------+---------------+---------+-----------+----------+--------------+ LEFT     CompressibilityPhasicitySpontaneityPropertiesThrombus Aging +---------+---------------+---------+-----------+----------+--------------+ CFV      Full           Yes      Yes                                 +---------+---------------+---------+-----------+----------+--------------+ SFJ      Full                                                        +---------+---------------+---------+-----------+----------+--------------+ FV Prox  Full                                                        +---------+---------------+---------+-----------+----------+--------------+ FV Mid   Full                                                        +---------+---------------+---------+-----------+----------+--------------+ FV DistalFull                                                        +---------+---------------+---------+-----------+----------+--------------+  PFV      Full                                                        +---------+---------------+---------+-----------+----------+--------------+ POP      Full           Yes      Yes                                  +---------+---------------+---------+-----------+----------+--------------+ PTV      Full                                                        +---------+---------------+---------+-----------+----------+--------------+ PERO     Full                                                        +---------+---------------+---------+-----------+----------+--------------+ Gastroc  Full                                                        +---------+---------------+---------+-----------+----------+--------------+     Summary: RIGHT: - No evidence of common femoral vein obstruction.  LEFT: - There is no evidence of deep vein thrombosis in the lower extremity.  - No cystic structure found in the popliteal fossa.  *See table(s) above for measurements and observations.    Preliminary    ? ? ?MAU Course  ?Procedures ? ?MDM ?Patient has had flexeril and reports that her pain has improved somewhat  ?Venous duplex with no evidence of DVT in LLE ? ?Assessment and Plan  ? ?1. Left leg pain   ?2. [redacted] weeks gestation of pregnancy   ? ?DC home ?Comfort measures reviewed  ?2nd/3rd Trimester precautions  ?PTL precautions  ?Fetal kick counts ?RX: flexeril 10mg  PRN #20  ?Return to MAU as needed ?FU with OB as planned ? ? Follow-up Information   ? ? [facility] Follow up.   ?Specialty: Obstetrics and Gynecology ?Why: As scheduled ?Contact information: ?[address] ?[address] ?[place] ?[phone] ? ?  ?  ? ?  ?  ? ?  ? ?[phone] DNP, CNM  ?11/23/21  2:36 PM  ? ? ? ?

## 2021-11-23 NOTE — MAU Note (Signed)
Nicole Howard is a 31 y.o. at [redacted]w[redacted]d here in MAU reporting: pain in left calf that began 2 days ago.  Reports pain was intermittent, now it's constant.  Reports area is warm to touch, no redness noted ? ?Onset of complaint: 2 days ago ?Pain score: 3/10 ?Vitals:  ? 11/23/21 1102  ?BP: 121/73  ?Pulse: 95  ?Resp: 19  ?Temp: 99.3 ?F (37.4 ?C)  ?SpO2: 100%  ?   ?FHT:160 bpm ?Lab orders placed from triage:    ?

## 2021-12-30 ENCOUNTER — Other Ambulatory Visit: Payer: Self-pay

## 2021-12-30 ENCOUNTER — Inpatient Hospital Stay (HOSPITAL_COMMUNITY)
Admission: AD | Admit: 2021-12-30 | Discharge: 2021-12-30 | Disposition: A | Discharge status: Home / Self Care | Payer: Medicaid Other | Attending: Obstetrics and Gynecology | Admitting: Obstetrics and Gynecology

## 2021-12-30 DIAGNOSIS — O26899 Other specified pregnancy related conditions, unspecified trimester: Secondary | ICD-10-CM | POA: Diagnosis not present

## 2021-12-30 DIAGNOSIS — Z3A23 23 weeks gestation of pregnancy: Secondary | ICD-10-CM | POA: Diagnosis not present

## 2021-12-30 DIAGNOSIS — R102 Pelvic and perineal pain: Secondary | ICD-10-CM | POA: Insufficient documentation

## 2021-12-30 DIAGNOSIS — O26892 Other specified pregnancy related conditions, second trimester: Secondary | ICD-10-CM | POA: Diagnosis present

## 2021-12-30 LAB — URINALYSIS, ROUTINE W REFLEX MICROSCOPIC
Bilirubin Urine: NEGATIVE
Glucose, UA: 50 mg/dL — AB
Hgb urine dipstick: NEGATIVE
Ketones, ur: NEGATIVE mg/dL
Leukocytes,Ua: NEGATIVE
Nitrite: NEGATIVE
Protein, ur: NEGATIVE mg/dL
Specific Gravity, Urine: 1.021 (ref 1.005–1.030)
pH: 7 (ref 5.0–8.0)

## 2021-12-30 MED ORDER — MISC. DEVICES MISC: 0 refills | Status: DC

## 2021-12-30 MED ORDER — CYCLOBENZAPRINE HCL 10 MG PO TABS: 10.0000 mg | ORAL_TABLET | Freq: Two times a day (BID) | ORAL | 3 refills | Status: DC | PRN

## 2021-12-30 NOTE — MAU Provider Note (Signed)
?History  ?  ? ?CSN: [id number] ? ?Arrival date and time: 12/30/21 1005 ? ? Event Date/Time  ? First Provider Initiated Contact with Patient 12/30/21 1058   ?  ? ?Chief Complaint  ?Patient presents with  ? Pelvic Pain  ? ?HPI ?Jaylynn Mcaleer is a 31 y.o. G4P2002 at [redacted]w[redacted]d who presents to MAU with chief complaint of "pelvic pain". Locus of pain is her lower abdomen and sometimes her low back. These are recurrent problems, worsening over time.  She is not experiencing pain at her pubic symphysis, groin or at her hip joints. Current pain score is 1-2/10 at rest, 8-9/10 with walking and standing. She attempted management with 1,000 mg Tylenol around 0700 and experienced improvement but not relief.  She has not tried other treatments for this complaint. She denies contractions, vaginal bleeding, DFM, dysuria, fever or recent illness. ? ?OB History   ? ? Gravida  ?4  ? Para  ?2  ? Term  ?2  ? Preterm  ?   ? AB  ?   ? Living  ?2  ?  ? ? SAB  ?   ? IAB  ?   ? Ectopic  ?   ? Multiple  ?   ? Live Births  ?2  ?   ?  ?  ? ? ?Past Medical History:  ?Diagnosis Date  ? Headache   ? Medical history non-contributory   ? ? ?Past Surgical History:  ?Procedure Laterality Date  ? DILATION AND CURETTAGE, DIAGNOSTIC / THERAPEUTIC  10/2020  ? FOREIGN BODY REMOVAL Left 09/18/2015  ? Procedure: FOREIGN BODY REMOVAL OF BIRTH CONTROL DEVICE IN LEFT ARM ADULT;  Surgeon: Luretha Murphy, MD;  Location: [facility];  Service: General;  Laterality: Left;  ? NO PAST SURGERIES    ? ? ?Family History  ?Adopted: Yes  ?Problem Relation Age of Onset  ? Other Neg Hx   ? ? ?Social History  ? ?Tobacco Use  ? Smoking status: Never  ? Smokeless tobacco: Never  ?Vaping Use  ? Vaping Use: Never used  ?Substance Use Topics  ? Alcohol use: No  ? Drug use: No  ? ? ?Allergies:  ?Allergies  ?Allergen Reactions  ? Tramadol Nausea And Vomiting  ? ? ?Medications Prior to Admission  ?Medication Sig Dispense Refill Last Dose  ? Prenatal Vit-Fe Fumarate-FA  (PREPLUS) 27-1 MG TABS Take 1 tablet by mouth daily. 30 tablet 13   ? [DISCONTINUED] cyclobenzaprine (FLEXERIL) 10 MG tablet Take 1 tablet (10 mg total) by mouth 2 (two) times daily as needed for muscle spasms. 20 tablet 0   ? ? ?Review of Systems  ?Gastrointestinal:  Positive for abdominal pain.  ?Genitourinary:  Positive for pelvic pain.  ?All other systems reviewed and are negative. ?Physical Exam  ? ?Blood pressure 116/69, pulse 87, temperature 98.6 ?F (37 ?C), temperature source Oral, resp. rate 17, last menstrual period 07/21/2021, SpO2 98 %, unknown if currently breastfeeding. ? ?Physical Exam ?Vitals and nursing note reviewed. Exam conducted with a chaperone present.  ?Constitutional:   ?   Appearance: Normal appearance.  ?Cardiovascular:  ?   Rate and Rhythm: Normal rate and regular rhythm.  ?   Pulses: Normal pulses.  ?   Heart sounds: Normal heart sounds.  ?Pulmonary:  ?   Effort: Pulmonary effort is normal.  ?   Breath sounds: Normal breath sounds.  ?Abdominal:  ?   Comments: Gravid  ?Neurological:  ?   Mental Status: She is  alert.  ? ? ?MAU Course  ?Procedures ? ?MDM ?--Reactive tracing: baseline 150, mod var, + accels, occasional decels WNL for gestational age ?--Toco: quiet ?--Patient does not have ride home, unable to give Flexeril due to likely somnolence ?--Declines pelvic exam to assess for visible dilation, declines digital exam ? ?Patient Vitals for the past 24 hrs: ? BP Temp Temp src Pulse Resp SpO2  ?12/30/21 1108 (!) 101/56 98.3 ?F (36.8 ?C) Oral 84 18 97 %  ?12/30/21 1019 116/69 98.6 ?F (37 ?C) Oral 87 17 98 %  ? ?Results for orders placed or performed during the hospital encounter of 12/30/21 (from the past 24 hour(s))  ?Urinalysis, Routine w reflex microscopic Urine, Clean Catch     Status: Abnormal  ? Collection Time: 12/30/21 10:22 AM  ?Result Value Ref Range  ? Color, Urine YELLOW YELLOW  ? APPearance HAZY (A) CLEAR  ? Specific Gravity, Urine 1.021 1.005 - 1.030  ? pH 7.0 5.0 - 8.0  ?  Glucose, UA 50 (A) NEGATIVE mg/dL  ? Hgb urine dipstick NEGATIVE NEGATIVE  ? Bilirubin Urine NEGATIVE NEGATIVE  ? Ketones, ur NEGATIVE NEGATIVE mg/dL  ? Protein, ur NEGATIVE NEGATIVE mg/dL  ? Nitrite NEGATIVE NEGATIVE  ? Leukocytes,Ua NEGATIVE NEGATIVE  ? ?Meds ordered this encounter  ?Medications  ? cyclobenzaprine (FLEXERIL) 10 MG tablet  ?  Sig: Take 1 tablet (10 mg total) by mouth 2 (two) times daily as needed for muscle spasms.  ?  Dispense:  20 tablet  ?  Refill:  3  ?  Order Specific Question:   Supervising Provider  ?  AnswerMilas Hock [[id number]]  ? Misc. Devices MISC  ?  Sig: Dispense one maternity belt for patient  ?  Dispense:  1 each  ?  Refill:  0  ?  Order Specific Question:   Supervising Provider  ?  AnswerMilas Hock [[id number]]  ? ?Assessment and Plan  ?--31 y.o. [id number] at [redacted]w[redacted]d  ?--Reassuring fetal surveillance ?--Pelvic and round ligament pain in pregnancy ?--Cervical exam declined by patient ?--New rx maternity belt, Flexeril PRN ?--Discharge home in stable condition ? ?Calvert Cantor, CNM ?12/30/2021, 3:31 PM  ?

## 2021-12-30 NOTE — MAU Note (Signed)
Pt reports to mau with c/o ongoing pelvic pain that has worsened over the past few days.  Pt states pain is worse when she walks or changes positions.  Denies bleeding or LOF.  Pain at rest 2/10, 9/10 with activity  ?

## 2021-12-30 NOTE — Discharge Instructions (Signed)
Locations that carry Maternity Belts/Belly Bands: ? ?[facility] ? ?[address] [place], [place] [address] ? ?[phone] ? ?[facility] ? ?[address] [place], [place] [address] ? ?[phone] ? ?[facility] ? ?[address] [place], [place] [address] ? ?[phone] ? ?[facility] ? ?[address] [place], [place] [address] ? ?[phone] ?

## 2022-04-03 ENCOUNTER — Ambulatory Visit
Admission: EM | Admit: 2022-04-03 | Discharge: 2022-04-03 | Disposition: A | Discharge status: Home / Self Care | Payer: Medicaid Other | Attending: Emergency Medicine | Admitting: Emergency Medicine

## 2022-04-03 DIAGNOSIS — H00014 Hordeolum externum left upper eyelid: Secondary | ICD-10-CM | POA: Diagnosis not present

## 2022-04-03 MED ORDER — CEFDINIR 300 MG PO CAPS: 300.0000 mg | ORAL_CAPSULE | Freq: Two times a day (BID) | ORAL | 0 refills | Status: AC

## 2022-04-03 NOTE — Discharge Instructions (Signed)
Instead of amoxicillin, recommend that you begin cefdinir as it has better coverage of the bacteria that cause infections of the eyelids and is actually considered safer during pregnancy.  Please take 1 capsule twice daily for the next 5 days.  Please continue to apply warm compresses to your left eyelid every 3-4 hours for 20 minutes each time.  I also recommend that you use baby shampoo to wash your eyelids and eyelashes very well twice daily.  Please go to the emergency room if you see worsening redness or have worsening pain despite these recommendations.  Thank you for visiting urgent care today.

## 2022-04-03 NOTE — ED Provider Notes (Signed)
[facility]    CSN: [id number] Arrival date & time: 04/03/22  1901    HISTORY   Chief Complaint  Patient presents with   Stye   HPI Nicole Howard is a pleasant, 31 y.o. female who presents to urgent care today. The patient states she has had a stye to her left upper eyelid for 2 weeks.  Home interventions: tylenol, warm compresses.  Patient is currently pregnant, due date: 04/27/22.  The history is provided by the patient.   Past Medical History:  Diagnosis Date   Headache    Medical history non-contributory    Patient Active Problem List   Diagnosis Date Noted   Trichomoniasis 08/25/2021   Past Surgical History:  Procedure Laterality Date   DILATION AND CURETTAGE, DIAGNOSTIC / THERAPEUTIC  10/2020   FOREIGN BODY REMOVAL Left 09/18/2015   Procedure: FOREIGN BODY REMOVAL OF BIRTH CONTROL DEVICE IN LEFT ARM ADULT;  Surgeon: Luretha Murphy, MD;  Location: [facility];  Service: General;  Laterality: Left;   NO PAST SURGERIES     OB History     Gravida  4   Para  2   Term  2   Preterm      AB      Living  2      SAB      IAB      Ectopic      Multiple      Live Births  2          Home Medications    Prior to Admission medications   Medication Sig Start Date End Date Taking? Authorizing Provider  Misc. Devices MISC Dispense one maternity belt for patient 12/30/21   Calvert Cantor, CNM  Prenatal Vit-Fe Fumarate-FA (PREPLUS) 27-1 MG TABS Take 1 tablet by mouth daily. 08/25/21   Gerrit Heck, CNM    Family History Family History  Adopted: Yes  Problem Relation Age of Onset   Other Neg Hx    Social History Social History   Tobacco Use   Smoking status: Never   Smokeless tobacco: Never  Vaping Use   Vaping Use: Never used  Substance Use Topics   Alcohol use: No   Drug use: No   Allergies   Tramadol  Review of Systems Review of Systems Pertinent findings revealed after performing a 14 point review of  systems has been noted in the history of present illness.  Physical Exam Triage Vital Signs ED Triage Vitals  Enc Vitals Group     BP 06/15/21 0827 (!) 147/82     Pulse Rate 06/15/21 0827 72     Resp 06/15/21 0827 18     Temp 06/15/21 0827 98.3 F (36.8 C)     Temp Source 06/15/21 0827 Oral     SpO2 06/15/21 0827 98 %     Weight --      Height --      Head Circumference --      Peak Flow --      Pain Score 06/15/21 0826 5     Pain Loc --      Pain Edu? --      Excl. in GC? --   No data found.  Updated Vital Signs BP 110/69 (BP Location: Left Arm)   Pulse 81   Temp 98.3 F (36.8 C) (Oral)   Resp 16   LMP 07/21/2021   SpO2 99%   Physical Exam Vitals and nursing note reviewed.  Constitutional:  General: She is not in acute distress.    Appearance: Normal appearance.  HENT:     Head: Normocephalic and atraumatic.  Eyes:     General:        Left eye: Hordeolum (Upper lid) present.    Pupils: Pupils are equal, round, and reactive to light.  Cardiovascular:     Rate and Rhythm: Normal rate and regular rhythm.  Pulmonary:     Effort: Pulmonary effort is normal.     Breath sounds: Normal breath sounds.  Musculoskeletal:        General: Normal range of motion.     Cervical back: Normal range of motion and neck supple.  Skin:    General: Skin is warm and dry.  Neurological:     General: No focal deficit present.     Mental Status: She is alert and oriented to person, place, and time. Mental status is at baseline.  Psychiatric:        Mood and Affect: Mood normal.        Behavior: Behavior normal.        Thought Content: Thought content normal.        Judgment: Judgment normal.     Visual Acuity Right Eye Distance:   Left Eye Distance:   Bilateral Distance:    Right Eye Near:   Left Eye Near:    Bilateral Near:     UC Couse / Diagnostics / Procedures:     Radiology No results found.  Procedures Procedures (including critical care  time) EKG  Pending results:  Labs Reviewed - No data to display  Medications Ordered in UC: Medications - No data to display  UC Diagnoses / Final Clinical Impressions(s)   I have reviewed the triage vital signs and the nursing notes.  Pertinent labs & imaging results that were available during my care of the patient were reviewed by me and considered in my medical decision making (see chart for details).    Final diagnoses:  Hordeolum externum of left upper eyelid   Patient provided with a prescription for cefdinir for 5 days.  Patient also advised to use baby shampoo and warm compresses for continued conservative care.  Patient advised to the emergency room if despite these recommendations redness, swelling and pain continues to worsen or she begins to have vision changes.  ED Prescriptions     Medication Sig Dispense Auth. Provider   cefdinir (OMNICEF) 300 MG capsule Take 1 capsule (300 mg total) by mouth 2 (two) times daily for 5 days. 10 capsule Theadora Rama Scales, PA-C      PDMP not reviewed this encounter.  Pending results:  Labs Reviewed - No data to display  Discharge Instructions:   Discharge Instructions      Instead of amoxicillin, recommend that you begin cefdinir as it has better coverage of the bacteria that cause infections of the eyelids and is actually considered safer during pregnancy.  Please take 1 capsule twice daily for the next 5 days.  Please continue to apply warm compresses to your left eyelid every 3-4 hours for 20 minutes each time.  I also recommend that you use baby shampoo to wash your eyelids and eyelashes very well twice daily.  Please go to the emergency room if you see worsening redness or have worsening pain despite these recommendations.  Thank you for visiting urgent care today.      Disposition Upon Discharge:  Condition: stable for discharge home  Patient presented with an  acute illness with associated systemic symptoms  and significant discomfort requiring urgent management. In my opinion, this is a condition that a prudent lay person (someone who possesses an average knowledge of health and medicine) may potentially expect to result in complications if not addressed urgently such as respiratory distress, impairment of bodily function or dysfunction of bodily organs.   Routine symptom specific, illness specific and/or disease specific instructions were discussed with the patient and/or caregiver at length.   As such, the patient has been evaluated and assessed, work-up was performed and treatment was provided in alignment with urgent care protocols and evidence based medicine.  Patient/parent/caregiver has been advised that the patient may require follow up for further testing and treatment if the symptoms continue in spite of treatment, as clinically indicated and appropriate.  Patient/parent/caregiver has been advised to return to the [facility] or PCP if no better; to PCP or the Emergency Department if new signs and symptoms develop, or if the current signs or symptoms continue to change or worsen for further workup, evaluation and treatment as clinically indicated and appropriate  The patient will follow up with their current PCP if and as advised. If the patient does not currently have a PCP we will assist them in obtaining one.   The patient may need specialty follow up if the symptoms continue, in spite of conservative treatment and management, for further workup, evaluation, consultation and treatment as clinically indicated and appropriate.   Patient/parent/caregiver verbalized understanding and agreement of plan as discussed.  All questions were addressed during visit.  Please see discharge instructions below for further details of plan.  This office note has been dictated using Teaching laboratory technician.  Unfortunately, this method of dictation can sometimes lead to typographical or grammatical errors.   I apologize for your inconvenience in advance if this occurs.  Please do not hesitate to reach out to me if clarification is needed.      Theadora Rama Scales, PA-C 04/03/22 2006

## 2022-04-03 NOTE — ED Triage Notes (Addendum)
The patient states she has had a stye to her left eye for 2 weeks.   Home interventions: tylenol, warm compresses.   Due date: 04/27/22

## 2022-04-06 ENCOUNTER — Emergency Department (HOSPITAL_COMMUNITY)
Admission: EM | Admit: 2022-04-06 | Discharge: 2022-04-06 | Disposition: A | Discharge status: Home / Self Care | Payer: Medicaid Other | Attending: Emergency Medicine | Admitting: Emergency Medicine

## 2022-04-06 ENCOUNTER — Other Ambulatory Visit: Payer: Self-pay

## 2022-04-06 ENCOUNTER — Encounter (HOSPITAL_COMMUNITY): Payer: Self-pay

## 2022-04-06 DIAGNOSIS — O99713 Diseases of the skin and subcutaneous tissue complicating pregnancy, third trimester: Secondary | ICD-10-CM | POA: Diagnosis present

## 2022-04-06 DIAGNOSIS — H00014 Hordeolum externum left upper eyelid: Secondary | ICD-10-CM | POA: Diagnosis not present

## 2022-04-06 NOTE — ED Provider Notes (Signed)
[facility]-EMERGENCY DEPT Provider Note   CSN: [id number] Arrival date & time: 04/06/22  0731     History  Chief Complaint  Patient presents with   Stye    Nicole Howard is a 31 y.o. female who is [redacted] weeks pregnant who presents to the emergency department for evaluation of a stye on her left upper eyelid that has been present for 2 weeks.  Patient went to urgent care 3 days ago for this complaint and was given prescription for cefdinir and advised to use warm compresses along with baby shampoo to facilitate drainage.  Patient states that her symptoms do not appear to be getting any better and continues to have painful swelling to the left upper eyelid.  She denies vision changes, eyeball pain, redness, discharge, fevers, chills.  HPI     Home Medications Prior to Admission medications   Medication Sig Start Date End Date Taking? Authorizing Provider  cefdinir (OMNICEF) 300 MG capsule Take 1 capsule (300 mg total) by mouth 2 (two) times daily for 5 days. 04/03/22 04/08/22  Theadora Rama Scales, PA-C  Misc. Devices MISC Dispense one maternity belt for patient 12/30/21   Calvert Cantor, CNM  Prenatal Vit-Fe Fumarate-FA (PREPLUS) 27-1 MG TABS Take 1 tablet by mouth daily. 08/25/21   Gerrit Heck, CNM      Allergies    Tramadol    Review of Systems   Review of Systems  Constitutional:  Negative for fever.  Eyes:  Negative for pain, discharge and redness.    Physical Exam Updated Vital Signs BP 117/83   Pulse 87   Temp 98.2 F (36.8 C)   Resp 17   Ht 5\' 5"  (1.651 m)   Wt 68 kg   LMP 07/21/2021   SpO2 99%   BMI 24.96 kg/m  Physical Exam Vitals and nursing note reviewed.  Constitutional:      General: She is not in acute distress.    Appearance: She is not ill-appearing.  HENT:     Head: Atraumatic.  Eyes:     General:        Right eye: No discharge or hordeolum.        Left eye: Hordeolum present.No discharge.     Extraocular  Movements: Extraocular movements intact.     Conjunctiva/sclera: Conjunctivae normal.     Right eye: Right conjunctiva is not injected.     Left eye: Left conjunctiva is not injected.     Comments: Hordeolum noted to left upper eyelid  Cardiovascular:     Rate and Rhythm: Normal rate and regular rhythm.     Pulses: Normal pulses.     Heart sounds: No murmur heard. Pulmonary:     Effort: Pulmonary effort is normal. No respiratory distress.     Breath sounds: Normal breath sounds.  Abdominal:     General: Abdomen is flat. There is no distension.     Palpations: Abdomen is soft.     Tenderness: There is no abdominal tenderness.  Musculoskeletal:        General: Normal range of motion.     Cervical back: Normal range of motion.  Skin:    General: Skin is warm and dry.     Capillary Refill: Capillary refill takes less than 2 seconds.  Neurological:     General: No focal deficit present.     Mental Status: She is alert.  Psychiatric:        Mood and Affect: Mood normal.  ED Results / Procedures / Treatments   Labs (all labs ordered are listed, but only abnormal results are displayed) Labs Reviewed - No data to display  EKG None  Radiology No results found.  Procedures Procedures    Medications Ordered in ED Medications - No data to display  ED Course/ Medical Decision Making/ A&P                           Medical Decision Making  31 year old pregnant female presents to the emergency department for evaluation of a stye in her left upper eyelid has been ongoing for 2 weeks.  Differentials include hordeolum, chalazion, cellulitis, preseptal cellulitis, orbital cellulitis.  Vitals are without significant abnormality.  On exam, there is nodular swelling with some erythema and tenderness on the left upper eyelid consistent with hordeolum.  Extraocular movements are intact.  No scleral injection or discharge.  I advised patient that typically, antibiotics are not effective  in treatment of hordeolum's.  Advised to continue with warm compresses and have given her a follow-up to an ophthalmologist.  She can schedule an appointment with them to determine whether I&D is required.  Patient expresses understanding and is amenable to plan.  Discharged home in stable condition. Final Clinical Impression(s) / ED Diagnoses Final diagnoses:  Hordeolum externum of left upper eyelid    Rx / DC Orders ED Discharge Orders     None         Janell Quiet, PA-C 04/06/22 0804    Mardene Sayer, MD 04/06/22 1704

## 2022-04-06 NOTE — ED Triage Notes (Signed)
PT reports a stye to her left eye for 2 weeks with not improvement. Pt is also [redacted] weeks pregnant, denies any complaints with pregnancy.

## 2022-04-06 NOTE — Discharge Instructions (Signed)
Please continue using the warm compresses and baby shampoo over your eye to help facilitate drainage of the stye.  Unfortunately, antibiotics do not typically work in these situations and if you are still experiencing swelling, pain and tenderness to the area, you will need to see an ophthalmologist to potentially drain it. I have given you a referral here to our ophthalmologist on-call-you can call their clinic to see when you can get in for evaluation and possible drainage.

## 2022-04-25 ENCOUNTER — Inpatient Hospital Stay (HOSPITAL_COMMUNITY)
Admission: AD | Admit: 2022-04-25 | Discharge: 2022-04-25 | Disposition: A | Discharge status: Home / Self Care | Payer: Medicaid Other | Attending: Obstetrics and Gynecology | Admitting: Obstetrics and Gynecology

## 2022-04-25 ENCOUNTER — Encounter (HOSPITAL_COMMUNITY): Payer: Self-pay | Admitting: Obstetrics and Gynecology

## 2022-04-25 DIAGNOSIS — Z3A39 39 weeks gestation of pregnancy: Secondary | ICD-10-CM

## 2022-04-25 DIAGNOSIS — O471 False labor at or after 37 completed weeks of gestation: Secondary | ICD-10-CM | POA: Insufficient documentation

## 2022-04-25 NOTE — MAU Note (Signed)
.  Nicole Howard is a 31 y.o. at [redacted]w[redacted]d here in MAU reporting: ctx that started around 0030, 3-4 minutes apart. Denies VB or LOF. +FM. Denies HA, visual disturbances, or epigastric pain.   Onset of complaint: 0030 Pain score: 8 Vitals:   04/25/22 0222  BP: (!) 147/83  Pulse: 92  Resp: 17  Temp: 98.3 F (36.8 C)  SpO2: 99%      FHT:EFM placed in room - 141 Lab orders placed from triage:  labor eval

## 2022-04-25 NOTE — Discharge Instructions (Signed)

## 2022-04-25 NOTE — MAU Note (Signed)
I have communicated with Cleone Slim, CNM and reviewed vital signs:  Vitals:   04/25/22 0245 04/25/22 0300  BP: 127/80 128/75  Pulse: 96 88  Resp:    Temp:    SpO2: 98% 98%    Vaginal exam:  Dilation: 1.5 Effacement (%): 50 Station: -3 Presentation: Vertex Exam by:: [employer], RN,   Also reviewed contraction pattern and that non-stress test is reactive.  It has been documented that patient is contracting every 1-6 minutes with no cervical change over 1 hour not indicating active labor.  Patient denies any other complaints.  Based on this report provider has given order for discharge.  A discharge order and diagnosis entered by a provider.   Labor discharge instructions reviewed with patient.

## 2022-04-25 NOTE — MAU Provider Note (Signed)
S: [name] is a 32 y.o. G4P2002 at [redacted]w[redacted]d  who presents to MAU today complaining contractions q 3 minutes since 0030. She denies vaginal bleeding. She denies LOF. She reports normal fetal movement.    O: BP 128/75   Pulse 88   Temp 98.3 F (36.8 C)   Resp 17   Ht 5\' 5"  (1.651 m)   Wt 69.9 kg   LMP 07/21/2021   SpO2 98%   BMI 25.64 kg/m  GENERAL: Well-developed, well-nourished female in no acute distress.  HEAD: Normocephalic, atraumatic.  CHEST: Normal effort of breathing, regular heart rate ABDOMEN: Soft, nontender, gravid  Patient Vitals for the past 24 hrs:  BP Temp Pulse Resp SpO2 Height Weight  04/25/22 0300 128/75 -- 88 -- 98 % -- --  04/25/22 0245 127/80 -- 96 -- 98 % -- --  04/25/22 0230 131/87 -- 89 -- 98 % -- --  04/25/22 0222 (!) 147/83 98.3 F (36.8 C) 92 17 99 % 5\' 5"  (1.651 m) 69.9 kg    Cervical exam:  Dilation: 1.5 Effacement (%): 50 Station: -3 Presentation: Vertex Exam by:: , RN   Fetal Monitoring: Baseline: 125 Variability: moderate Accelerations: 15x15 Decelerations: none Contractions: 1-7  Patient rechecked after 1.5 hours with no cervical change.   Initial BP mildly elevated but taken during contraction. All repeats normotensive and patient has no hx of hypertension in pregnancy. Denies any HA, visual changes or epigastric pain. BP likely pain related. Has appointment in office this week.   A: [facility] at [redacted]w[redacted]d  False labor  P: -Discharge home in stable condition -Labor precautions discussed -Patient advised to follow-up with OB as scheduled for prenatal care -Patient may return to MAU as needed or if her condition were to change or worsen   [employer], [redacted]w[redacted]d 04/25/2022 3:34 AM

## 2022-05-01 ENCOUNTER — Other Ambulatory Visit: Payer: Self-pay | Admitting: Obstetrics and Gynecology

## 2022-05-02 ENCOUNTER — Inpatient Hospital Stay (HOSPITAL_COMMUNITY): Payer: 59 | Admitting: Anesthesiology

## 2022-05-02 ENCOUNTER — Encounter (HOSPITAL_COMMUNITY): Payer: Self-pay | Admitting: Obstetrics and Gynecology

## 2022-05-02 ENCOUNTER — Other Ambulatory Visit: Payer: Self-pay

## 2022-05-02 ENCOUNTER — Inpatient Hospital Stay (HOSPITAL_COMMUNITY)
Admission: AD | Admit: 2022-05-02 | Discharge: 2022-05-04 | DRG: 807 | Disposition: A | Discharge status: Home / Self Care | Payer: 59 | Attending: Obstetrics and Gynecology | Admitting: Obstetrics and Gynecology

## 2022-05-02 DIAGNOSIS — O480 Post-term pregnancy: Principal | ICD-10-CM | POA: Diagnosis present

## 2022-05-02 DIAGNOSIS — Z3A40 40 weeks gestation of pregnancy: Secondary | ICD-10-CM | POA: Diagnosis not present

## 2022-05-02 LAB — TYPE AND SCREEN
ABO/RH(D): O POS
Antibody Screen: NEGATIVE

## 2022-05-02 LAB — CBC
HCT: 34.4 % — ABNORMAL LOW (ref 36.0–46.0)
Hemoglobin: 11.5 g/dL — ABNORMAL LOW (ref 12.0–15.0)
MCH: 29.6 pg (ref 26.0–34.0)
MCHC: 33.4 g/dL (ref 30.0–36.0)
MCV: 88.4 fL (ref 80.0–100.0)
Platelets: 419 10*3/uL — ABNORMAL HIGH (ref 150–400)
RBC: 3.89 MIL/uL (ref 3.87–5.11)
RDW: 14.9 % (ref 11.5–15.5)
WBC: 18.2 10*3/uL — ABNORMAL HIGH (ref 4.0–10.5)
nRBC: 0.9 % — ABNORMAL HIGH (ref 0.0–0.2)

## 2022-05-02 LAB — RPR: RPR Ser Ql: NONREACTIVE

## 2022-05-02 MED ORDER — OXYCODONE HCL 5 MG PO TABS
5.0000 mg | ORAL_TABLET | ORAL | Status: DC | PRN
  Administered 2022-05-02: 5 mg via ORAL
  Filled 2022-05-02: qty 1

## 2022-05-02 MED ORDER — IBUPROFEN 600 MG PO TABS
600.0000 mg | ORAL_TABLET | Freq: Four times a day (QID) | ORAL | Status: DC
  Administered 2022-05-02 – 2022-05-04 (×8): 600 mg via ORAL
  Filled 2022-05-02 (×8): qty 1

## 2022-05-02 MED ORDER — OXYCODONE HCL 5 MG PO TABS
10.0000 mg | ORAL_TABLET | ORAL | Status: DC | PRN
  Administered 2022-05-02 – 2022-05-04 (×4): 10 mg via ORAL
  Filled 2022-05-02 (×4): qty 2

## 2022-05-02 MED ORDER — WITCH HAZEL-GLYCERIN EX PADS: 1.0000 | MEDICATED_PAD | CUTANEOUS | Status: DC | PRN

## 2022-05-02 MED ORDER — LACTATED RINGERS IV SOLN
500.0000 mL | Freq: Once | INTRAVENOUS | Status: AC
  Administered 2022-05-02: 500 mL via INTRAVENOUS

## 2022-05-02 MED ORDER — TETANUS-DIPHTH-ACELL PERTUSSIS 5-2.5-18.5 LF-MCG/0.5 IM SUSY: 0.5000 mL | PREFILLED_SYRINGE | Freq: Once | INTRAMUSCULAR | Status: DC

## 2022-05-02 MED ORDER — COCONUT OIL OIL: 1.0000 | TOPICAL_OIL | Status: DC | PRN

## 2022-05-02 MED ORDER — EPHEDRINE 5 MG/ML INJ
10.0000 mg | INTRAVENOUS | Status: DC | PRN
  Administered 2022-05-02: 10 mg via INTRAVENOUS

## 2022-05-02 MED ORDER — ZOLPIDEM TARTRATE 5 MG PO TABS: 5.0000 mg | ORAL_TABLET | Freq: Every evening | ORAL | Status: DC | PRN

## 2022-05-02 MED ORDER — LIDOCAINE HCL (PF) 1 % IJ SOLN: 30.0000 mL | INTRAMUSCULAR | Status: DC | PRN

## 2022-05-02 MED ORDER — OXYTOCIN-SODIUM CHLORIDE 30-0.9 UT/500ML-% IV SOLN
2.5000 [IU]/h | INTRAVENOUS | Status: DC
  Administered 2022-05-02: 2.5 [IU]/h via INTRAVENOUS
  Filled 2022-05-02: qty 500

## 2022-05-02 MED ORDER — EPHEDRINE 5 MG/ML INJ
10.0000 mg | INTRAVENOUS | Status: DC | PRN
  Filled 2022-05-02: qty 5

## 2022-05-02 MED ORDER — ONDANSETRON HCL 4 MG PO TABS: 4.0000 mg | ORAL_TABLET | ORAL | Status: DC | PRN

## 2022-05-02 MED ORDER — ONDANSETRON HCL 4 MG/2ML IJ SOLN: 4.0000 mg | INTRAMUSCULAR | Status: DC | PRN

## 2022-05-02 MED ORDER — DIBUCAINE (PERIANAL) 1 % EX OINT: 1.0000 | TOPICAL_OINTMENT | CUTANEOUS | Status: DC | PRN

## 2022-05-02 MED ORDER — SOD CITRATE-CITRIC ACID 500-334 MG/5ML PO SOLN: 30.0000 mL | ORAL | Status: DC | PRN

## 2022-05-02 MED ORDER — PRENATAL MULTIVITAMIN CH
1.0000 | ORAL_TABLET | Freq: Every day | ORAL | Status: DC
  Administered 2022-05-03 – 2022-05-04 (×2): 1 via ORAL
  Filled 2022-05-02 (×2): qty 1

## 2022-05-02 MED ORDER — DIPHENHYDRAMINE HCL 50 MG/ML IJ SOLN: 12.5000 mg | INTRAMUSCULAR | Status: DC | PRN

## 2022-05-02 MED ORDER — ONDANSETRON HCL 4 MG/2ML IJ SOLN: 4.0000 mg | Freq: Four times a day (QID) | INTRAMUSCULAR | Status: DC | PRN

## 2022-05-02 MED ORDER — OXYCODONE-ACETAMINOPHEN 5-325 MG PO TABS: 1.0000 | ORAL_TABLET | ORAL | Status: DC | PRN

## 2022-05-02 MED ORDER — LIDOCAINE-EPINEPHRINE (PF) 1.5 %-1:200000 IJ SOLN
INTRAMUSCULAR | Status: DC | PRN
  Administered 2022-05-02: 5 mL via EPIDURAL

## 2022-05-02 MED ORDER — OXYCODONE-ACETAMINOPHEN 5-325 MG PO TABS
1.0000 | ORAL_TABLET | Freq: Once | ORAL | Status: AC
  Administered 2022-05-02: 1 via ORAL
  Filled 2022-05-02: qty 1

## 2022-05-02 MED ORDER — ACETAMINOPHEN 325 MG PO TABS: 650.0000 mg | ORAL_TABLET | ORAL | Status: DC | PRN

## 2022-05-02 MED ORDER — FENTANYL-BUPIVACAINE-NACL 0.5-0.125-0.9 MG/250ML-% EP SOLN
12.0000 mL/h | EPIDURAL | Status: DC | PRN
  Administered 2022-05-02: 12 mL/h via EPIDURAL
  Filled 2022-05-02: qty 250

## 2022-05-02 MED ORDER — FENTANYL CITRATE (PF) 100 MCG/2ML IJ SOLN
50.0000 ug | INTRAMUSCULAR | Status: DC | PRN
  Administered 2022-05-02: 50 ug via INTRAVENOUS
  Filled 2022-05-02: qty 2

## 2022-05-02 MED ORDER — SIMETHICONE 80 MG PO CHEW: 80.0000 mg | CHEWABLE_TABLET | ORAL | Status: DC | PRN

## 2022-05-02 MED ORDER — OXYTOCIN-SODIUM CHLORIDE 30-0.9 UT/500ML-% IV SOLN: 2.5000 [IU]/h | INTRAVENOUS | Status: DC | PRN

## 2022-05-02 MED ORDER — LACTATED RINGERS IV SOLN
INTRAVENOUS | Status: DC
  Administered 2022-05-02: 125 mL via INTRAVENOUS

## 2022-05-02 MED ORDER — OXYCODONE-ACETAMINOPHEN 5-325 MG PO TABS: 2.0000 | ORAL_TABLET | ORAL | Status: DC | PRN

## 2022-05-02 MED ORDER — PHENYLEPHRINE 80 MCG/ML (10ML) SYRINGE FOR IV PUSH (FOR BLOOD PRESSURE SUPPORT)
80.0000 ug | PREFILLED_SYRINGE | INTRAVENOUS | Status: AC | PRN
  Administered 2022-05-02 (×3): 80 ug via INTRAVENOUS

## 2022-05-02 MED ORDER — PHENYLEPHRINE 80 MCG/ML (10ML) SYRINGE FOR IV PUSH (FOR BLOOD PRESSURE SUPPORT)
80.0000 ug | PREFILLED_SYRINGE | INTRAVENOUS | Status: DC | PRN
  Filled 2022-05-02: qty 10

## 2022-05-02 MED ORDER — ACETAMINOPHEN 325 MG PO TABS
650.0000 mg | ORAL_TABLET | ORAL | Status: DC | PRN
  Administered 2022-05-02 – 2022-05-03 (×5): 650 mg via ORAL
  Filled 2022-05-02 (×5): qty 2

## 2022-05-02 MED ORDER — BENZOCAINE-MENTHOL 20-0.5 % EX AERO: 1.0000 | INHALATION_SPRAY | CUTANEOUS | Status: DC | PRN

## 2022-05-02 MED ORDER — OXYTOCIN BOLUS FROM INFUSION
333.0000 mL | Freq: Once | INTRAVENOUS | Status: AC
  Administered 2022-05-02: 333 mL via INTRAVENOUS

## 2022-05-02 MED ORDER — DIPHENHYDRAMINE HCL 25 MG PO CAPS: 25.0000 mg | ORAL_CAPSULE | Freq: Four times a day (QID) | ORAL | Status: DC | PRN

## 2022-05-02 MED ORDER — SENNOSIDES-DOCUSATE SODIUM 8.6-50 MG PO TABS
2.0000 | ORAL_TABLET | Freq: Every day | ORAL | Status: DC
  Administered 2022-05-03 – 2022-05-04 (×2): 2 via ORAL
  Filled 2022-05-02 (×3): qty 2

## 2022-05-02 MED ORDER — ONDANSETRON 4 MG PO TBDP
4.0000 mg | ORAL_TABLET | Freq: Once | ORAL | Status: AC
  Administered 2022-05-02: 4 mg via ORAL
  Filled 2022-05-02: qty 1

## 2022-05-02 MED ORDER — LACTATED RINGERS IV SOLN
500.0000 mL | INTRAVENOUS | Status: DC | PRN
  Administered 2022-05-02: 500 mL via INTRAVENOUS

## 2022-05-02 NOTE — Anesthesia Postprocedure Evaluation (Signed)
Anesthesia Post Note  Patient: Dalphine Cowie  Procedure(s) Performed: AN AD HOC LABOR EPIDURAL     Patient location during evaluation: Mother Baby Anesthesia Type: Epidural Level of consciousness: awake and alert Pain management: pain level controlled Vital Signs Assessment: post-procedure vital signs reviewed and stable Respiratory status: spontaneous breathing, nonlabored ventilation and respiratory function stable Cardiovascular status: stable Postop Assessment: no headache, no backache and epidural receding Anesthetic complications: no   No notable events documented.  Last Vitals:  Vitals:   05/02/22 1522 05/02/22 1630  BP: 129/81 (!) 144/84  Pulse: 76 79  Resp: 16 16  Temp: 36.7 C 37.1 C  SpO2: 100% 100%    Last Pain:  Vitals:   05/02/22 1630  TempSrc:   PainSc: 7    Pain Goal:                   [employer]

## 2022-05-02 NOTE — Anesthesia Preprocedure Evaluation (Addendum)
Anesthesia Evaluation  Patient identified by MRN, date of birth, ID band Patient awake    Reviewed: Allergy & Precautions, NPO status , Patient's Chart, lab work & pertinent test results  Airway Mallampati: II  TM Distance: >3 FB Neck ROM: Full    Dental no notable dental hx.    Pulmonary neg pulmonary ROS,    Pulmonary exam normal        Cardiovascular negative cardio ROS   Rhythm:Regular Rate:Normal     Neuro/Psych  Headaches, negative psych ROS   GI/Hepatic negative GI ROS, Neg liver ROS,   Endo/Other  negative endocrine ROS  Renal/GU negative Renal ROS  negative genitourinary   Musculoskeletal negative musculoskeletal ROS (+)   Abdominal Normal abdominal exam  (+)   Peds  Hematology negative hematology ROS (+)   Anesthesia Other Findings   Reproductive/Obstetrics (+) Pregnancy                             Anesthesia Physical Anesthesia Plan  ASA: 2  Anesthesia Plan: Epidural   Post-op Pain Management:    Induction:   PONV Risk Score and Plan: 2 and Treatment may vary due to age or medical condition  Airway Management Planned: Natural Airway  Additional Equipment: None  Intra-op Plan:   Post-operative Plan:   Informed Consent: I have reviewed the patients History and Physical, chart, labs and discussed the procedure including the risks, benefits and alternatives for the proposed anesthesia with the patient or authorized representative who has indicated his/her understanding and acceptance.     Dental advisory given  Plan Discussed with:   Anesthesia Plan Comments: (Lab Results      Component                Value               Date                      WBC                      18.2 (H)            05/02/2022                HGB                      11.5 (L)            05/02/2022                HCT                      34.4 (L)            05/02/2022                 MCV                      88.4                05/02/2022                PLT                      419 (H)             05/02/2022          )  Anesthesia Quick Evaluation  

## 2022-05-02 NOTE — MAU Note (Signed)
.  Nicole Howard is a 31 y.o. at [redacted]w[redacted]d here in MAU reporting: ctx since 1. Pt reports lost mucus plug with bloody show.PT denies VB, LOF, SFM, PIH s/s, recent intercourse, and complications in the pregnancy. IOL scheduled 05/07/2022  GBS neg SVE 2 cm Onset of complaint: 0445 Pain score: 10/10 Vitals:   05/02/22 0550  BP: 130/87  Pulse: 82  Resp: 18  Temp: 98.2 F (36.8 C)  SpO2: 97%     FHT:130 Lab orders placed from triage:

## 2022-05-02 NOTE — Anesthesia Procedure Notes (Signed)
Epidural Patient location during procedure: OB Start time: 05/02/2022 10:03 AM End time: 05/02/2022 10:10 AM  Staffing Anesthesiologist: Atilano Median, DO Performed: anesthesiologist   Preanesthetic Checklist Completed: patient identified, IV checked, site marked, risks and benefits discussed, surgical consent, monitors and equipment checked, pre-op evaluation and timeout performed  Epidural Patient position: sitting Prep: ChloraPrep Patient monitoring: heart rate, continuous pulse ox and blood pressure Approach: midline Location: L3-L4 Injection technique: LOR saline  Needle:  Needle type: Tuohy  Needle gauge: 17 G Needle length: 9 cm Needle insertion depth: 7 cm Catheter type: closed end flexible Catheter size: 20 Guage Catheter at skin depth: 12 cm Test dose: 1.5% lidocaine with Epi 1:200 K and negative  Assessment Events: blood not aspirated, injection not painful, no injection resistance and no paresthesia  Additional Notes Patient identified. Risks/Benefits/Options discussed with patient including but not limited to bleeding, infection, nerve damage, paralysis, failed block, incomplete pain control, headache, blood pressure changes, nausea, vomiting, reactions to medications, itching and postpartum back pain. Confirmed with bedside nurse the patient's most recent platelet count. Confirmed with patient that they are not currently taking any anticoagulation, have any bleeding history or any family history of bleeding disorders. Patient expressed understanding and wished to proceed. All questions were answered. Sterile technique was used throughout the entire procedure. Please see nursing notes for vital signs. Test dose was given through epidural catheter and negative prior to continuing to dose epidural or start infusion. Warning signs of high block given to the patient including shortness of breath, tingling/numbness in hands, complete motor block, or any concerning  symptoms with instructions to call for help. Patient was given instructions on fall risk and not to get out of bed. All questions and concerns addressed with instructions to call with any issues or inadequate analgesia.    Reason for block:procedure for pain

## 2022-05-02 NOTE — H&P (Signed)
Shailah Gibbins is a 31 y.o. female presenting for labor . OB History     Gravida  4   Para  2   Term  2   Preterm      AB      Living  2      SAB      IAB      Ectopic      Multiple      Live Births  2          Past Medical History:  Diagnosis Date   Headache    Medical history non-contributory    Past Surgical History:  Procedure Laterality Date   DILATION AND CURETTAGE, DIAGNOSTIC / THERAPEUTIC  10/2020   FOREIGN BODY REMOVAL Left 09/18/2015   Procedure: FOREIGN BODY REMOVAL OF BIRTH CONTROL DEVICE IN LEFT ARM ADULT;  Surgeon: Luretha Murphy, MD;  Location: [facility];  Service: General;  Laterality: Left;   NO PAST SURGERIES     Family History: family history is not on file. She was adopted. Social History:  reports that she has never smoked. She has never used smokeless tobacco. She reports that she does not drink alcohol and does not use drugs.     Maternal Diabetes: No Genetic Screening: Normal Maternal Ultrasounds/Referrals: Normal Fetal Ultrasounds or other Referrals:  None Maternal Substance Abuse:  No Significant Maternal Medications:  Meds include: Other: vitamin D Significant Maternal Lab Results:  Group B Strep negative Number of Prenatal Visits:greater than 3 verified prenatal visits Other Comments:   h/o DIC  Review of Systems Denies F/C/N/V/D  History Dilation: 9 Effacement (%): 90 Station: 0 Exam by:: Sandy Salaam, RN Blood pressure 138/72, pulse 80, temperature (!) 97.4 F (36.3 C), resp. rate 18, height 5\' 5"  (1.651 m), weight 69.9 kg, last menstrual period 07/21/2021, SpO2 100 %, unknown if currently breastfeeding. Exam Physical Exam  Lungs CTA CV RRR Abdomen gravid, NT Ext no calf tenderness  Prenatal labs: ABO, Rh: --/--/O POS (09/14 0845) Antibody: NEG (09/14 0845) Rubella:  Immune RPR:   NR HBsAg:   Negative HIV:   NR GBS:   Negative   Assessment/Plan: P2 at 40 5/7wks in labor.   Comfortable now with epidural.  Attempted AROM but no palpable membranes and no return of fluid.  Cat 1 tracing.  Expectant management anticipating NSVD.   01-[birth year]-2005 05/02/2022, 11:50 AM

## 2022-05-03 ENCOUNTER — Encounter (HOSPITAL_COMMUNITY): Payer: Self-pay | Admitting: Obstetrics and Gynecology

## 2022-05-03 LAB — CBC
HCT: 28.1 % — ABNORMAL LOW (ref 36.0–46.0)
Hemoglobin: 9.4 g/dL — ABNORMAL LOW (ref 12.0–15.0)
MCH: 29.8 pg (ref 26.0–34.0)
MCHC: 33.5 g/dL (ref 30.0–36.0)
MCV: 89.2 fL (ref 80.0–100.0)
Platelets: 339 10*3/uL (ref 150–400)
RBC: 3.15 MIL/uL — ABNORMAL LOW (ref 3.87–5.11)
RDW: 14.6 % (ref 11.5–15.5)
WBC: 22.8 10*3/uL — ABNORMAL HIGH (ref 4.0–10.5)
nRBC: 0.5 % — ABNORMAL HIGH (ref 0.0–0.2)

## 2022-05-03 NOTE — Progress Notes (Signed)
PPD# 1 Vacuum-assisted vaginal delivery w/intact perineum  Information for the patient's newborn:  Porchia, Sinkler [[id number]]  female    Circumcision before discharge   S:   Reports feeling good Tolerating PO fluid and solids No nausea or vomiting Bleeding is light, no clots Pain controlled with acetaminophen and ibuprofen (OTC) Up ad lib / ambulatory / voiding w/o difficulty Feeding:  formula     O:   VS: BP 109/65 (BP Location: Right Arm)   Pulse 74   Temp 99 F (37.2 C) (Oral)   Resp 16   Ht 5\' 5"  (1.651 m)   Wt 69.9 kg   LMP 07/21/2021   SpO2 100%   Breastfeeding Unknown   BMI 25.63 kg/m   LABS:  Recent Labs    05/02/22 0843 05/03/22 0455  WBC 18.2* 22.8*  HGB 11.5* 9.4*  PLT 419* 339   Blood type: --/--/O POS (09/14 0845) Rubella:                        I&O: Intake/Output      09/14 0701 09/15 0700 09/15 0701 09/16 0700   I.V. (mL/kg) 24.3 (0.3)    Other 44.4    Total Intake(mL/kg) 68.7 (1)    Urine (mL/kg/hr) 300 (0.2)    Blood 100    Total Output 400    Net -331.3           Physical Exam: Alert and oriented X3 Lungs: Clear and unlabored Heart: regular rate and rhythm / no mumurs Abdomen: soft, non-tender, non-distended  Fundus: firm, non-tender, U-1 Perineum: intact Lochia: appropriate Extremities: no edema, no calf pain, tenderness, or cords    A:  PPD # 1  Normal exam  P:  Routine post partum orders Plans interval permanent sterilization Anticipate D/C on 05/04/22  Plan reviewed w/ Dr. 05/06/22, DNP, CNM 05/03/2022, 1:59 PM

## 2022-05-04 MED ORDER — ACETAMINOPHEN 325 MG PO TABS: 650.0000 mg | ORAL_TABLET | ORAL | Status: AC | PRN

## 2022-05-04 MED ORDER — IBUPROFEN 600 MG PO TABS: 600.0000 mg | ORAL_TABLET | Freq: Four times a day (QID) | ORAL | 0 refills | Status: DC

## 2022-05-04 MED ORDER — OXYCODONE HCL 5 MG PO TABS: 5.0000 mg | ORAL_TABLET | Freq: Four times a day (QID) | ORAL | 0 refills | Status: AC | PRN

## 2022-05-04 NOTE — Discharge Summary (Signed)
Postpartum Discharge Summary  Date of Service updated 05/04/22    Patient Name: Nicole Howard DOB: April 23, 1991 MRN: [id number]  Date of admission: 05/02/2022 Delivery date:05/02/2022  Delivering provider: Everett Graff  Date of discharge: 05/04/2022  Admitting diagnosis: Normal labor [O80, Z37.9] Intrauterine pregnancy: 106w5d    Secondary diagnosis:  Principal Problem:   Normal labor  Additional problems: none    Discharge diagnosis: Term Pregnancy Delivered                                              Post partum procedures: none Augmentation: N/A Complications: None  Hospital course: Onset of Labor With Vaginal Delivery      31y.o. yo [id number] 466w5das admitted in Active Labor on 05/02/2022. Patient had an uncomplicated labor course as follows:  Membrane Rupture Time/Date: 11:00 AM ,05/02/2022   Delivery Method:Vaginal, Vacuum (Extractor)  Episiotomy: None  Lacerations:  None  Patient had an uncomplicated postpartum course.  She is ambulating, tolerating a regular diet, passing flatus, and urinating well. Patient is discharged home in stable condition on 05/04/22.  Newborn Data: Birth date:05/02/2022  Birth time:1:48 PM  Gender:Female  Living status:Living  Apgars:9 ,9  Weight:3060 g   Magnesium Sulfate received: No BMZ received: No Rhophylac:N/A MMR:N/A Transfusion:No  Physical exam  Vitals:   05/03/22 0503 05/03/22 1400 05/03/22 2241 05/04/22 0521  BP: 109/65 124/76 111/72 108/75  Pulse: 74 73 73 83  Resp: 16 18 17 18   Temp: 99 F (37.2 C) 98 F (36.7 C) 98.3 F (36.8 C) 98.6 F (37 C)  TempSrc: Oral Axillary Oral Oral  SpO2: 100% 99% 99% 99%  Weight:      Height:       General: alert, cooperative, and no distress Lochia: appropriate Uterine Fundus: firm Incision: N/A DVT Evaluation: No evidence of DVT seen on physical exam. No cords or calf tenderness. No significant calf/ankle edema. Labs: Lab Results  Component Value Date   WBC 22.8  (H) 05/03/2022   HGB 9.4 (L) 05/03/2022   HCT 28.1 (L) 05/03/2022   MCV 89.2 05/03/2022   PLT 339 05/03/2022      Latest Ref Rng & Units 01/10/2021    8:44 AM  CMP  Glucose 70 - 99 mg/dL 92   BUN 6 - 20 mg/dL 7   Creatinine 0.44 - 1.00 mg/dL 0.79   Sodium 135 - 145 mmol/L 138   Potassium 3.5 - 5.1 mmol/L 3.8   Chloride 98 - 111 mmol/L 105   CO2 22 - 32 mmol/L 26   Calcium 8.9 - 10.3 mg/dL 8.9   Total Protein 6.5 - 8.1 g/dL 7.0   Total Bilirubin 0.3 - 1.2 mg/dL 1.0   Alkaline Phos 38 - 126 U/L 57   AST 15 - 41 U/L 20   ALT 0 - 44 U/L 21    Edinburgh Score:    05/02/2022    3:22 PM  Edinburgh Postnatal Depression Scale Screening Tool  I have been able to laugh and see the funny side of things. 0  I have looked forward with enjoyment to things. 0  I have blamed myself unnecessarily when things went wrong. 0  I have been anxious or worried for no good reason. 0  I have felt scared or panicky for no good reason. 0  Things have been getting on  top of me. 0  I have been so unhappy that I have had difficulty sleeping. 0  I have felt sad or miserable. 0  I have been so unhappy that I have been crying. 0  The thought of harming myself has occurred to me. 0  Edinburgh Postnatal Depression Scale Total 0      After visit meds:  Allergies as of 05/04/2022       Reactions   Tramadol Nausea And Vomiting        Medication List     STOP taking these medications    Misc. Devices Misc       TAKE these medications    acetaminophen 325 MG tablet Commonly known as: Tylenol Take 2 tablets (650 mg total) by mouth every 4 (four) hours as needed (for pain scale < 4).   ferrous sulfate 325 (65 FE) MG tablet Take 325 mg by mouth daily with breakfast.   ibuprofen 600 MG tablet Commonly known as: ADVIL Take 1 tablet (600 mg total) by mouth every 6 (six) hours.   oxyCODONE 5 MG immediate release tablet Commonly known as: Oxy IR/ROXICODONE Take 1 tablet (5 mg total) by mouth  every 6 (six) hours as needed for up to 2 days for severe pain or breakthrough pain.   PrePLUS 27-1 MG Tabs Take 1 tablet by mouth daily.         Discharge home in stable condition Infant Feeding:  formula Infant Disposition:home with mother Discharge instruction: per After Visit Summary and Postpartum booklet. Activity: Advance as tolerated. Pelvic rest for 6 weeks.  Diet: routine diet Anticipated Birth Control: Plans Interval BTL Postpartum Appointment:4 weeks Additional Postpartum F/U:  none Future Appointments:No future appointments. Follow up Visit:  Follow-up Information     Dillard, Naima, MD Follow up in 4 week(s).   Specialty: Obstetrics and Gynecology Why: Expect a call from [facility] to schedule your tubal ligation and then return in 4-6 weeks for your postpartum visit. Contact information: [place] [address] [phone]                     05/04/2022 Arrie Eastern, CNM

## 2022-05-09 ENCOUNTER — Encounter (HOSPITAL_COMMUNITY): Payer: Self-pay | Admitting: Obstetrics and Gynecology

## 2022-05-11 ENCOUNTER — Telehealth (HOSPITAL_COMMUNITY): Payer: Self-pay | Admitting: *Deleted

## 2022-05-11 NOTE — Telephone Encounter (Signed)
Left phone voicemail message.  Odis Hollingshead, RN 05-11-2022 at 2:58pm

## 2022-06-14 ENCOUNTER — Other Ambulatory Visit: Payer: Self-pay | Admitting: Obstetrics and Gynecology

## 2022-08-07 ENCOUNTER — Encounter (HOSPITAL_BASED_OUTPATIENT_CLINIC_OR_DEPARTMENT_OTHER): Payer: Self-pay | Admitting: Obstetrics and Gynecology

## 2022-08-07 NOTE — Progress Notes (Signed)
Spoke w/ via phone for pre-op interview--- pt Lab needs dos---- urine preg              Lab results------ no COVID test -----patient states asymptomatic no test needed Arrive at -------  1100 on 08-09-2022 NPO after MN NO Solid Food.  Clear liquids from MN until--- 1000 Med rec completed Medications to take morning of surgery ----- none Diabetic medication ----- n/a Patient instructed no nail polish to be worn day of surgery Patient instructed to bring photo id and insurance card day of surgery Patient aware to have Driver (ride ) / caregiver    for 24 hours after surgery--- sister, lashenna Patient Special Instructions ----- n/a Pre-Op special Istructions ----- n/a Patient verbalized understanding of instructions that were given at this phone interview. Patient denies shortness of breath, chest pain, fever, cough at this phone interview.

## 2022-08-09 ENCOUNTER — Ambulatory Visit (HOSPITAL_BASED_OUTPATIENT_CLINIC_OR_DEPARTMENT_OTHER): Payer: 59 | Admitting: Anesthesiology

## 2022-08-09 ENCOUNTER — Encounter (HOSPITAL_BASED_OUTPATIENT_CLINIC_OR_DEPARTMENT_OTHER): Payer: Self-pay | Admitting: Obstetrics and Gynecology

## 2022-08-09 ENCOUNTER — Encounter (HOSPITAL_BASED_OUTPATIENT_CLINIC_OR_DEPARTMENT_OTHER)
Admission: RE | Disposition: A | Discharge status: Home / Self Care | Payer: Self-pay | Source: Ambulatory Visit | Attending: Obstetrics and Gynecology

## 2022-08-09 ENCOUNTER — Other Ambulatory Visit: Payer: Self-pay

## 2022-08-09 ENCOUNTER — Ambulatory Visit (HOSPITAL_BASED_OUTPATIENT_CLINIC_OR_DEPARTMENT_OTHER)
Admission: RE | Admit: 2022-08-09 | Discharge: 2022-08-09 | Disposition: A | Discharge status: Home / Self Care | Payer: 59 | Source: Ambulatory Visit | Attending: Obstetrics and Gynecology | Admitting: Obstetrics and Gynecology

## 2022-08-09 DIAGNOSIS — Z302 Encounter for sterilization: Secondary | ICD-10-CM | POA: Diagnosis present

## 2022-08-09 DIAGNOSIS — Z01818 Encounter for other preprocedural examination: Secondary | ICD-10-CM

## 2022-08-09 HISTORY — DX: Iron deficiency anemia, unspecified: D50.9

## 2022-08-09 HISTORY — PX: LAPAROSCOPIC BILATERAL SALPINGECTOMY: SHX5889

## 2022-08-09 LAB — POCT PREGNANCY, URINE: Preg Test, Ur: NEGATIVE

## 2022-08-09 SURGERY — SALPINGECTOMY, BILATERAL, LAPAROSCOPIC
Anesthesia: General | Site: Abdomen | Laterality: Bilateral

## 2022-08-09 MED ORDER — ONDANSETRON HCL 4 MG/2ML IJ SOLN
INTRAMUSCULAR | Status: DC | PRN
  Administered 2022-08-09: 4 mg via INTRAVENOUS

## 2022-08-09 MED ORDER — OXYCODONE HCL 5 MG/5ML PO SOLN: 5.0000 mg | Freq: Once | ORAL | Status: AC | PRN

## 2022-08-09 MED ORDER — OXYCODONE HCL 5 MG PO TABS
5.0000 mg | ORAL_TABLET | Freq: Once | ORAL | Status: AC | PRN
  Administered 2022-08-09: 5 mg via ORAL

## 2022-08-09 MED ORDER — DEXAMETHASONE SODIUM PHOSPHATE 10 MG/ML IJ SOLN
INTRAMUSCULAR | Status: AC
  Filled 2022-08-09: qty 1

## 2022-08-09 MED ORDER — SODIUM CHLORIDE (PF) 0.9 % IJ SOLN
INTRAMUSCULAR | Status: AC
  Filled 2022-08-09: qty 10

## 2022-08-09 MED ORDER — SCOPOLAMINE 1 MG/3DAYS TD PT72
MEDICATED_PATCH | TRANSDERMAL | Status: AC
  Filled 2022-08-09: qty 1

## 2022-08-09 MED ORDER — LACTATED RINGERS IV SOLN: INTRAVENOUS | Status: DC

## 2022-08-09 MED ORDER — IBUPROFEN 600 MG PO TABS: 600.0000 mg | ORAL_TABLET | Freq: Four times a day (QID) | ORAL | 1 refills | Status: AC | PRN

## 2022-08-09 MED ORDER — ROCURONIUM BROMIDE 100 MG/10ML IV SOLN
INTRAVENOUS | Status: DC | PRN
  Administered 2022-08-09: 60 mg via INTRAVENOUS

## 2022-08-09 MED ORDER — ONDANSETRON HCL 4 MG/2ML IJ SOLN: 4.0000 mg | Freq: Once | INTRAMUSCULAR | Status: DC | PRN

## 2022-08-09 MED ORDER — OXYCODONE HCL 5 MG PO TABS
ORAL_TABLET | ORAL | Status: AC
  Filled 2022-08-09: qty 1

## 2022-08-09 MED ORDER — DEXMEDETOMIDINE HCL IN NACL 80 MCG/20ML IV SOLN
INTRAVENOUS | Status: DC | PRN
  Administered 2022-08-09: 12 ug via BUCCAL

## 2022-08-09 MED ORDER — MIDAZOLAM HCL 5 MG/5ML IJ SOLN
INTRAMUSCULAR | Status: DC | PRN
  Administered 2022-08-09: 2 mg via INTRAVENOUS

## 2022-08-09 MED ORDER — PROPOFOL 10 MG/ML IV BOLUS
INTRAVENOUS | Status: DC | PRN
  Administered 2022-08-09: 70 mg via INTRAVENOUS
  Administered 2022-08-09: 130 mg via INTRAVENOUS

## 2022-08-09 MED ORDER — HYDROMORPHONE HCL 1 MG/ML IJ SOLN
INTRAMUSCULAR | Status: AC
  Filled 2022-08-09: qty 1

## 2022-08-09 MED ORDER — SUGAMMADEX SODIUM 200 MG/2ML IV SOLN
INTRAVENOUS | Status: DC | PRN
  Administered 2022-08-09: 100 mg via INTRAVENOUS
  Administered 2022-08-09: 200 mg via INTRAVENOUS

## 2022-08-09 MED ORDER — PROPOFOL 10 MG/ML IV BOLUS
INTRAVENOUS | Status: AC
  Filled 2022-08-09: qty 20

## 2022-08-09 MED ORDER — ONDANSETRON HCL 4 MG/2ML IJ SOLN
INTRAMUSCULAR | Status: AC
  Filled 2022-08-09: qty 2

## 2022-08-09 MED ORDER — ROCURONIUM BROMIDE 10 MG/ML (PF) SYRINGE
PREFILLED_SYRINGE | INTRAVENOUS | Status: AC
  Filled 2022-08-09: qty 10

## 2022-08-09 MED ORDER — CEFAZOLIN SODIUM-DEXTROSE 2-3 GM-%(50ML) IV SOLR
INTRAVENOUS | Status: DC | PRN
  Administered 2022-08-09: 2 g via INTRAVENOUS

## 2022-08-09 MED ORDER — BUPIVACAINE HCL (PF) 0.25 % IJ SOLN
INTRAMUSCULAR | Status: DC | PRN
  Administered 2022-08-09: 15 mL

## 2022-08-09 MED ORDER — DROPERIDOL 2.5 MG/ML IJ SOLN: 0.6250 mg | Freq: Once | INTRAMUSCULAR | Status: DC | PRN

## 2022-08-09 MED ORDER — DEXAMETHASONE SODIUM PHOSPHATE 10 MG/ML IJ SOLN
INTRAMUSCULAR | Status: DC | PRN
  Administered 2022-08-09: 5 mg via INTRAVENOUS

## 2022-08-09 MED ORDER — FENTANYL CITRATE (PF) 250 MCG/5ML IJ SOLN
INTRAMUSCULAR | Status: AC
  Filled 2022-08-09: qty 5

## 2022-08-09 MED ORDER — SCOPOLAMINE 1 MG/3DAYS TD PT72
1.0000 | MEDICATED_PATCH | TRANSDERMAL | Status: DC
  Administered 2022-08-09: 1.5 mg via TRANSDERMAL

## 2022-08-09 MED ORDER — LIDOCAINE 2% (20 MG/ML) 5 ML SYRINGE
INTRAMUSCULAR | Status: DC | PRN
  Administered 2022-08-09: 80 mg via INTRAVENOUS

## 2022-08-09 MED ORDER — MIDAZOLAM HCL 2 MG/2ML IJ SOLN
INTRAMUSCULAR | Status: AC
  Filled 2022-08-09: qty 2

## 2022-08-09 MED ORDER — FENTANYL CITRATE (PF) 100 MCG/2ML IJ SOLN
INTRAMUSCULAR | Status: DC | PRN
  Administered 2022-08-09: 50 ug via INTRAVENOUS
  Administered 2022-08-09: 100 ug via INTRAVENOUS

## 2022-08-09 MED ORDER — CEFAZOLIN SODIUM 1 G IJ SOLR
INTRAMUSCULAR | Status: AC
  Filled 2022-08-09: qty 20

## 2022-08-09 MED ORDER — KETOROLAC TROMETHAMINE 30 MG/ML IJ SOLN
INTRAMUSCULAR | Status: DC | PRN
  Administered 2022-08-09: 30 mg via INTRAVENOUS

## 2022-08-09 MED ORDER — HYDROMORPHONE HCL 1 MG/ML IJ SOLN
0.2500 mg | INTRAMUSCULAR | Status: DC | PRN
  Administered 2022-08-09: 0.5 mg via INTRAVENOUS
  Administered 2022-08-09 (×2): 0.25 mg via INTRAVENOUS

## 2022-08-09 MED ORDER — 0.9 % SODIUM CHLORIDE (POUR BTL) OPTIME
TOPICAL | Status: DC | PRN
  Administered 2022-08-09: 500 mL

## 2022-08-09 MED ORDER — POVIDONE-IODINE 10 % EX SWAB: 2.0000 | Freq: Once | CUTANEOUS | Status: DC

## 2022-08-09 MED ORDER — OXYCODONE HCL 5 MG PO TABS: 5.0000 mg | ORAL_TABLET | Freq: Four times a day (QID) | ORAL | 0 refills | Status: AC | PRN

## 2022-08-09 SURGICAL SUPPLY — 33 items
ADH SKN CLS APL DERMABOND .7 (GAUZE/BANDAGES/DRESSINGS) ×1
COVER MAYO STAND STRL (DRAPES) ×1 IMPLANT
DERMABOND ADVANCED .7 DNX12 (GAUZE/BANDAGES/DRESSINGS) ×1 IMPLANT
DURAPREP 26ML APPLICATOR (WOUND CARE) ×1 IMPLANT
GLOVE BIO SURGEON STRL SZ7 (GLOVE) IMPLANT
GLOVE BIO SURGEON STRL SZ7.5 (GLOVE) ×1 IMPLANT
GLOVE BIOGEL PI IND STRL 6.5 (GLOVE) IMPLANT
GLOVE BIOGEL PI IND STRL 7.0 (GLOVE) ×2 IMPLANT
GLOVE BIOGEL PI IND STRL 7.5 (GLOVE) ×2 IMPLANT
GLOVE ECLIPSE 6.5 STRL STRAW (GLOVE) IMPLANT
GOWN STRL REUS W/ TWL LRG LVL3 (GOWN DISPOSABLE) IMPLANT
GOWN STRL REUS W/TWL LRG LVL3 (GOWN DISPOSABLE) ×2 IMPLANT
KIT PINK PAD W/HEAD ARE REST (MISCELLANEOUS) ×1
KIT PINK PAD W/HEAD ARM REST (MISCELLANEOUS) ×1 IMPLANT
KIT TURNOVER CYSTO (KITS) ×1 IMPLANT
LIGASURE VESSEL 5MM BLUNT TIP (ELECTROSURGICAL) ×1 IMPLANT
NDL INSUFFLATION 14GA 120MM (NEEDLE) ×1 IMPLANT
NDL SAFETY ECLIP 18X1.5 (MISCELLANEOUS) IMPLANT
NEEDLE INSUFFLATION 14GA 120MM (NEEDLE) ×1 IMPLANT
NS IRRIG 500ML POUR BTL (IV SOLUTION) IMPLANT
PACK LAPAROSCOPY BASIN (CUSTOM PROCEDURE TRAY) ×1 IMPLANT
SET TUBE SMOKE EVAC HIGH FLOW (TUBING) ×1 IMPLANT
SOL PREP POV-IOD 4OZ 10% (MISCELLANEOUS) IMPLANT
SUT MNCRL AB 3-0 PS2 27 (SUTURE) ×1 IMPLANT
SUT VIC AB 4-0 PS2 18 (SUTURE) IMPLANT
SUT VICRYL 0 UR6 27IN ABS (SUTURE) ×1 IMPLANT
SYR 30ML LL (SYRINGE) IMPLANT
SYR 50ML LL SCALE MARK (SYRINGE) ×1 IMPLANT
TOWEL OR 17X26 10 PK STRL BLUE (TOWEL DISPOSABLE) ×1 IMPLANT
TRAY FOLEY W/BAG SLVR 14FR LF (SET/KITS/TRAYS/PACK) ×1 IMPLANT
TROCAR Z-THREAD FIOS 11X100 BL (TROCAR) ×1 IMPLANT
TROCAR Z-THREAD FIOS 5X100MM (TROCAR) ×1 IMPLANT
WARMER LAPAROSCOPE (MISCELLANEOUS) ×1 IMPLANT

## 2022-08-09 NOTE — Transfer of Care (Signed)
Immediate Anesthesia Transfer of Care Note  Patient: Nicole Howard  Procedure(s) Performed: LAPAROSCOPIC BILATERAL SALPINGECTOMY (Bilateral: Abdomen)  Patient Location: PACU  Anesthesia Type:General  Level of Consciousness: drowsy, patient cooperative, and responds to stimulation  Airway & Oxygen Therapy: Patient Spontanous Breathing  Post-op Assessment: Report given to RN and Post -op Vital signs reviewed and stable  Post vital signs: Reviewed and stable  Last Vitals:  Vitals Value Taken Time  BP 127/85 08/09/22 1433  Temp    Pulse 79 08/09/22 1436  Resp 10 08/09/22 1436  SpO2 100 % 08/09/22 1436  Vitals shown include unvalidated device data.  Last Pain:  Vitals:   08/09/22 1112  TempSrc: Oral  PainSc: 0-No pain      Patients Stated Pain Goal: 6 (08/09/22 1112)  Complications: No notable events documented.

## 2022-08-09 NOTE — Anesthesia Postprocedure Evaluation (Signed)
Anesthesia Post Note  Patient: Nicole Howard  Procedure(s) Performed: LAPAROSCOPIC BILATERAL SALPINGECTOMY (Bilateral: Abdomen)     Patient location during evaluation: PACU Anesthesia Type: General Level of consciousness: awake and alert Pain management: pain level controlled Vital Signs Assessment: post-procedure vital signs reviewed and stable Respiratory status: spontaneous breathing, nonlabored ventilation and respiratory function stable Cardiovascular status: blood pressure returned to baseline Postop Assessment: no apparent nausea or vomiting Anesthetic complications: no   No notable events documented.  Last Vitals:  Vitals:   08/09/22 1112 08/09/22 1433  BP: (!) 138/95 127/85  Pulse: 73 83  Resp: 15 16  Temp: 36.7 C 36.5 C  SpO2: 99% 100%    Last Pain:  Vitals:   08/09/22 1433  TempSrc:   PainSc: Asleep                 Shanda Howells

## 2022-08-09 NOTE — H&P (Signed)
Nicole Howard is an 31 y.o. female. Pt known to me presenting for scheduled bilateral salpingectomy for sterilization.  Pertinent Gynecological History: Menses: regular every month without intermenstrual spotting OB History: G4, P3013   Menstrual History: Patient's last menstrual period was 08/01/2022 (exact date).    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   POST D&E FOR ABORTION AT [redacted]WK GESTATION COMPLICATED BY LARGE BLOOD LOSS,  PT RECEIVED BLOOD AND PLASMA TRANSFUSIONS   IDA (iron deficiency anemia)     Past Surgical History:  Procedure Laterality Date   DILATION AND CURETTAGE OF UTERUS  10/14/2020   @[facility];   W/ SUCTION SECONDARY TO  HEMORRHAGE/ DIC,  POST D&E FOR ABORTION [redacted] [facility] GESTATION (AT PLANNED PARENTHOOD)   FOREIGN BODY REMOVAL Left 09/18/2015   Procedure: FOREIGN BODY REMOVAL OF BIRTH CONTROL DEVICE IN LEFT ARM ADULT;  Surgeon: 09/20/2015, MD;  Location: [facility];  Service: General;  Laterality: Left;    Family History  Adopted: Yes  Problem Relation Age of Onset   Other Neg Hx     Social History:  reports that she has never smoked. She has never used smokeless tobacco. She reports that she does not drink alcohol and does not use drugs.  Allergies:  Allergies  Allergen Reactions   Tramadol Nausea And Vomiting    Medications Prior to Admission  Medication Sig Dispense Refill Last Dose   acetaminophen (TYLENOL) 325 MG tablet Take 2 tablets (650 mg total) by mouth every 4 (four) hours as needed (for pain scale < 4).   Past Month   cholecalciferol (VITAMIN D3) 25 MCG (1000 UNIT) tablet Take 1,000 Units by mouth once a week.   08/02/2022   ferrous sulfate 325 (65 FE) MG tablet Take 325 mg by mouth daily with breakfast. (Patient not taking: Reported on 08/07/2022)   Not Taking    Review of Systems  Blood pressure (!) 138/95, pulse 73, temperature 98 F (36.7 C), temperature source Oral,  resp. rate 15, height 5\' 5"  (1.651 m), weight 60.3 kg, last menstrual period 08/01/2022, SpO2 99 %, not currently breastfeeding. Physical Exam Resp unlabored CV RRR Abdomen soft, NT Extremities no calf tenderness  Results for orders placed or performed during the hospital encounter of 08/09/22 (from the past 24 hour(s))  Pregnancy, urine POC     Status: None   Collection Time: 08/09/22 10:55 AM  Result Value Ref Range   Preg Test, Ur NEGATIVE NEGATIVE    No results found.  Assessment/Plan: 31yo 08/11/22 presenting for scheduled laparoscopic bilateral salpingectomy for sterilization.  Risks benefits alternatives reviewed including but not limited to bleeding infection injury, questions answered and consent signed and witnessed.  08/11/22 08/09/2022, 1:15 PM

## 2022-08-09 NOTE — Anesthesia Procedure Notes (Signed)
Procedure Name: Intubation Date/Time: 08/09/2022 1:34 PM  Performed by: Rogers Blocker, CRNAPre-anesthesia Checklist: Patient identified, Emergency Drugs available, Suction available and Patient being monitored Patient Re-evaluated:Patient Re-evaluated prior to induction Oxygen Delivery Method: Circle System Utilized Preoxygenation: Pre-oxygenation with 100% oxygen Induction Type: IV induction Ventilation: Mask ventilation without difficulty Laryngoscope Size: Mac and 3 Grade View: Grade I Tube type: Oral Number of attempts: 1 Airway Equipment and Method: Stylet and Oral airway Placement Confirmation: ETT inserted through vocal cords under direct vision, positive ETCO2 and breath sounds checked- equal and bilateral Secured at: 22 cm Tube secured with: Tape Dental Injury: Teeth and Oropharynx as per pre-operative assessment

## 2022-08-09 NOTE — Discharge Instructions (Addendum)
DO NOT TAKE Motrin UNTIL 8pm tonight  DISCHARGE INSTRUCTIONS: Laparoscopy  The following instructions have been prepared to help you care for yourself upon your return home today.  Wound care:  Do not get the incision wet for the first 24 hours. The incision should be kept clean and dry.  The Band-Aids or dressings may be removed the day after surgery.  Should the incision become sore, red, and swollen after the first week, check with your doctor.  Personal hygiene:  Shower the day after your procedure.  Activity and limitations:  Do NOT drive or operate any equipment today.  Do NOT lift anything more than 15 pounds for 2-3 weeks after surgery.  Do NOT rest in bed all day.  Walking is encouraged. Walk each day, starting slowly with 5-minute walks 3 or 4 times a day. Slowly increase the length of your walks.  Walk up and down stairs slowly.  Do NOT do strenuous activities, such as golfing, playing tennis, bowling, running, biking, weight lifting, gardening, mowing, or vacuuming for 2-4 weeks. Ask your doctor when it is okay to start.  Diet: Eat a light meal as desired this evening. You may resume your usual diet tomorrow.  Return to work: This is dependent on the type of work you do. For the most part you can return to a desk job within a week of surgery. If you are more active at work, please discuss this with your doctor.  What to expect after your surgery: You may have a slight burning sensation when you urinate on the first day. You may have a very small amount of blood in the urine. Expect to have a small amount of vaginal discharge/light bleeding for 1-2 weeks. It is not unusual to have abdominal soreness and bruising for up to 2 weeks. You may be tired and need more rest for about 1 week. You may experience shoulder pain for 24-72 hours. Lying flat in bed may relieve it.  Call your doctor for any of the following:  Develop a fever of 100.4 or greater  Inability to urinate 6 hours  after discharge from hospital  Severe pain not relieved by pain medications  Persistent of heavy bleeding at incision site  Redness or swelling around incision site after a week  Increasing nausea or vomiting  Post Anesthesia Home Care Instructions  Activity: Get plenty of rest for the remainder of the day. A responsible adult should stay with you for 24 hours following the procedure.  For the next 24 hours, DO NOT: -Drive a car -Advertising copywriter -Drink alcoholic beverages -Take any medication unless instructed by your physician -Make any legal decisions or sign important papers.  Meals: Start with liquid foods such as gelatin or soup. Progress to regular foods as tolerated. Avoid greasy, spicy, heavy foods. If nausea and/or vomiting occur, drink only clear liquids until the nausea and/or vomiting subsides. Call your physician if vomiting continues.  Special Instructions/Symptoms: Your throat may feel dry or sore from the anesthesia or the breathing tube placed in your throat during surgery. If this causes discomfort, gargle with warm salt water. The discomfort should disappear within 24 hours.  If you had a scopolamine patch placed behind your ear for the management of post- operative nausea and/or vomiting:  1. The medication in the patch is effective for 72 hours, after which it should be removed.  Wrap patch in a tissue and discard in the trash. Wash hands thoroughly with soap and water. 2. You  may remove the patch earlier than 72 hours if you experience unpleasant side effects which may include dry mouth, dizziness or visual disturbances. 3. Avoid touching the patch. Wash your hands with soap and water after contact with the patch.

## 2022-08-09 NOTE — Anesthesia Preprocedure Evaluation (Signed)
Anesthesia Evaluation  Patient identified by MRN, date of birth, ID band Patient awake    Reviewed: Allergy & Precautions, NPO status , Patient's Chart, lab work & pertinent test results  Airway Mallampati: II  TM Distance: >3 FB Neck ROM: Full    Dental no notable dental hx. (+) Teeth Intact   Pulmonary neg pulmonary ROS   Pulmonary exam normal breath sounds clear to auscultation       Cardiovascular negative cardio ROS Normal cardiovascular exam Rhythm:Regular Rate:Normal     Neuro/Psych  Headaches  negative psych ROS   GI/Hepatic negative GI ROS, Neg liver ROS,,,  Endo/Other  negative endocrine ROS    Renal/GU negative Renal ROS  negative genitourinary   Musculoskeletal negative musculoskeletal ROS (+)    Abdominal   Peds  Hematology  (+) Blood dyscrasia, anemia Hx/o DIC S/P 15 week TOP developed post op hemorrhage requiring blood and products transfused   Anesthesia Other Findings   Reproductive/Obstetrics Desires sterilization                              Anesthesia Physical Anesthesia Plan  ASA: 2  Anesthesia Plan: General   Post-op Pain Management: Tylenol PO (pre-op)* and Precedex   Induction: Intravenous  PONV Risk Score and Plan: 4 or greater and Treatment may vary due to age or medical condition, Midazolam, Scopolamine patch - Pre-op, Ondansetron and Dexamethasone  Airway Management Planned: Oral ETT  Additional Equipment: None  Intra-op Plan:   Post-operative Plan: Extubation in OR  Informed Consent: I have reviewed the patients History and Physical, chart, labs and discussed the procedure including the risks, benefits and alternatives for the proposed anesthesia with the patient or authorized representative who has indicated his/her understanding and acceptance.     Dental advisory given  Plan Discussed with: Anesthesiologist and CRNA  Anesthesia Plan  Comments:          Anesthesia Quick Evaluation

## 2022-08-09 NOTE — Op Note (Signed)
Preop Diagnosis: Desires Surgical Sterilzation  Postop Diagnosis: Desires Surgical Sterilzation  Procedure: LAPAROSCOPIC BILATERAL SALPINGECTOMY  Anesthesia: General   Attending: Purcell Nails, MD   Assistant:  Dr. Reesa Chew  Findings: Normal appearing bilateral ovaries and tubes  Pathology: Bilateral tubes  Fluids: 800 cc  UOP: 50 cc  EBL: 5 cc  Complications: None  Procedure: The patient was taken to the operating room after the risks, benefits, alternatives, complications, treatment options, and expected outcomes were discussed with the patient. The patient verbalized understanding, the patient concurred with the proposed plan and consent signed and witnessed. The patient was taken to the Operating Room, identified as Laparoscopic Bilateral Salpingectomy  and procedure verified as sterilization procedure via laparoscopic bilateral salpingectomy. A Time Out was held and the above information confirmed.  The patient was placed under general anesthesia per anesthesia staff, the patient was placed in modified dorsal lithotomy position and was prepped, draped, and catheterized in the normal, sterile fashion.  The cervix was visualized and an intrauterine manipulator was placed. A  10 mm umbilical incision was then performed. Veress needle was passed and pneumoperitoneum was established. A 10 mm trocar was advanced into the intraabdominal cavity, the laparoscope was introduced and findings as noted above.  A 59mm incision was made suprapubically and 50mm trocar advanced into the intraabdominal cavity under direct visualization.  The same was done in the RLQ.  The right fallopian tube was identified and carried out to its fimbriated end and excised with the Ligasure.  The same was done on the contralateral side.  The laparoscope was removed and pneumoperitoneum released.  The fascia was repaired with an interrupted stitch of 0 vicryl and the skin was reapproximated with 3-0  vicryl via subcuticular stitch.  Dermabond was applied to close the 64mm incisions and used to reinforce the 36mm umbilical incision.  Sponge, lap and needle count were correct.  Patient tolerated the procedure well and was returned to the recovery room in good condition.

## 2022-08-13 LAB — SURGICAL PATHOLOGY

## 2022-08-14 ENCOUNTER — Encounter (HOSPITAL_BASED_OUTPATIENT_CLINIC_OR_DEPARTMENT_OTHER): Payer: Self-pay | Admitting: Obstetrics and Gynecology

## 2022-10-17 IMAGING — US US PELVIS COMPLETE TRANSABD/TRANSVAG W DUPLEX
1 series · 13 of 25 positions shown · non-contrast
Comparison: Pelvic CT 01/04/2017

CLINICAL DATA: 29-year-old with pelvic pain.

EXAM:
TRANSABDOMINAL AND TRANSVAGINAL ULTRASOUND OF PELVIS
DOPPLER ULTRASOUND OF OVARIES
TECHNIQUE: Both transabdominal and transvaginal ultrasound examinations of the
pelvis were performed. Transabdominal technique was performed for
global imaging of the pelvis including uterus, ovaries, adnexal
regions, and pelvic cul-de-sac.
It was necessary to proceed with endovaginal exam following the
transabdominal exam to visualize the uterus and ovaries. Color and
duplex Doppler ultrasound was utilized to evaluate blood flow to the
ovaries.

[Series 1: gyn us · 13 of 125 slices shown]
[im 1/125]
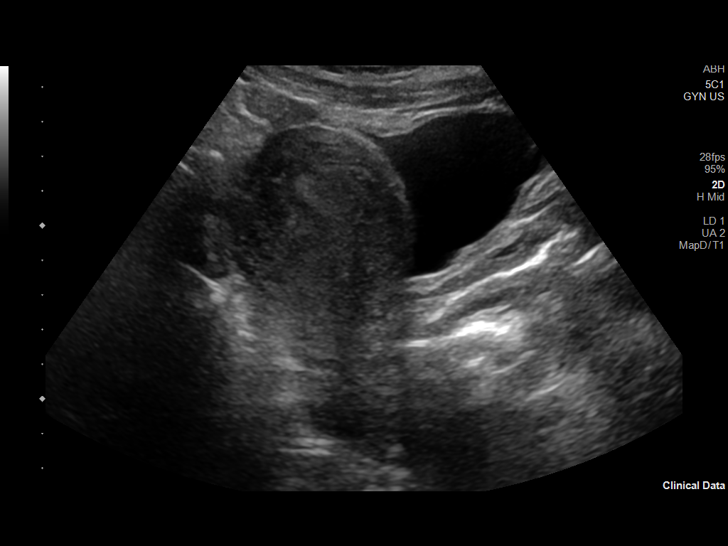
[im 11/125]
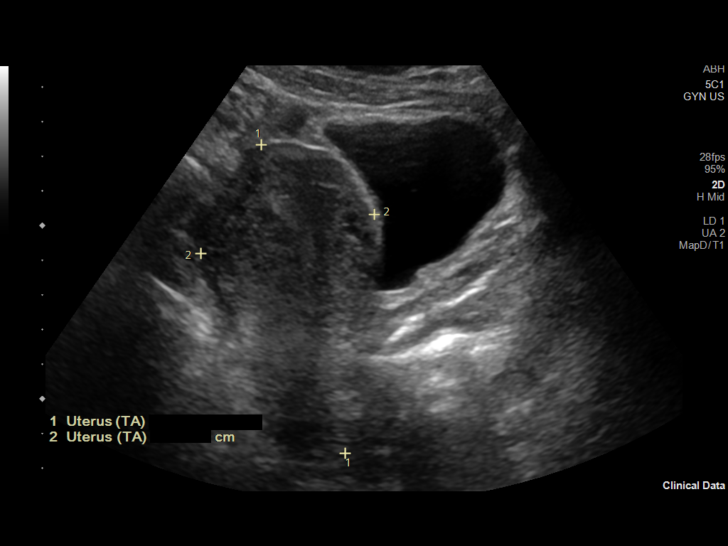
[im 21/125]
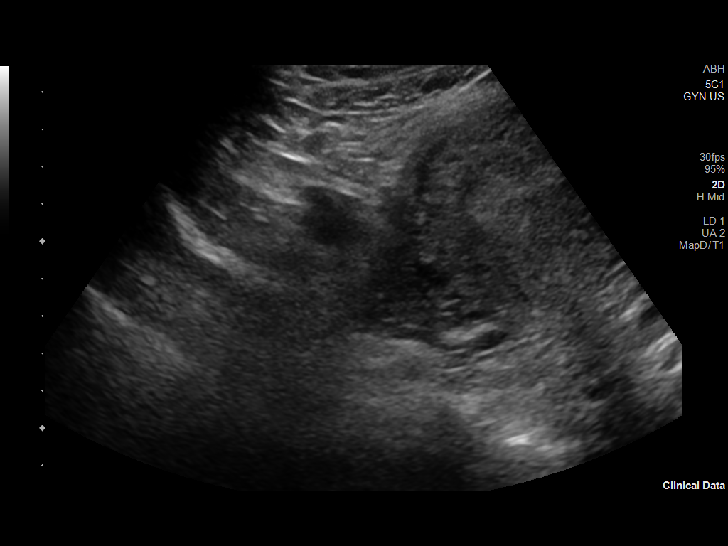
[im 32/125]
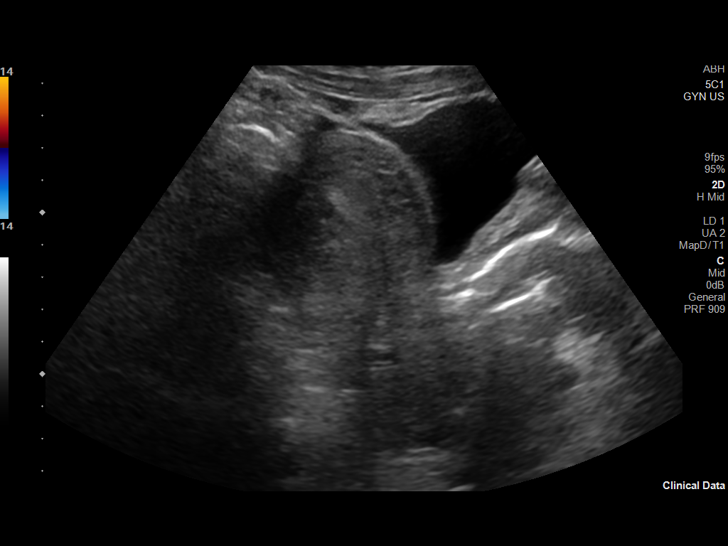
[im 42/125]
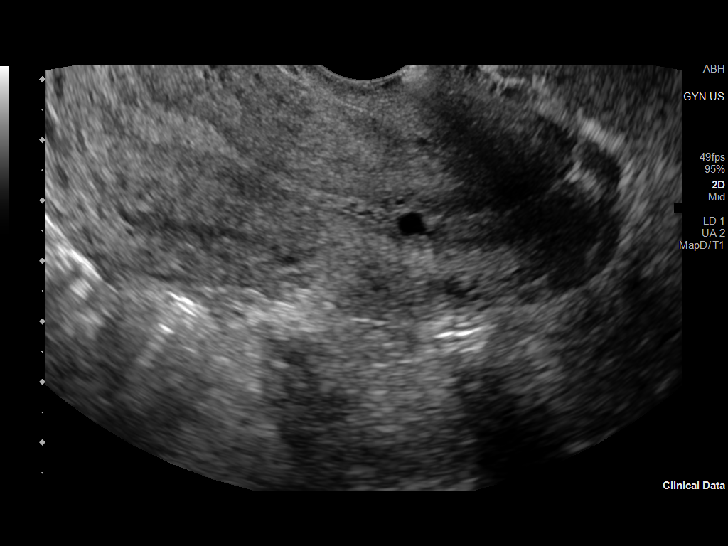
[im 52/125]
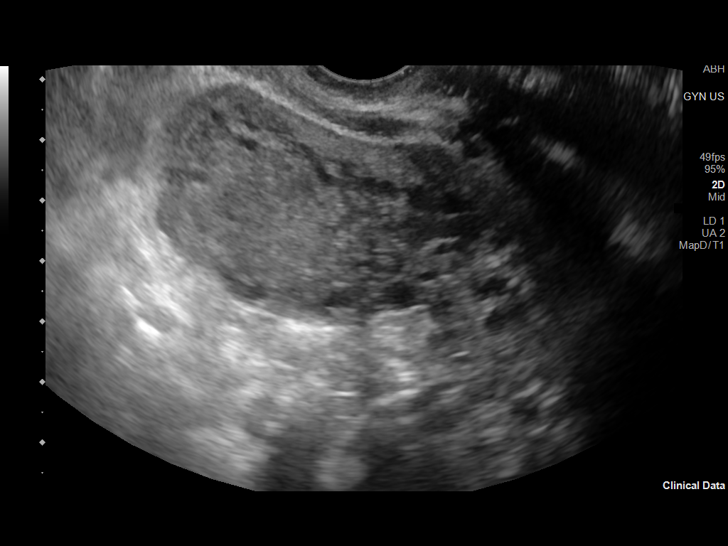
[im 63/125]
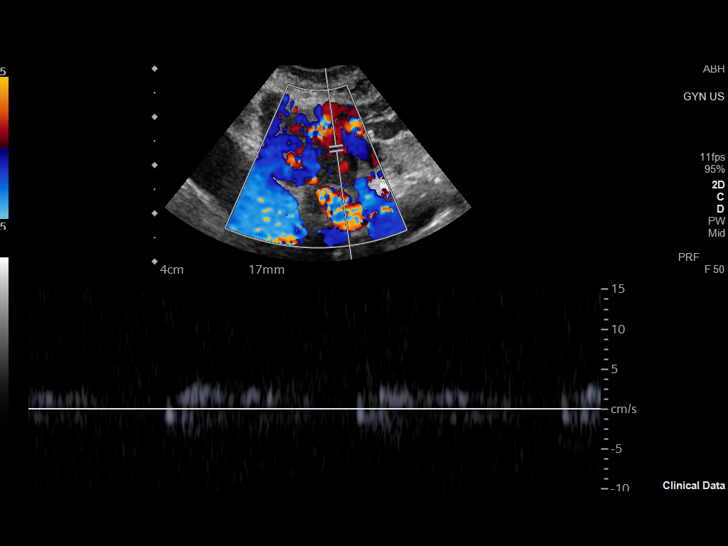
[im 73/125]
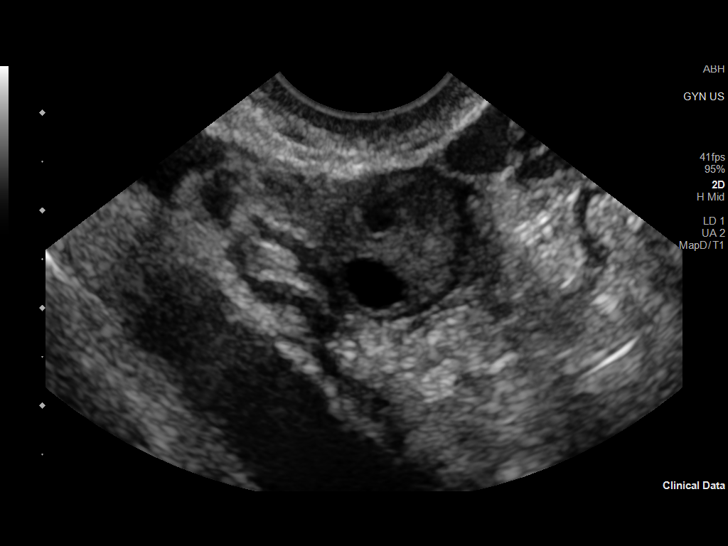
[im 83/125]
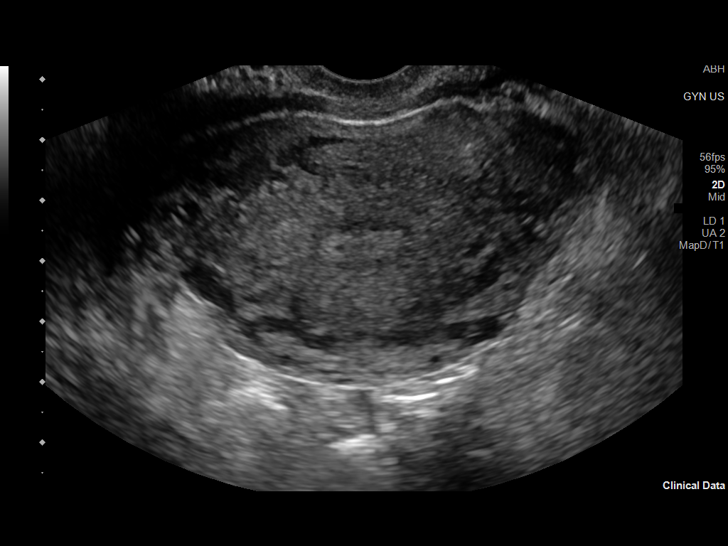
[im 94/125]
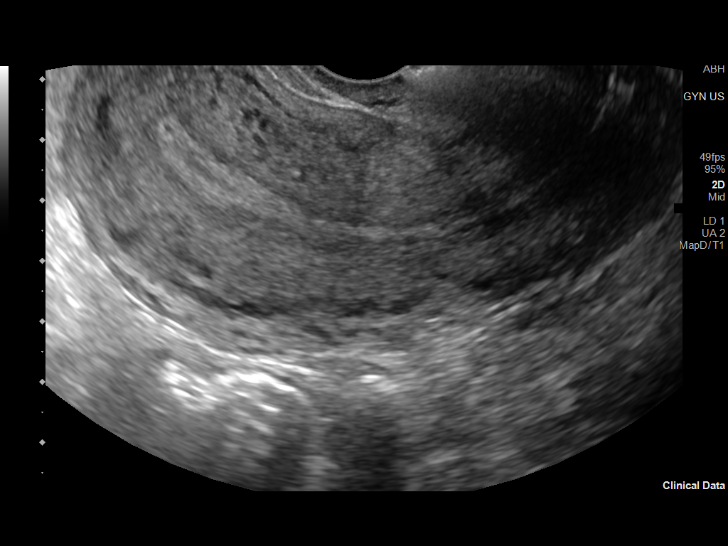
[im 104/125]
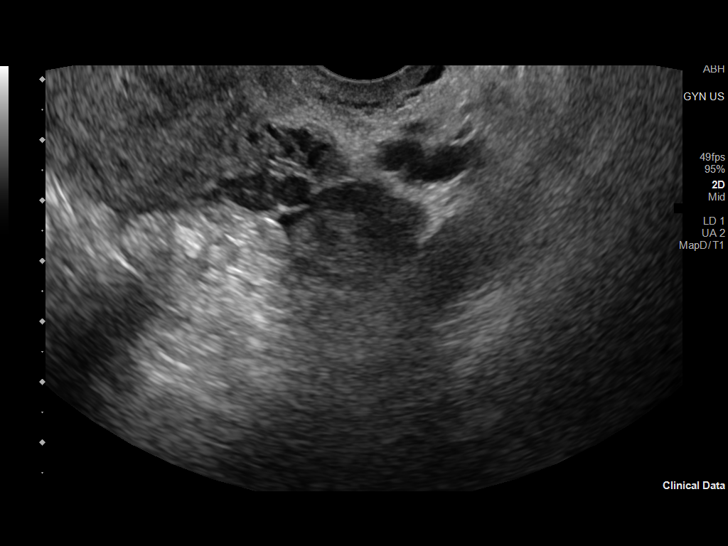
[im 114/125]
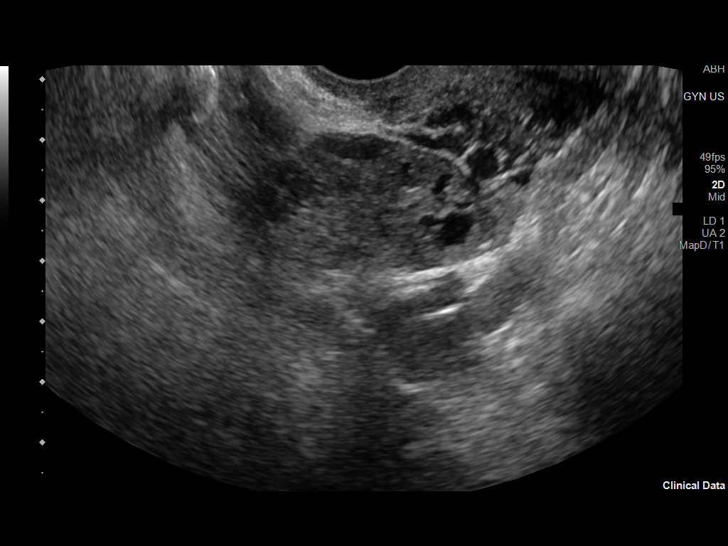
[im 125/125]
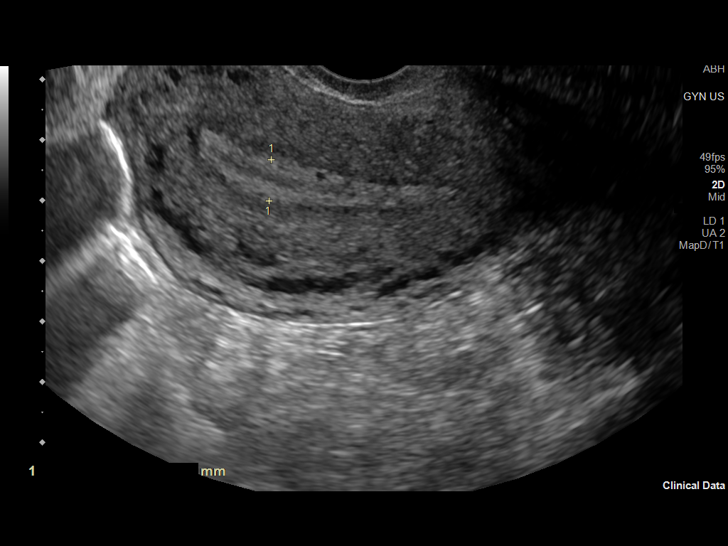

[13 of 25 positions shown; findings below may reference images not displayed]

FINDINGS: Uterus

Measurements: 9.9 x 4.8 x 6.6 cm = volume: 164 mL. No fibroids or
other mass visualized.

Endometrium

Thickness: 0.7 cm.  No focal abnormality visualized.

Right ovary

Measurements: 2.2 x 1.6 x 1.6 cm = volume: 2.9 mL. Normal
appearance/no adnexal mass.

Left ovary

Measurements: 3.7 x 2.2 x 2.3 cm = volume: 9.8 mL. Normal
appearance/no adnexal mass.

Pulsed Doppler evaluation of both ovaries demonstrates normal
low-resistance arterial and venous waveforms.

Other findings

No abnormal free fluid.
IMPRESSION: Normal pelvic ultrasound.

## 2023-05-06 ENCOUNTER — Emergency Department (HOSPITAL_COMMUNITY)
Admission: EM | Admit: 2023-05-06 | Discharge: 2023-05-06 | Discharge status: Against Medical Advice | Payer: Medicaid Other | Attending: Emergency Medicine | Admitting: Emergency Medicine

## 2023-05-06 ENCOUNTER — Encounter (HOSPITAL_COMMUNITY): Payer: Self-pay | Admitting: Emergency Medicine

## 2023-05-06 DIAGNOSIS — Z5321 Procedure and treatment not carried out due to patient leaving prior to being seen by health care provider: Secondary | ICD-10-CM | POA: Insufficient documentation

## 2023-05-06 DIAGNOSIS — U071 COVID-19: Secondary | ICD-10-CM | POA: Diagnosis not present

## 2023-05-06 DIAGNOSIS — R059 Cough, unspecified: Secondary | ICD-10-CM | POA: Diagnosis present

## 2023-05-06 LAB — RESP PANEL BY RT-PCR (RSV, FLU A&B, COVID)  RVPGX2
Influenza A by PCR: NEGATIVE
Influenza B by PCR: NEGATIVE
Resp Syncytial Virus by PCR: NEGATIVE
SARS Coronavirus 2 by RT PCR: POSITIVE — AB

## 2023-05-06 MED ORDER — ACETAMINOPHEN 325 MG PO TABS: 650.0000 mg | ORAL_TABLET | Freq: Four times a day (QID) | ORAL | Status: DC | PRN

## 2023-05-06 NOTE — ED Triage Notes (Signed)
Pt   here  from home with c/o congestion and cough along with body aches and h/a

## 2023-05-06 NOTE — ED Notes (Signed)
Pt left after seeing her + covid results

## 2023-06-05 IMAGING — MR MR ABDOMEN W/O CM
16 of 19 series · 35 of 48 positions shown · non-contrast
Comparison: None.

Pelvic ultrasound, same date.

CLINICAL DATA: Five weeks pregnant with lower abdominal pain.

EXAM:
MRI ABDOMEN AND PELVIS WITHOUT CONTRAST
TECHNIQUE: Multiplanar multisequence MR imaging of the abdomen and pelvis was
performed. No intravenous contrast was administered.

[Series 4: cor haste · coronal · 5.0mm · 1.07mm/px · 3 of 34 slices shown]
[im 1/34]
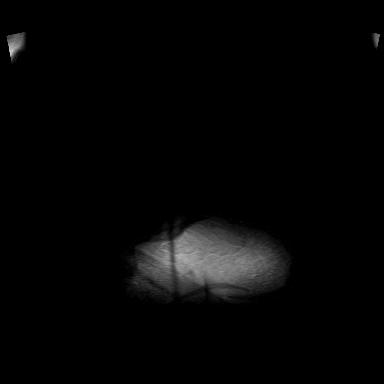
[im 17/34]
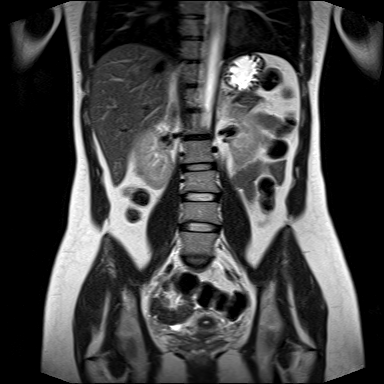
[im 34/34]
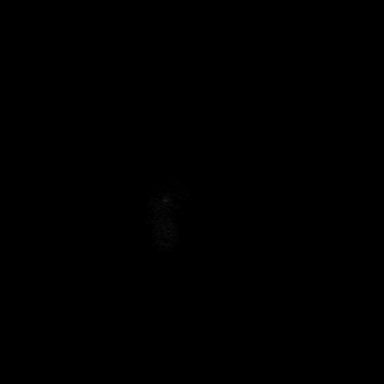

[Series 6: cor haste fs · coronal · 5.0mm · 1.07mm/px · 3 of 33 slices shown]
[im 1/33]
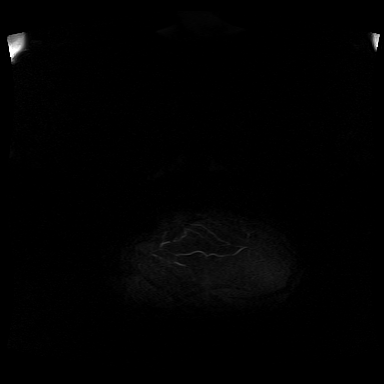
[im 17/33]
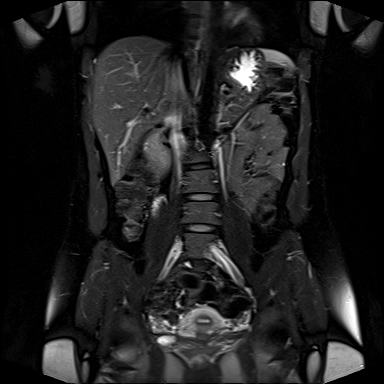
[im 33/33]
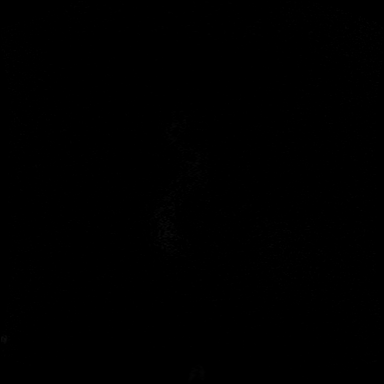

[Series 8: bSSFP · coronal · 5.0mm · 1.83mm/px · 2 of 34 slices shown (1 of 5)]
[im 1/34]
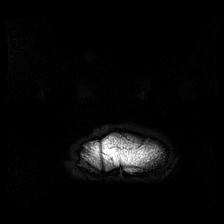
[im 34/34]
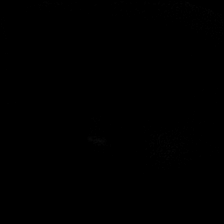

[Series 10: ax haste · axial · 5.0mm · 0.99mm/px · z∈[-106,+68]mm · 2 of 30 slices shown (1 of 2)]
[im 1/30]
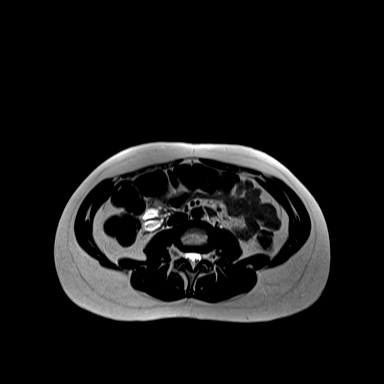
[im 30/30]
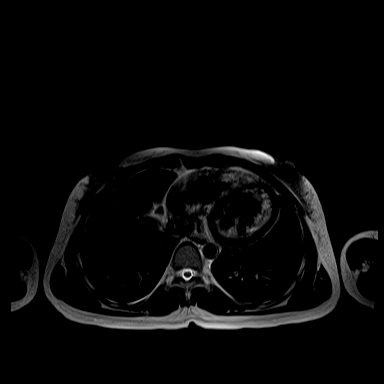

[Series 12: ax haste · axial · 5.0mm · 1.19mm/px · z∈[-298,-100]mm · 2 of 34 slices shown (2 of 2)]
[im 1/34]
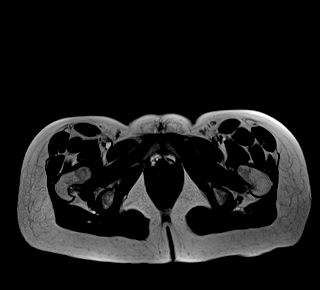
[im 34/34]
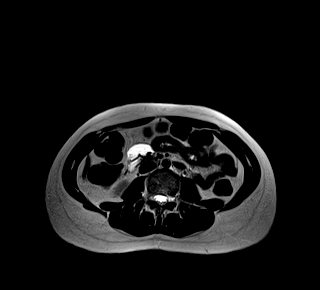

[Series 13: ax haste_comp · axial · 5.0mm · 1.19mm/px · z∈[-298,-106]mm · 2 of 33 slices shown (1 of 2)]
[im 1/33]
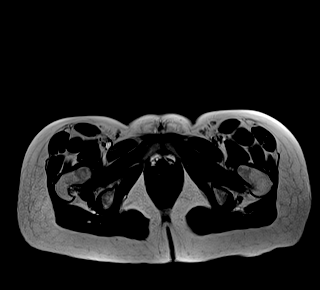
[im 33/33]
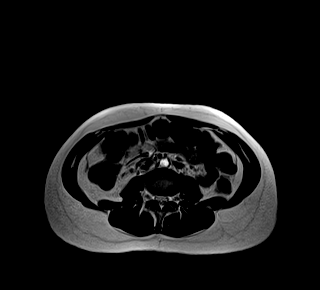

[Series 13: ax haste_comp · axial · 5.0mm · 0.99mm/px · z∈[-100,+68]mm · 2 of 29 slices shown (2 of 2)]
[im 1/29]
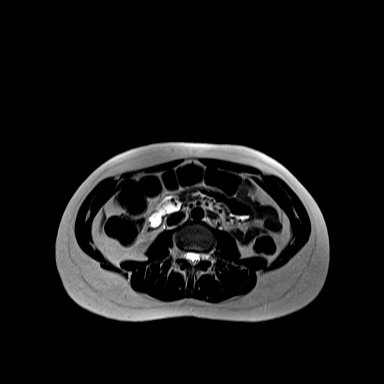
[im 29/29]
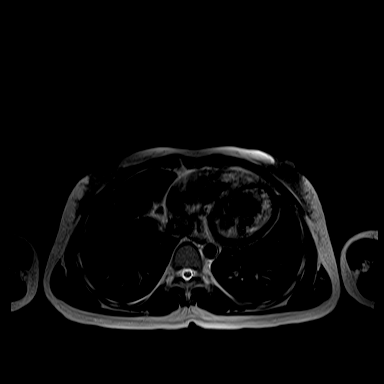

[Series 15: ax haste fs · axial · 5.0mm · 0.99mm/px · z∈[-108,+78]mm · 2 of 32 slices shown (1 of 2)]
[im 1/32]
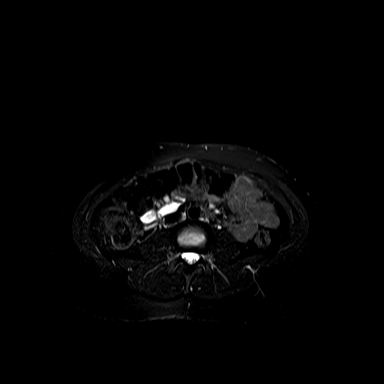
[im 32/32]
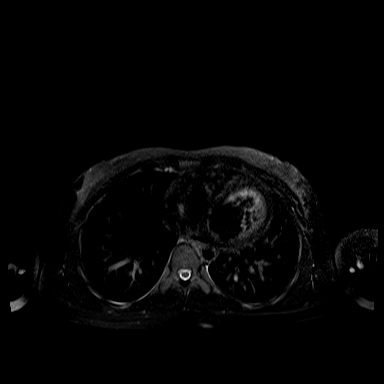

[Series 17: ax haste fs · axial · 5.0mm · 1.19mm/px · z∈[-309,-99]mm · 2 of 36 slices shown (2 of 2)]
[im 1/36]
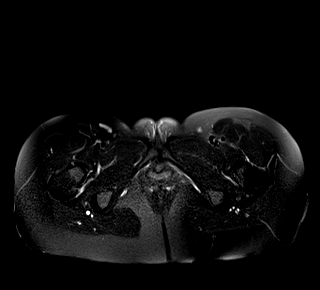
[im 36/36]
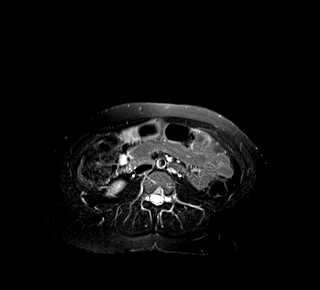

[Series 18: ax haste fs_comp · axial · 5.0mm · 1.19mm/px · z∈[-309,-105]mm · 2 of 35 slices shown (1 of 2)]
[im 1/35]
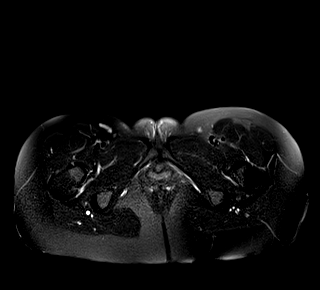
[im 35/35]
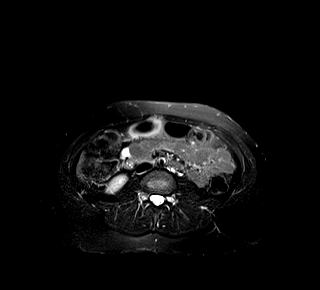

[Series 18: ax haste fs_comp · axial · 5.0mm · 0.99mm/px · z∈[-102,+78]mm · 2 of 31 slices shown (2 of 2)]
[im 1/31]
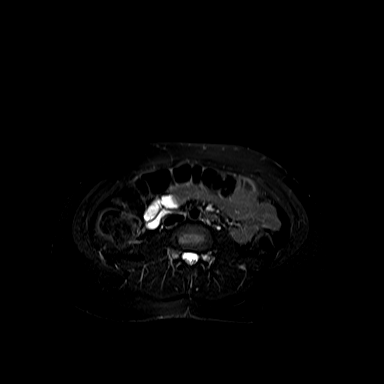
[im 31/31]
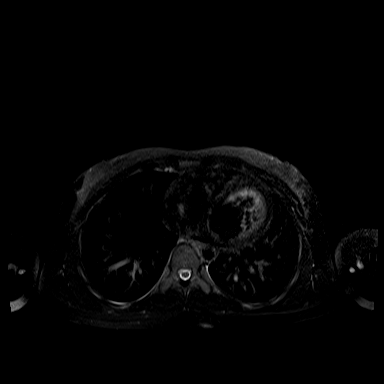

[Series 19: bSSFP · axial · 5.0mm · 1.61mm/px · z∈[-103,+83]mm · 2 of 32 slices shown (2 of 5)]
[im 1/32]
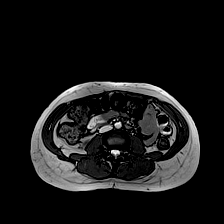
[im 32/32]
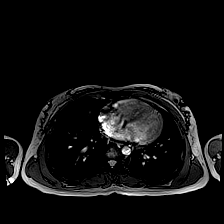

[Series 20: bSSFP · axial · 5.0mm · 0.74mm/px · z∈[-306,-96]mm · 2 of 36 slices shown (3 of 5)]
[im 1/36]
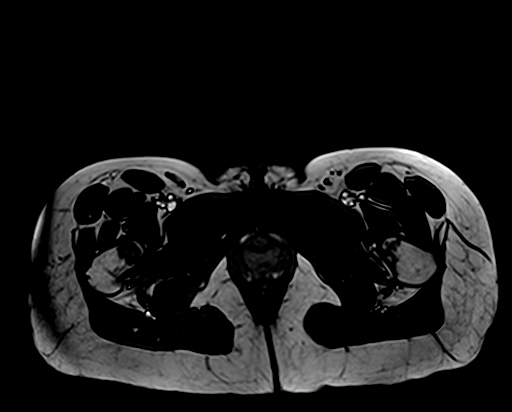
[im 36/36]
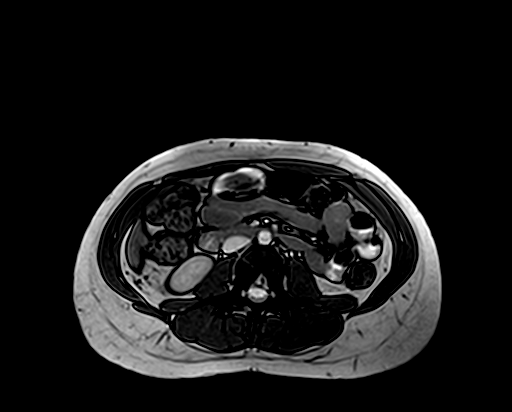

[Series 21: bSSFP · axial · 5.0mm · 1.61mm/px · z∈[-97,+83]mm · 2 of 31 slices shown (4 of 5)]
[im 1/31]
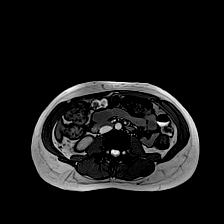
[im 31/31]
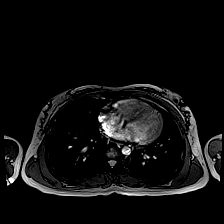

[Series 21: bSSFP · axial · 5.0mm · 0.74mm/px · z∈[-306,-102]mm · 2 of 35 slices shown (5 of 5)]
[im 1/35]
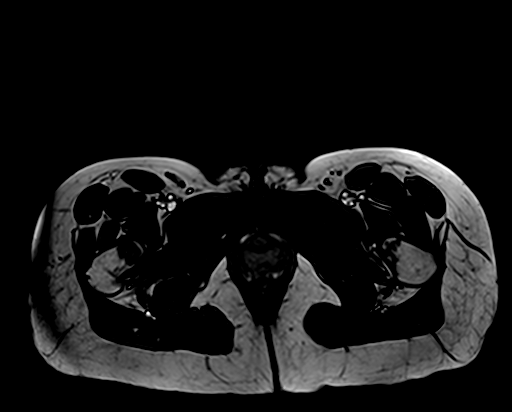
[im 35/35]
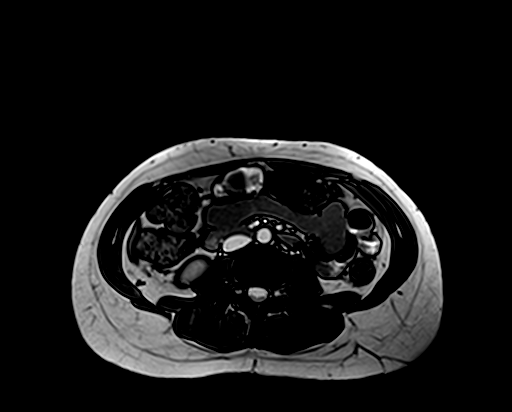

[Series 22: T1 · axial · 3.0mm · 0.70mm/px · z∈[-121,+5]mm · 3 of 54 slices shown]
[im 1/54]
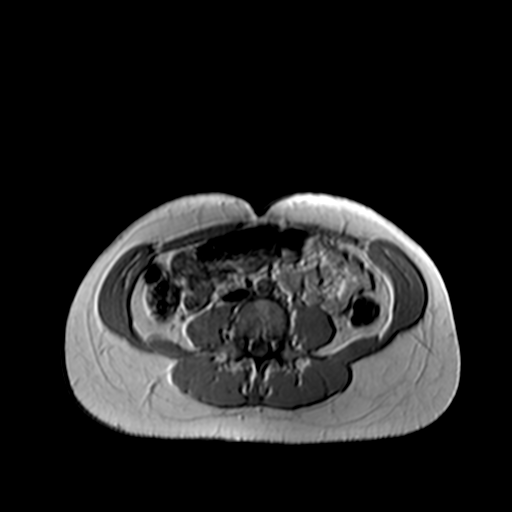
[im 18/54]
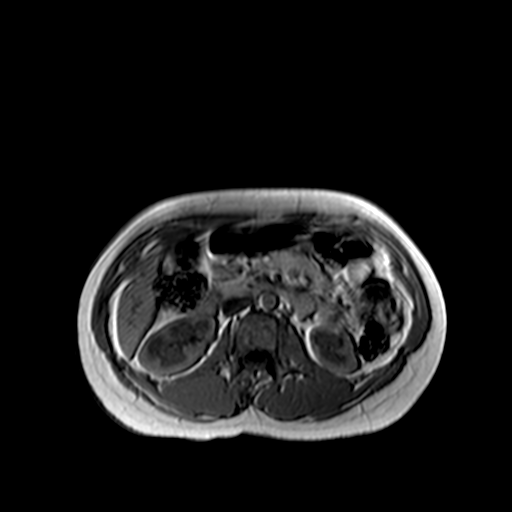
[im 36/54]
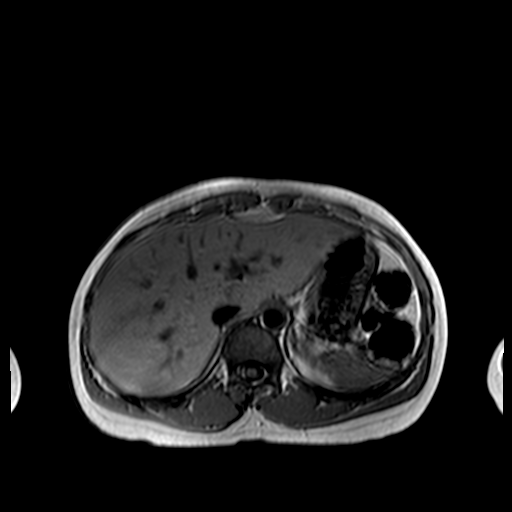

[35 of 48 positions shown; findings below may reference images not displayed]

FINDINGS: COMBINED FINDINGS FOR BOTH MR ABDOMEN AND PELVIS

Lower chest: The lung bases are grossly clear. No pulmonary lesions
or pleural effusion. No pericardial effusion.

Hepatobiliary: No hepatic lesions or intrahepatic biliary
dilatation. The gallbladder is unremarkable. No common bile duct
dilatation.

Pancreas:  No mass, inflammation or ductal dilatation.

Spleen:  Normal size.  No focal lesions.

Adrenals/Urinary Tract: Adrenal glands and kidneys are normal. No
renal lesions or hydronephrosis.

Stomach/Bowel: The stomach, duodenum, small bowel and colon are
grossly normal. Low lying cecum deep in the pelvis. The appendix is
not identified for certain but I do not see any findings suspicious
for acute appendicitis.

Vascular/Lymphatic: The major vascular structures appear normal. No
abdominal or pelvic lymphadenopathy.

Reproductive: Gravid uterus. Intrauterine gestational sac noted at
the fundal area. There is a corpus luteum cyst noted on the left.
The right ovary is unremarkable.

Other: No ascites or abdominal wall hernia.

Musculoskeletal: No significant findings.
IMPRESSION: No MR findings to suggest acute appendicitis.

Gravid uterus.

## 2023-06-05 IMAGING — US US OB TRANSVAGINAL
1 series · 15 of 28 positions shown · non-contrast
Comparison: Obstetric ultrasound 08/25/2021

CLINICAL DATA: Pain

EXAM:
TRANSVAGINAL OB ULTRASOUND
TECHNIQUE: Transvaginal ultrasound was performed for complete evaluation of the
gestation as well as the maternal uterus, adnexal regions, and
pelvic cul-de-sac.

[Series 1: us ob transvaginal · 40 acquisitions, 15 frames shown]
[im 1/40]
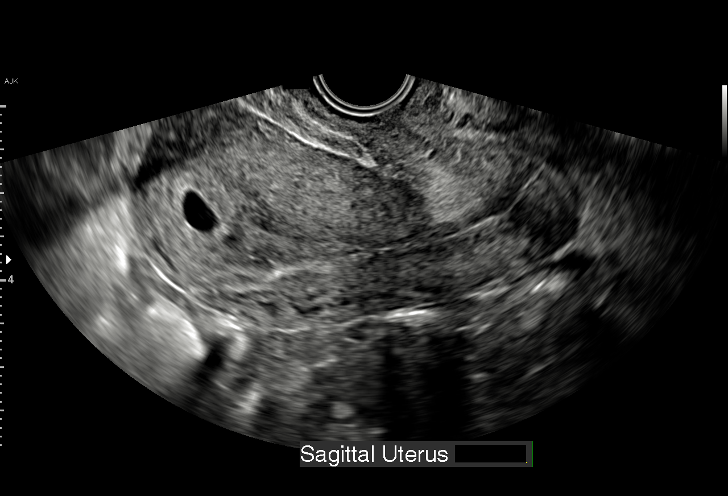
[im 3/40]
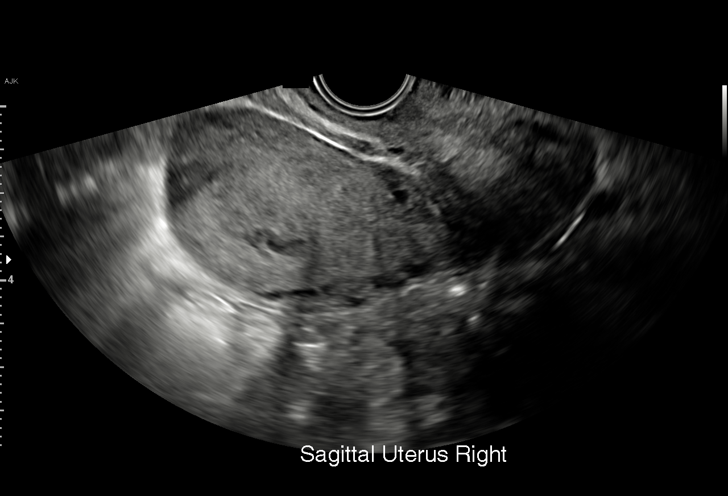
[im 6/40]
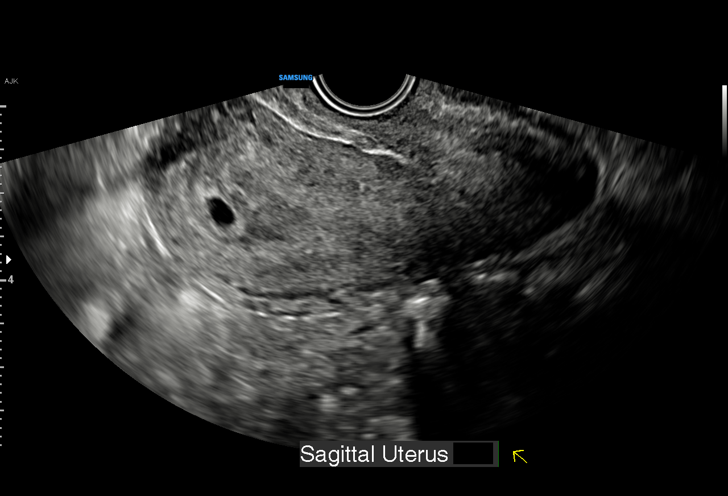
[im 9/40]
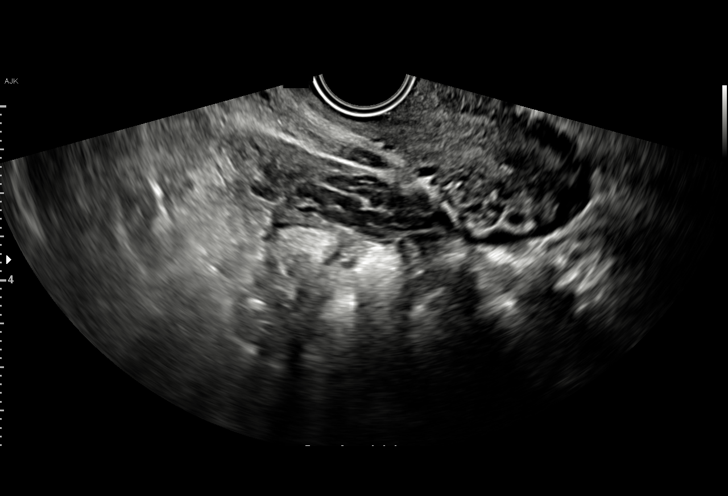
[im 12/40]
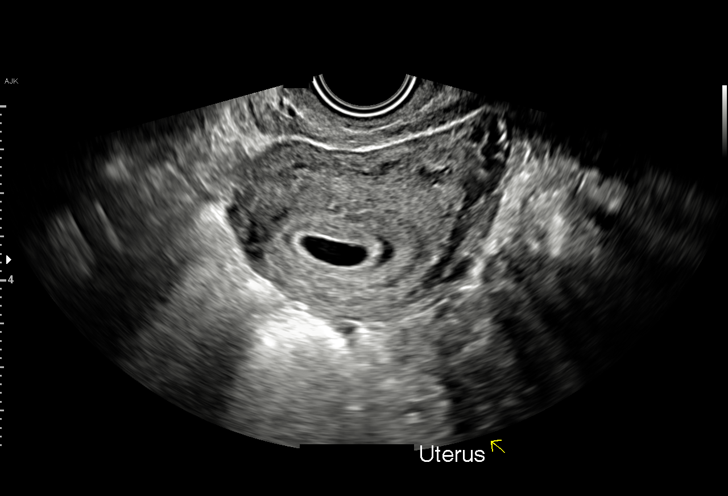
[im 15/40]
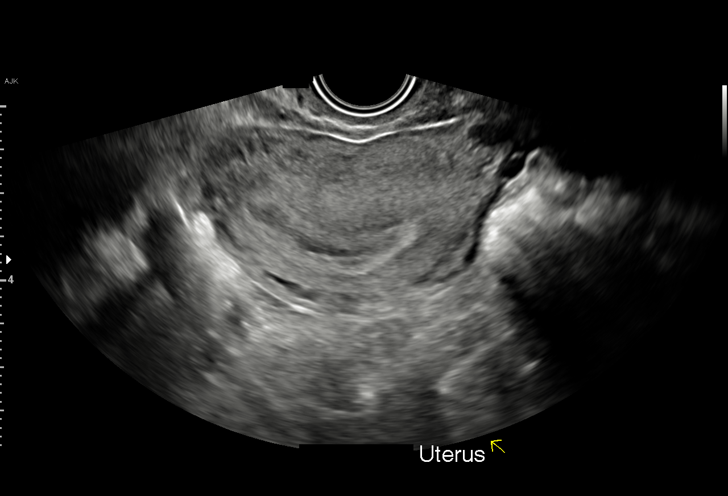
[im 18/40]
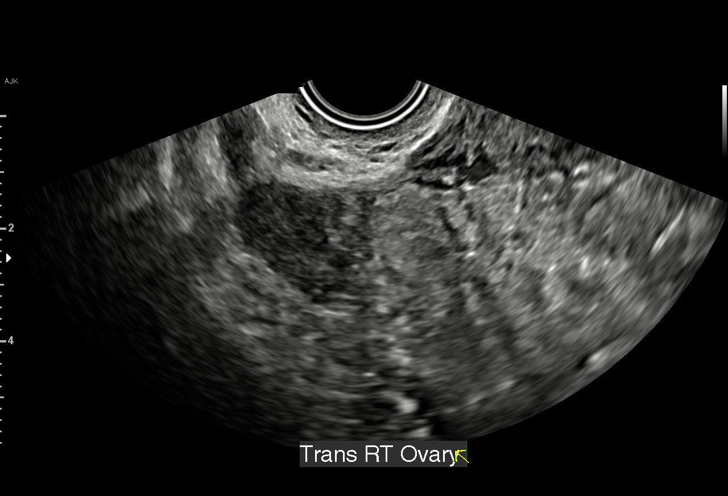
[im 21/40]
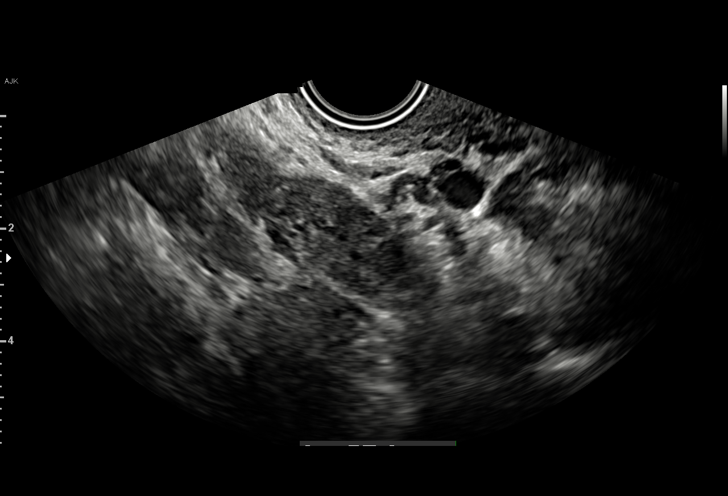
[im 22/40]
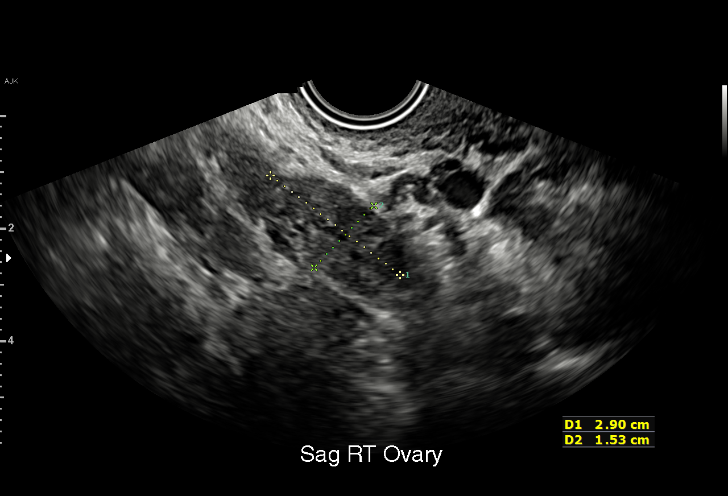
[im 25/40]
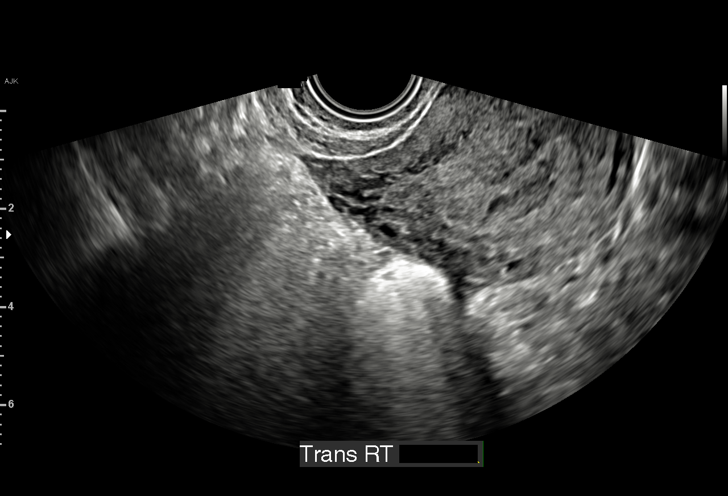
[im 28/40]
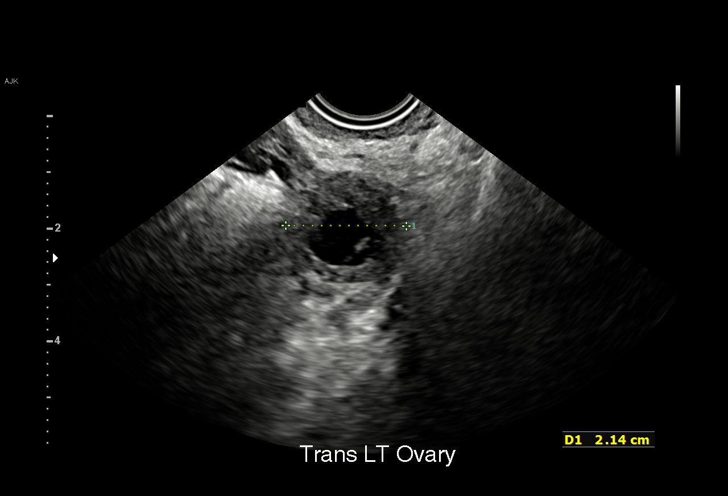
[im 31/40]
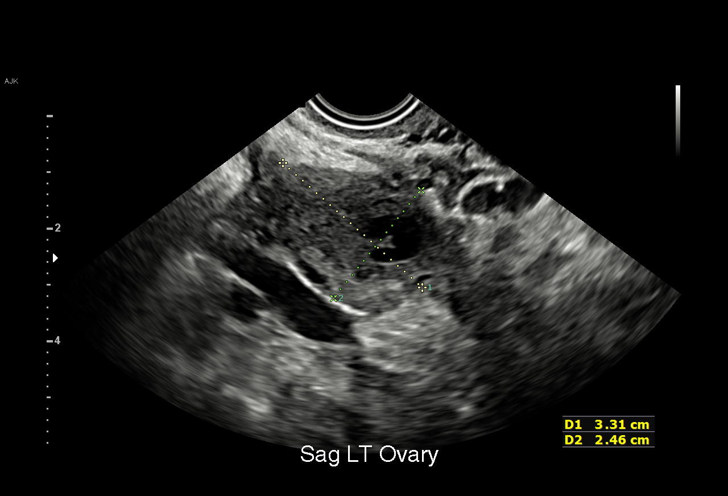
[im 34/40]
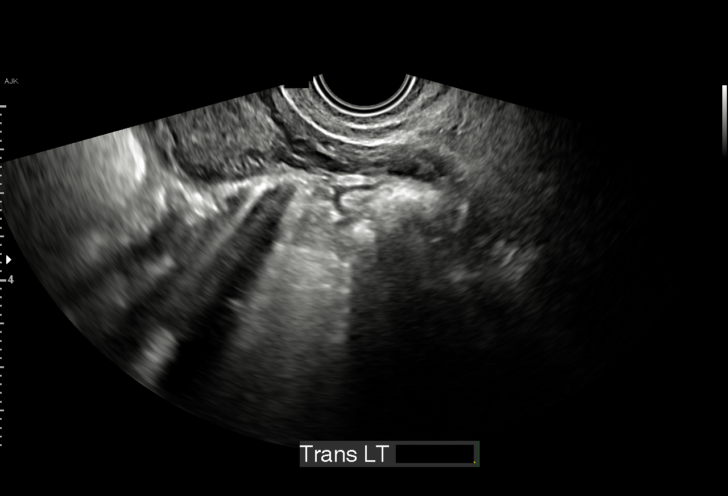
[im 37/40]
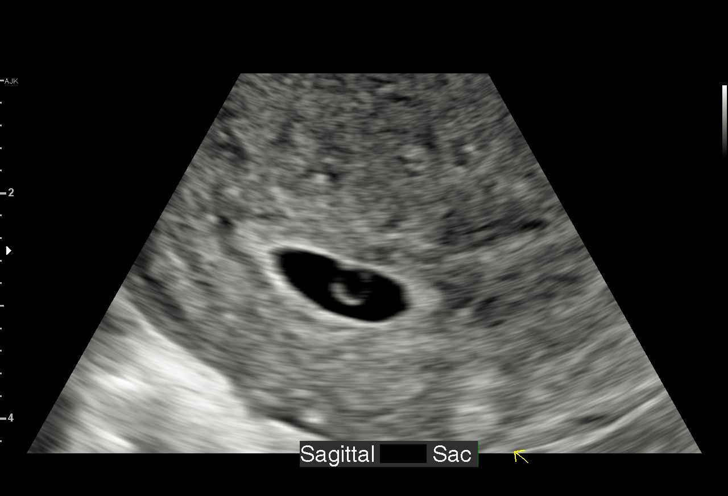
[im 40/40]
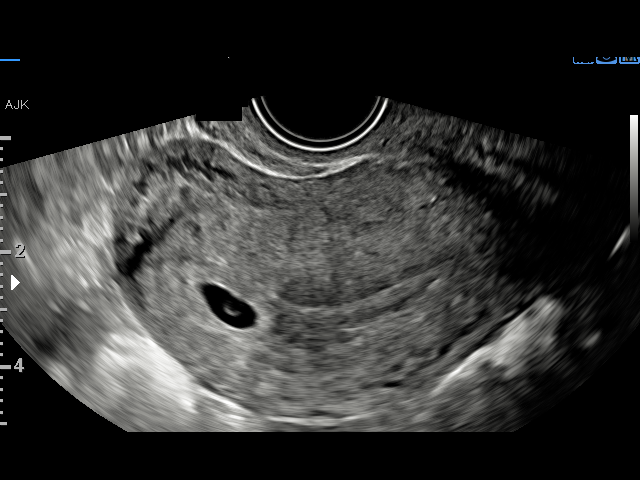

[15 of 28 positions shown; findings below may reference images not displayed]

FINDINGS: Intrauterine gestational sac: Single

Yolk sac:  Visualized.

Embryo:  Not Visualized.

Cardiac Activity: Not Visualized.

Heart Rate: N/a bpm

MSD: 11.1 mm   5 w   6 d

Subchorionic hemorrhage:  None visualized.

Maternal uterus/adnexae: The right ovary is normal measuring 2.6 cm
x 2.9 cm x 1.5 cm. The left ovary measures 2.1 cm x 3.3 cm x 2.5 cm.
A corpus luteum is noted on the left.
IMPRESSION: Early intrauterine gestational sac with yolk sac with an estimated
gestational age of 5 weeks 6 days by mean sac diameter, but no fetal
pole or cardiac activity yet visualized. Recommend follow-up
quantitative B-HCG levels and follow-up US in 14 days to assess
viability. This recommendation follows SRU consensus guidelines:
Diagnostic Criteria for Nonviable Pregnancy Early in the First
Trimester. N Engl J Med 2425; [DATE].

## 2023-06-13 IMAGING — US US OB TRANSVAGINAL
1 series · 15 of 28 positions shown · non-contrast
Comparison: 08/29/2021

CLINICAL DATA: Viability, abdominal cramping

EXAM:
OBSTETRIC <14 WK ULTRASOUND
TECHNIQUE: Transabdominal ultrasound was performed for evaluation of the
gestation as well as the maternal uterus and adnexal regions.

[Series 1: us ob transvaginal · 15 of 80 slices shown]
[im 1/80]
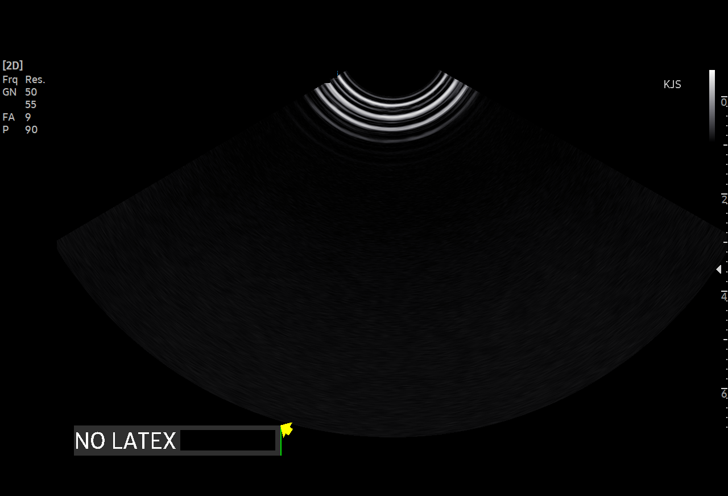
[im 6/80]
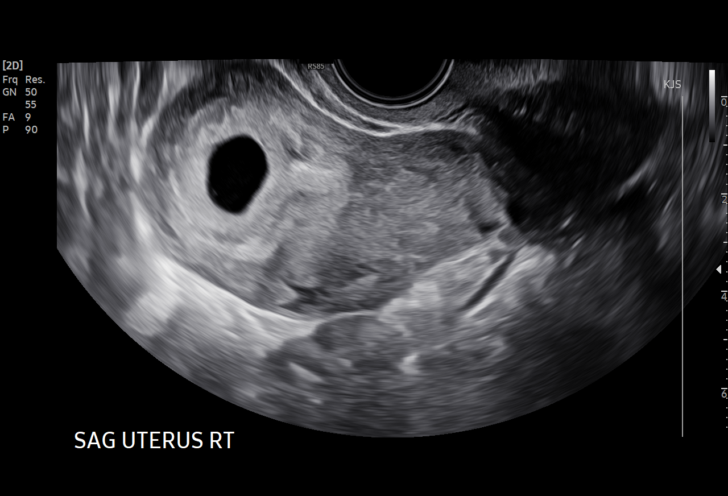
[im 12/80]
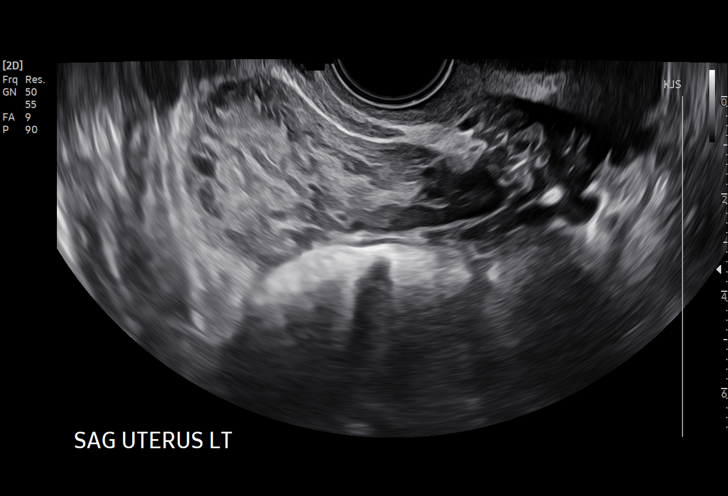
[im 18/80]
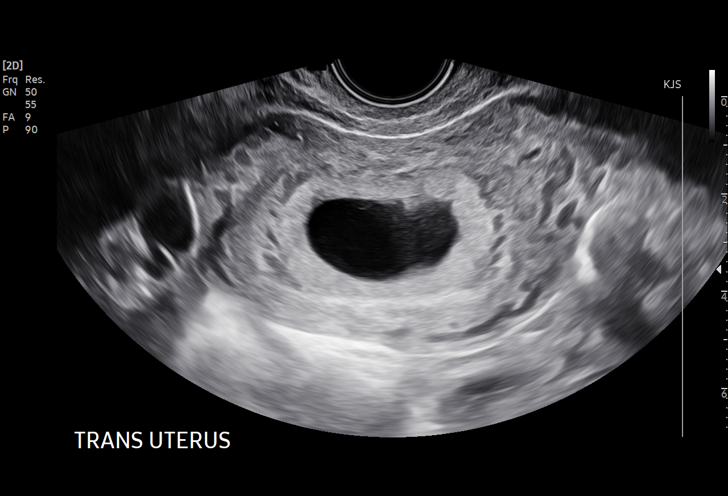
[im 24/80]
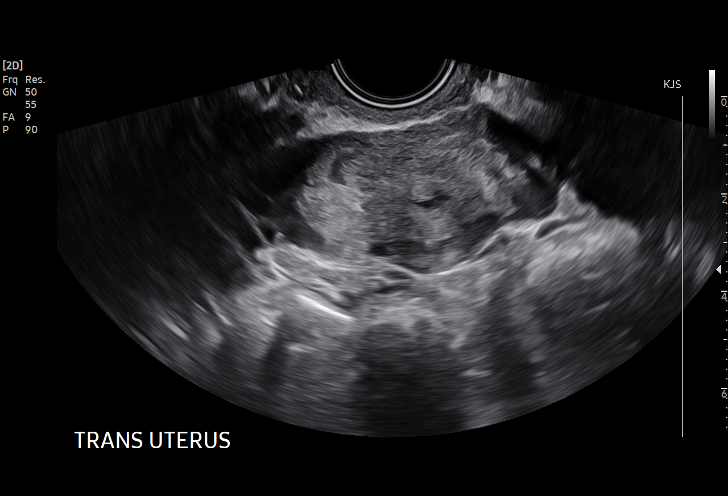
[im 30/80]
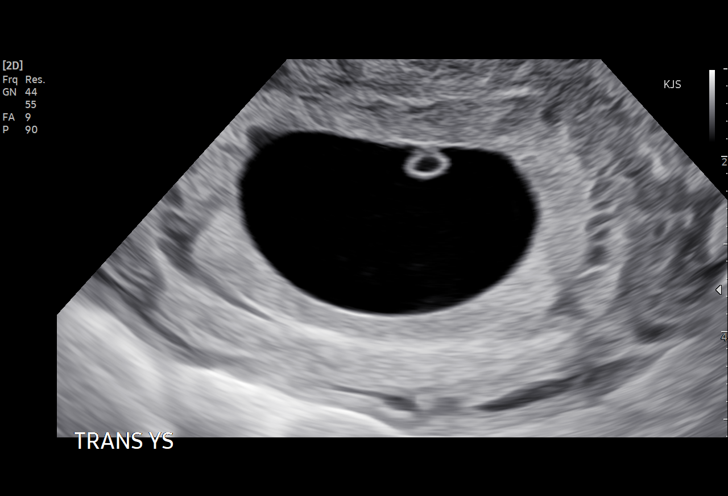
[im 36/80]
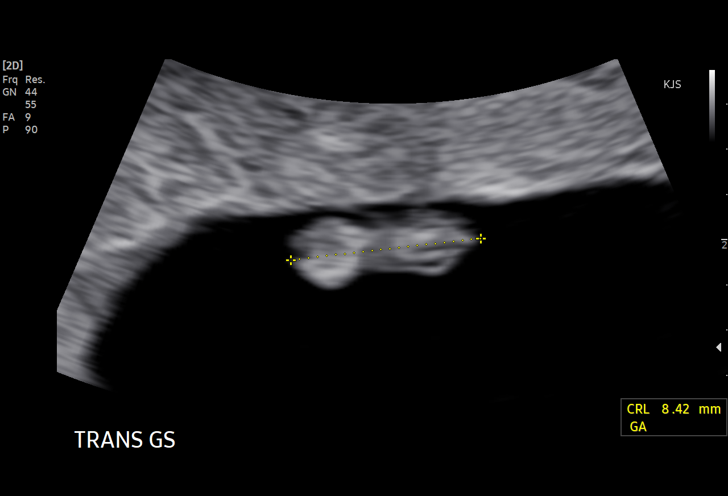
[im 41/80]
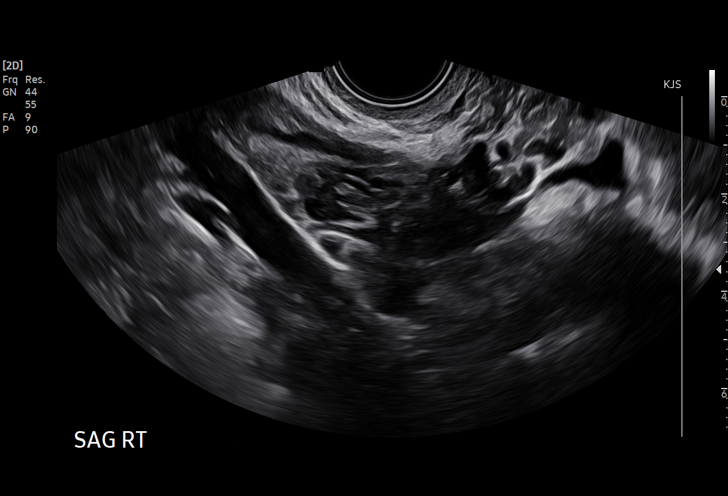
[im 44/80]
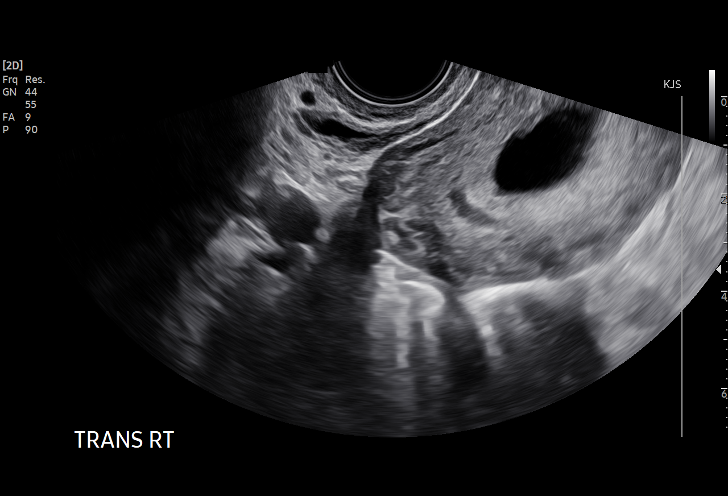
[im 50/80]
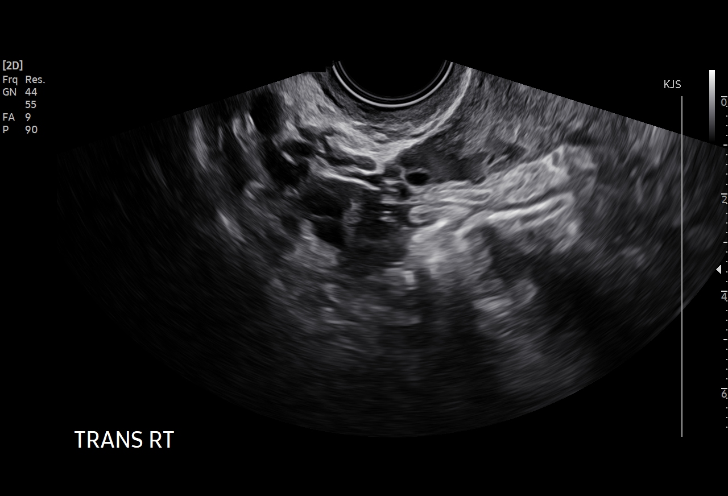
[im 56/80]
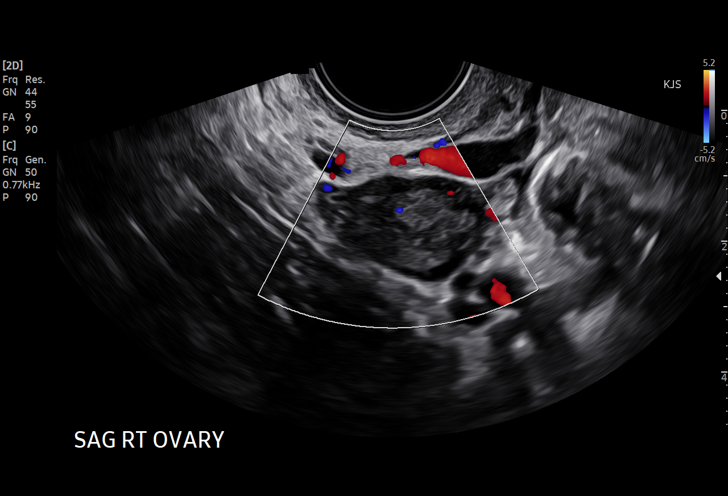
[im 62/80]
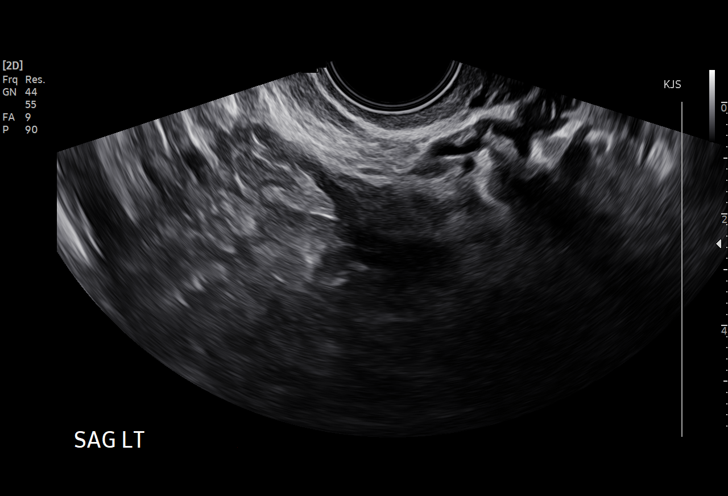
[im 68/80]
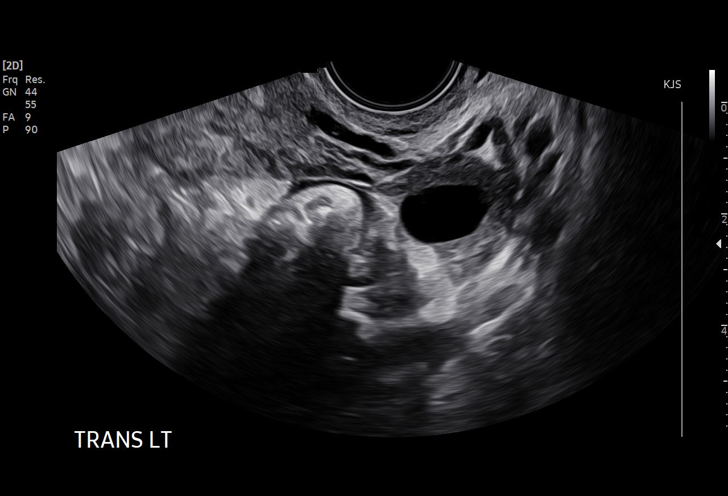
[im 74/80]
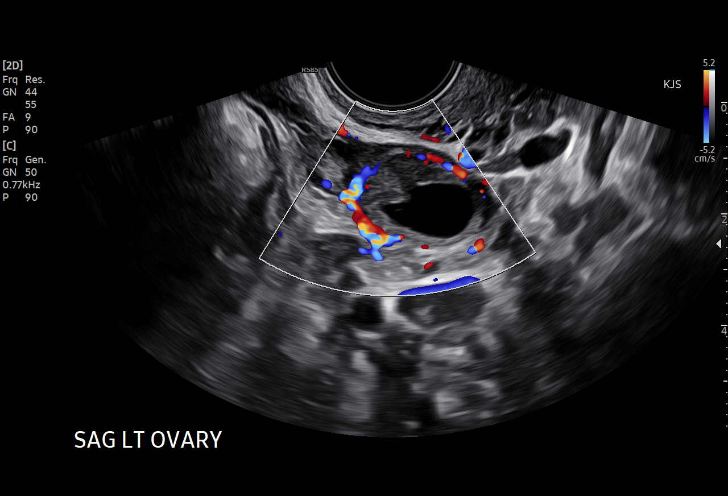
[im 80/80]
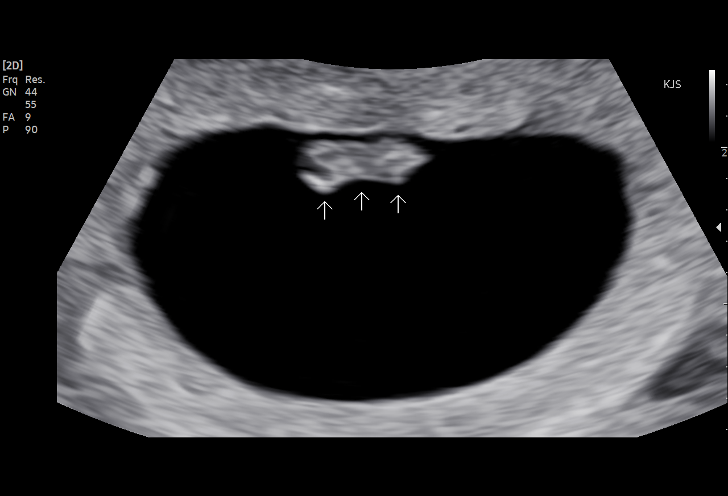

[15 of 28 positions shown; findings below may reference images not displayed]

FINDINGS: Intrauterine gestational sac: Single

Yolk sac:  Visualized.

Embryo:  Visualized.

Cardiac Activity: Visualized.

Heart Rate: 121 bpm

CRL:   8.5 mm   6 w 6 d                  US EDC: 04/26/2022

Subchorionic hemorrhage:  None visualized.

Maternal uterus/adnexae: Left ovarian corpus luteum.
IMPRESSION: Single intrauterine gestation at sonographic gestational age of 6
weeks, 6 days. Fetal heart rate 121 beats per minute. EDD
04/26/2022.

## 2023-09-26 ENCOUNTER — Emergency Department (HOSPITAL_COMMUNITY)
Admission: EM | Admit: 2023-09-26 | Discharge: 2023-09-26 | Disposition: A | Discharge status: Home / Self Care | Payer: Medicaid Other | Attending: Emergency Medicine | Admitting: Emergency Medicine

## 2023-09-26 ENCOUNTER — Emergency Department (HOSPITAL_COMMUNITY): Payer: Medicaid Other

## 2023-09-26 ENCOUNTER — Other Ambulatory Visit: Payer: Self-pay

## 2023-09-26 ENCOUNTER — Encounter (HOSPITAL_COMMUNITY): Payer: Self-pay

## 2023-09-26 DIAGNOSIS — N760 Acute vaginitis: Secondary | ICD-10-CM | POA: Diagnosis not present

## 2023-09-26 DIAGNOSIS — B9689 Other specified bacterial agents as the cause of diseases classified elsewhere: Secondary | ICD-10-CM | POA: Insufficient documentation

## 2023-09-26 DIAGNOSIS — R1032 Left lower quadrant pain: Secondary | ICD-10-CM

## 2023-09-26 LAB — COMPREHENSIVE METABOLIC PANEL
ALT: 16 U/L (ref 0–44)
AST: 17 U/L (ref 15–41)
Albumin: 4.3 g/dL (ref 3.5–5.0)
Alkaline Phosphatase: 89 U/L (ref 38–126)
Anion gap: 9 (ref 5–15)
BUN: 12 mg/dL (ref 6–20)
CO2: 23 mmol/L (ref 22–32)
Calcium: 9.5 mg/dL (ref 8.9–10.3)
Chloride: 106 mmol/L (ref 98–111)
Creatinine, Ser: 0.64 mg/dL (ref 0.44–1.00)
GFR, Estimated: >60 mL/min (ref >60)
Glucose, Bld: 105 mg/dL — ABNORMAL HIGH (ref 70–99)
Potassium: 3.7 mmol/L (ref 3.5–5.1)
Sodium: 138 mmol/L (ref 135–145)
Total Bilirubin: 0.9 mg/dL (ref 0.0–1.2)
Total Protein: 7.9 g/dL (ref 6.5–8.1)

## 2023-09-26 LAB — WET PREP, GENITAL
Sperm: NONE SEEN
Trich, Wet Prep: NONE SEEN
WBC, Wet Prep HPF POC: <10 (ref <10)
Yeast Wet Prep HPF POC: NONE SEEN

## 2023-09-26 LAB — URINALYSIS, ROUTINE W REFLEX MICROSCOPIC
Bacteria, UA: NONE SEEN
Bilirubin Urine: NEGATIVE
Glucose, UA: NEGATIVE mg/dL
Ketones, ur: NEGATIVE mg/dL
Leukocytes,Ua: NEGATIVE
Nitrite: NEGATIVE
Protein, ur: NEGATIVE mg/dL
Specific Gravity, Urine: 1.025 (ref 1.005–1.030)
Squamous Epithelial / HPF: >50 /[HPF] (ref 0–5)
pH: 6 (ref 5.0–8.0)

## 2023-09-26 LAB — CBC
HCT: 39.5 % (ref 36.0–46.0)
Hemoglobin: 12.5 g/dL (ref 12.0–15.0)
MCH: 28.3 pg (ref 26.0–34.0)
MCHC: 31.6 g/dL (ref 30.0–36.0)
MCV: 89.4 fL (ref 80.0–100.0)
Platelets: 350 10*3/uL (ref 150–400)
RBC: 4.42 MIL/uL (ref 3.87–5.11)
RDW: 13.2 % (ref 11.5–15.5)
WBC: 11 10*3/uL — ABNORMAL HIGH (ref 4.0–10.5)
nRBC: 0 % (ref 0.0–0.2)

## 2023-09-26 LAB — HIV ANTIBODY (ROUTINE TESTING W REFLEX): HIV Screen 4th Generation wRfx: NONREACTIVE

## 2023-09-26 LAB — LIPASE, BLOOD: Lipase: 31 U/L (ref 11–51)

## 2023-09-26 LAB — HCG, SERUM, QUALITATIVE: Preg, Serum: NEGATIVE

## 2023-09-26 MED ORDER — METRONIDAZOLE 500 MG PO TABS: 500.0000 mg | ORAL_TABLET | Freq: Two times a day (BID) | ORAL | 0 refills | Status: AC

## 2023-09-26 MED ORDER — KETOROLAC TROMETHAMINE 15 MG/ML IJ SOLN
15.0000 mg | Freq: Once | INTRAMUSCULAR | Status: AC
  Administered 2023-09-26: 15 mg via INTRAVENOUS
  Filled 2023-09-26: qty 1

## 2023-09-26 MED ORDER — IOHEXOL 300 MG/ML  SOLN
100.0000 mL | Freq: Once | INTRAMUSCULAR | Status: AC | PRN
  Administered 2023-09-26: 100 mL via INTRAVENOUS

## 2023-09-26 NOTE — ED Triage Notes (Addendum)
 Patient has has left lower abdominal pain since Wednesday. No vomiting or diarrhea. Last time she felt this it was bacterial vaginosis. Is also having white vaginal discharge with odor. Last period 1/24.

## 2023-09-26 NOTE — ED Provider Notes (Signed)
 [place] EMERGENCY DEPARTMENT AT [facility] Provider Note   CSN: [id number] Arrival date & time: 09/26/23  0756     History  Chief Complaint  Patient presents with   Abdominal Pain    Nicole Howard is a 33 y.o. female.  34 year old female with history of trichomonas infection and bilateral salpingectomy 1 year ago who presents emergency department with left lower quadrant abdominal pain.  Few days ago patient she started having cramping left lower quadrant abdominal pain.  Constant.  5/10 in severity.  No exacerbating or alleviating factors including having bowel movements physician.  Has had white discharge which she states is normal for her normal.  Sexually active.  Low concern for STIs at this time per the patient.  Denies any fevers, nausea or vomiting, dysuria, frequency, diarrhea.       Home Medications Prior to Admission medications   Medication Sig Start Date End Date Taking? Authorizing Provider  metroNIDAZOLE  (FLAGYL ) 500 MG tablet Take 1 tablet (500 mg total) by mouth 2 (two) times daily. 09/26/23  Yes Yolande Lamar BROCKS, MD  acetaminophen  (TYLENOL ) 325 MG tablet Take 2 tablets (650 mg total) by mouth every 4 (four) hours as needed (for pain scale < 4). 05/04/22   Grice, Vivian B, CNM  cholecalciferol (VITAMIN D3) 25 MCG (1000 UNIT) tablet Take 1,000 Units by mouth once a week.    [provider]  ibuprofen  (ADVIL ) 600 MG tablet Take 1 tablet (600 mg total) by mouth every 6 (six) hours as needed. 08/09/22   Henry Slough, MD  oxyCODONE  (OXY IR/ROXICODONE ) 5 MG immediate release tablet Take 1 tablet (5 mg total) by mouth every 6 (six) hours as needed for breakthrough pain or severe pain (for pain score of 1-4). 08/09/22   Henry Slough, MD      Allergies    Tramadol    Review of Systems   Review of Systems  Physical Exam Updated Vital Signs BP 122/80   Pulse 78   Temp 98 F (36.7 C)   Resp 15   Ht 5' 5 (1.651 m)   Wt 56.7 kg    SpO2 100%   BMI 20.80 kg/m  Physical Exam Vitals and nursing note reviewed.  Constitutional:      General: She is not in acute distress.    Appearance: She is well-developed.  HENT:     Head: Normocephalic and atraumatic.     Right Ear: External ear normal.     Left Ear: External ear normal.     Nose: Nose normal.  Eyes:     Extraocular Movements: Extraocular movements intact.     Conjunctiva/sclera: Conjunctivae normal.     Pupils: Pupils are equal, round, and reactive to light.  Pulmonary:     Effort: Pulmonary effort is normal. No respiratory distress.  Abdominal:     General: Abdomen is flat. There is no distension.     Palpations: Abdomen is soft. There is no mass.     Tenderness: There is abdominal tenderness (Left lower quadrant). There is no guarding.  Musculoskeletal:     Cervical back: Normal range of motion and neck supple.  Skin:    General: Skin is warm and dry.  Neurological:     Mental Status: She is alert and oriented to person, place, and time. Mental status is at baseline.  Psychiatric:        Mood and Affect: Mood normal.     ED Results / Procedures / Treatments  Labs (all labs ordered are listed, but only abnormal results are displayed) Labs Reviewed  WET PREP, GENITAL - Abnormal; Notable for the following components:      Result Value   Clue Cells Wet Prep HPF POC PRESENT (*)    All other components within normal limits  COMPREHENSIVE METABOLIC PANEL - Abnormal; Notable for the following components:   Glucose, Bld 105 (*)    All other components within normal limits  CBC - Abnormal; Notable for the following components:   WBC 11.0 (*)    All other components within normal limits  URINALYSIS, ROUTINE W REFLEX MICROSCOPIC - Abnormal; Notable for the following components:   APPearance CLOUDY (*)    Hgb urine dipstick SMALL (*)    All other components within normal limits  LIPASE, BLOOD  HCG, SERUM, QUALITATIVE  HIV ANTIBODY (ROUTINE TESTING W  REFLEX)  RPR  GC/CHLAMYDIA PROBE AMP ([place]) NOT AT [facility]    EKG None  Radiology CT ABDOMEN PELVIS W CONTRAST Result Date: 09/26/2023 CLINICAL DATA:  Left lower quadrant abdominal pain. EXAM: CT ABDOMEN AND PELVIS WITH CONTRAST TECHNIQUE: Multidetector CT imaging of the abdomen and pelvis was performed using the standard protocol following bolus administration of intravenous contrast. RADIATION DOSE REDUCTION: This exam was performed according to the departmental dose-optimization program which includes automated exposure control, adjustment of the mA and/or kV according to patient size and/or use of iterative reconstruction technique. CONTRAST:  OMNIPAQUE  IOHEXOL  300 MG/ML  SOLN COMPARISON:  01/04/2017 FINDINGS: Lower chest: No acute abnormality. Hepatobiliary: Stable subcentimeter benign hepatic cysts. No gallstones, gallbladder wall thickening, or biliary dilatation. Pancreas: Unremarkable. No pancreatic ductal dilatation or surrounding inflammatory changes. Spleen: Normal in size without focal abnormality. Adrenals/Urinary Tract: Adrenal glands are unremarkable. Kidneys are normal, without renal calculi, focal lesion, or hydronephrosis. Bladder is unremarkable. Stomach/Bowel: Bowel shows no evidence of obstruction, ileus, inflammation or lesion. The appendix is not discretely visualized. No free intraperitoneal air. Vascular/Lymphatic: No significant vascular findings are present. No enlarged abdominal or pelvic lymph nodes. Reproductive: Fluid in the endometrial cavity may reflect current menses. No adnexal masses identified. Other: No abdominal wall hernia or abnormality. No abdominopelvic ascites. Musculoskeletal: No acute or significant osseous findings. IMPRESSION: 1. No acute findings in the abdomen or pelvis. 2. Stable subcentimeter benign hepatic cysts. 3. Fluid in the endometrial cavity may reflect current menses. Electronically Signed   By: Marcey Moan M.D.   On: 09/26/2023  12:10    Procedures Procedures    Medications Ordered in ED Medications  ketorolac  (TORADOL ) 15 MG/ML injection 15 mg (15 mg Intravenous Given 09/26/23 1110)  iohexol  (OMNIPAQUE ) 300 MG/ML solution 100 mL (100 mLs Intravenous Contrast Given 09/26/23 1052)    ED Course/ Medical Decision Making/ A&P                                 Medical Decision Making Amount and/or Complexity of Data Reviewed Labs: ordered. Radiology: ordered.  Risk Prescription drug management.   Nicole Howard is a 33 y.o. female with comorbidities that complicate the patient evaluation including trichomonas infection and bilateral salpingectomy 1 year ago who presents emergency department with left lower quadrant abdominal pain.   Initial Ddx:  Diverticulitis, ovarian pathology/cyst, PID, STI, kidney stone  MDM/Course:  Patient resents emergency department with left lower quadrant abdominal pain.  Does have a history of bilateral salpingectomy.  Is also having some vaginal discharge.  On  exam does have left lower quadrant tenderness to palpation.  She had a CT scan obtained that did not show evidence of diverticulitis or ovarian pathology such as an ovarian cyst or edema that would suggest torsion.  No kidney stone.  Did consider PID but patient has had a salpingectomy so ascending infection would be highly unlikely.  Patient did test positive for BV.  Will go ahead and treat her with Flagyl  at this time but do not feel that empiric treatment for PID is warranted because of the salpingectomy.  Patient did have STI testing was sent and all resolved with positive.  This patient presents to the ED for concern of complaints listed in HPI, this involves an extensive number of treatment options, and is a complaint that carries with it a high risk of complications and morbidity. Disposition including potential need for admission considered.   Dispo: DC Home. Return precautions discussed including, but not limited to,  those listed in the AVS. Allowed pt time to ask questions which were answered fully prior to dc.  Records reviewed Outpatient Clinic Notes The following labs were independently interpreted: Urinalysis and show no acute abnormality I independently reviewed the following imaging with scope of interpretation limited to determining acute life threatening conditions related to emergency care: CT Abdomen/Pelvis and agree with the radiologist interpretation with the following exceptions: none I personally reviewed and interpreted cardiac monitoring: normal sinus rhythm  I personally reviewed and interpreted the pt's EKG: see above for interpretation  I have reviewed the patients home medications and made adjustments as needed  Portions of this note were generated with Dragon dictation software. Dictation errors may occur despite best attempts at proofreading.     Final Clinical Impression(s) / ED Diagnoses Final diagnoses:  Left lower quadrant pain  BV (bacterial vaginosis)    Rx / DC Orders ED Discharge Orders          Ordered    metroNIDAZOLE  (FLAGYL ) 500 MG tablet  2 times daily        09/26/23 1341              Yolande Lamar BROCKS, MD 09/26/23 2025

## 2023-09-26 NOTE — ED Notes (Signed)
 IV access attempted x3. Each time, I got blood return, however the catheter would not advance. Pt taken to CT.

## 2023-09-26 NOTE — Discharge Instructions (Addendum)
 You were seen for your bacterial vaginosis infection in the emergency department.   At home, please take antibiotics you are prescribed.    Check your MyChart online for the results of any tests that had not resulted by the time you left the emergency department.   Follow-up with your gynecology or primary doctor in 2-3 days regarding your visit.    Return immediately to the emergency department if you experience any of the following: Worsening pain, fevers, or any other concerning symptoms.    Thank you for visiting our Emergency Department. It was a pleasure taking care of you today.

## 2023-09-27 LAB — RPR: RPR Ser Ql: NONREACTIVE

## 2023-09-29 LAB — GC/CHLAMYDIA PROBE AMP (~~LOC~~) NOT AT ARMC
Chlamydia: NEGATIVE
Comment: NEGATIVE
Comment: NORMAL
Neisseria Gonorrhea: NEGATIVE

## 2023-11-25 ENCOUNTER — Ambulatory Visit
Admission: RE | Admit: 2023-11-25 | Discharge: 2023-11-25 | Disposition: A | Discharge status: Home / Self Care | Source: Ambulatory Visit | Attending: Family Medicine | Admitting: Family Medicine

## 2023-11-25 VITALS — BP 144/107 | HR 81 | Temp 99.7°F | Resp 18

## 2023-11-25 DIAGNOSIS — J988 Other specified respiratory disorders: Secondary | ICD-10-CM

## 2023-11-25 DIAGNOSIS — B9789 Other viral agents as the cause of diseases classified elsewhere: Secondary | ICD-10-CM

## 2023-11-25 DIAGNOSIS — J040 Acute laryngitis: Secondary | ICD-10-CM

## 2023-11-25 LAB — POC COVID19/FLU A&B COMBO
Covid Antigen, POC: NEGATIVE
Influenza A Antigen, POC: NEGATIVE
Influenza B Antigen, POC: NEGATIVE

## 2023-11-25 MED ORDER — CETIRIZINE HCL 10 MG PO TABS: 10.0000 mg | ORAL_TABLET | Freq: Every day | ORAL | 0 refills | Status: AC

## 2023-11-25 MED ORDER — PROMETHAZINE-DM 6.25-15 MG/5ML PO SYRP: 5.0000 mL | ORAL_SOLUTION | Freq: Three times a day (TID) | ORAL | 0 refills | Status: AC | PRN

## 2023-11-25 MED ORDER — DEXAMETHASONE SODIUM PHOSPHATE 10 MG/ML IJ SOLN
10.0000 mg | Freq: Once | INTRAMUSCULAR | Status: AC
  Administered 2023-11-25: 10 mg via INTRAMUSCULAR

## 2023-11-25 MED ORDER — PSEUDOEPHEDRINE HCL 30 MG PO TABS: 30.0000 mg | ORAL_TABLET | Freq: Three times a day (TID) | ORAL | 0 refills | Status: AC | PRN

## 2023-11-25 NOTE — ED Triage Notes (Signed)
 Patient reports hoarse voice and cough x 3 days.  Home Intervention: None

## 2023-11-25 NOTE — ED Provider Notes (Signed)
 [facility]  Note:  This document was prepared using Conservation officer, historic buildings and may include unintentional dictation errors.  MRN: [id number] DOB: 05/31/1991  Subjective:   Nicole Howard is a 33 y.o. female presenting for 3 day history of acute onset hoarseness, coughing, throat pain, malaise and fatigue. No history of asthma. No smoking of any kind including cigarettes, cigars, vaping, marijuana use.  No chest pain, shob, wheezing, body aches, rashes. No chronic medications.     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   POST D&E FOR ABORTION AT [redacted]WK GESTATION COMPLICATED BY LARGE BLOOD LOSS,  PT RECEIVED BLOOD AND PLASMA TRANSFUSIONS   IDA (iron deficiency anemia)      Past Surgical History:  Procedure Laterality Date   DILATION AND CURETTAGE OF UTERUS  10/14/2020   @[facility];   W/ SUCTION SECONDARY TO  HEMORRHAGE/ DIC,  POST D&E FOR ABORTION [redacted] [facility] GESTATION (AT PLANNED PARENTHOOD)   FOREIGN BODY REMOVAL Left 09/18/2015   Procedure: FOREIGN BODY REMOVAL OF BIRTH CONTROL DEVICE IN LEFT ARM ADULT;  Surgeon: Luretha Murphy, MD;  Location: [facility];  Service: General;  Laterality: Left;   LAPAROSCOPIC BILATERAL SALPINGECTOMY Bilateral 08/09/2022   Procedure: LAPAROSCOPIC BILATERAL SALPINGECTOMY;  Surgeon: Osborn Coho, MD;  Location: [facility];  Service: Gynecology;  Laterality: Bilateral;    Family History  Adopted: Yes  Problem Relation Age of Onset   Other Neg Hx     Social History   Tobacco Use   Smoking status: Never   Smokeless tobacco: Never  Vaping Use   Vaping status: Never Used  Substance Use Topics   Alcohol use: No   Drug use: No    ROS   Objective:   Vitals: BP (!) 144/107 (BP Location: Left Arm)   Pulse 81   Temp 99.7 F (37.6 C) (Oral)   Resp 18   LMP  11/01/2023   SpO2 99%   Physical Exam Constitutional:      General: She is not in acute distress.    Appearance: Normal appearance. She is well-developed and normal weight. She is not ill-appearing, toxic-appearing or diaphoretic.  HENT:     Head: Normocephalic and atraumatic.     Right Ear: Tympanic membrane, ear canal and external ear normal. No drainage or tenderness. No middle ear effusion. There is no impacted cerumen. Tympanic membrane is not erythematous or bulging.     Left Ear: Tympanic membrane, ear canal and external ear normal. No drainage or tenderness.  No middle ear effusion. There is no impacted cerumen. Tympanic membrane is not erythematous or bulging.     Nose: Nose normal. No congestion or rhinorrhea.     Mouth/Throat:     Mouth: Mucous membranes are moist. No oral lesions.     Pharynx: No pharyngeal swelling, oropharyngeal exudate, posterior oropharyngeal erythema or uvula swelling.     Tonsils: No tonsillar exudate or tonsillar abscesses.  Eyes:     General: No scleral icterus.       Right eye: No discharge.        Left eye: No discharge.     Extraocular Movements: Extraocular movements intact.     Right eye: Normal extraocular motion.     Left eye: Normal extraocular motion.     Conjunctiva/sclera: Conjunctivae normal.  Cardiovascular:  Rate and Rhythm: Normal rate and regular rhythm.     Heart sounds: Normal heart sounds. No murmur heard.    No friction rub. No gallop.  Pulmonary:     Effort: Pulmonary effort is normal. No respiratory distress.     Breath sounds: No stridor. No wheezing, rhonchi or rales.  Chest:     Chest wall: No tenderness.  Musculoskeletal:     Cervical back: Normal range of motion and neck supple.  Lymphadenopathy:     Cervical: No cervical adenopathy.  Skin:    General: Skin is warm and dry.  Neurological:     General: No focal deficit present.     Mental Status: She is alert and oriented to person, place, and time.   Psychiatric:        Mood and Affect: Mood normal.        Behavior: Behavior normal.    Results for orders placed or performed during the hospital encounter of 11/25/23 (from the past 24 hours)  POC Covid19/Flu A&B Antigen     Status: None   Collection Time: 11/25/23 11:10 AM  Result Value Ref Range   Influenza A Antigen, POC Negative Negative   Influenza B Antigen, POC Negative Negative   Covid Antigen, POC Negative Negative    IM dexamethasone 10mg  administered in clinic.   Assessment and Plan :   PDMP not reviewed this encounter.  1. Viral respiratory infection   2. Laryngitis     IM dexa as above for laryngitis, respiratory symptoms.  Otherwise, suspect viral URI, viral syndrome. Physical exam findings reassuring and vital signs stable for discharge. Advised supportive care, offered symptomatic relief. Counseled patient on potential for adverse effects with medications prescribed/recommended today, ER and return-to-clinic precautions discussed, patient verbalized understanding.     Wallis Bamberg, [place] 11/25/23 1135

## 2023-11-25 NOTE — Discharge Instructions (Signed)
 Will call with COVID flu test results later today. Otherwise, for now, we will manage this as a viral respiratory infection. You received a steroid injection for you laryngitis. For sore throat or cough try using a honey-based tea. Use 3 teaspoons of honey with juice squeezed from half lemon. Place shaved pieces of ginger into 1/2-1 cup of water and warm over stove top. Then mix the ingredients and repeat every 4 hours as needed. Please take Tylenol 500mg -650mg  once every 6 hours for fevers, aches and pains. Hydrate very well with at least 2 liters (64 ounces) of water. Eat light meals such as soups (chicken and noodles, chicken wild rice, vegetable).  Do not eat any foods that you are allergic to.  Start an antihistamine like Zyrtec (10mg  daily) for postnasal drainage, sinus congestion.  You can take this together with pseudoephedrine (Sudafed) at a dose of 30 mg 3 times a day or twice daily as needed for the same kind of congestion. Use cough syrup as needed.
